# Patient Record
Sex: Male | Born: 1960
Health system: Southern US, Community
[De-identification: ages and names within clinical notes are randomized; demographics above are authoritative.]

## PROBLEM LIST (undated history)

## (undated) DIAGNOSIS — F1990 Other psychoactive substance use, unspecified, uncomplicated: Secondary | ICD-10-CM

## (undated) DIAGNOSIS — I10 Essential (primary) hypertension: Secondary | ICD-10-CM

## (undated) DIAGNOSIS — J9601 Acute respiratory failure with hypoxia: Secondary | ICD-10-CM

## (undated) DIAGNOSIS — R0902 Hypoxemia: Secondary | ICD-10-CM

## (undated) DIAGNOSIS — H269 Unspecified cataract: Secondary | ICD-10-CM

## (undated) DIAGNOSIS — R0600 Dyspnea, unspecified: Secondary | ICD-10-CM

## (undated) DIAGNOSIS — J449 Chronic obstructive pulmonary disease, unspecified: Secondary | ICD-10-CM

## (undated) DIAGNOSIS — J189 Pneumonia, unspecified organism: Secondary | ICD-10-CM

## (undated) DIAGNOSIS — M1990 Unspecified osteoarthritis, unspecified site: Secondary | ICD-10-CM

## (undated) DIAGNOSIS — K219 Gastro-esophageal reflux disease without esophagitis: Secondary | ICD-10-CM

## (undated) DIAGNOSIS — Z720 Tobacco use: Secondary | ICD-10-CM

## (undated) DIAGNOSIS — J439 Emphysema, unspecified: Secondary | ICD-10-CM

## (undated) DIAGNOSIS — F419 Anxiety disorder, unspecified: Secondary | ICD-10-CM

## (undated) HISTORY — DX: Unspecified cataract: H26.9

## (undated) HISTORY — DX: Emphysema, unspecified: J43.9

## (undated) HISTORY — PX: UPPER GASTROINTESTINAL ENDOSCOPY: SHX188

## (undated) HISTORY — PX: CYST EXCISION: SHX5701

## (undated) HISTORY — DX: Other psychoactive substance use, unspecified, uncomplicated: F19.90

## (undated) HISTORY — PX: ESOPHAGEAL DILATION: SHX303

## (undated) HISTORY — DX: Hypoxemia: R09.02

## (undated) HISTORY — PX: OTHER SURGICAL HISTORY: SHX169

## (undated) HISTORY — DX: Gastro-esophageal reflux disease without esophagitis: K21.9

## (undated) HISTORY — PX: ROTATOR CUFF REPAIR: SHX139

## (undated) HISTORY — DX: Pneumonia, unspecified organism: J18.9

## (undated) HISTORY — DX: Tobacco use: Z72.0

## (undated) HISTORY — DX: Acute respiratory failure with hypoxia: J96.01

## (undated) HISTORY — DX: Essential (primary) hypertension: I10

## (undated) HISTORY — PX: BACK SURGERY: SHX140

## (undated) HISTORY — DX: Anxiety disorder, unspecified: F41.9

---

## 2019-01-17 DIAGNOSIS — J441 Chronic obstructive pulmonary disease with (acute) exacerbation: Secondary | ICD-10-CM | POA: Diagnosis not present

## 2019-01-17 DIAGNOSIS — R05 Cough: Secondary | ICD-10-CM | POA: Diagnosis not present

## 2019-01-25 DIAGNOSIS — R0602 Shortness of breath: Secondary | ICD-10-CM | POA: Diagnosis not present

## 2019-01-25 DIAGNOSIS — F1990 Other psychoactive substance use, unspecified, uncomplicated: Secondary | ICD-10-CM | POA: Diagnosis not present

## 2019-01-25 DIAGNOSIS — J189 Pneumonia, unspecified organism: Secondary | ICD-10-CM | POA: Diagnosis not present

## 2019-01-25 DIAGNOSIS — J441 Chronic obstructive pulmonary disease with (acute) exacerbation: Secondary | ICD-10-CM | POA: Diagnosis not present

## 2019-01-25 DIAGNOSIS — R590 Localized enlarged lymph nodes: Secondary | ICD-10-CM | POA: Diagnosis not present

## 2019-01-25 DIAGNOSIS — J9601 Acute respiratory failure with hypoxia: Secondary | ICD-10-CM | POA: Diagnosis not present

## 2019-01-25 DIAGNOSIS — R0902 Hypoxemia: Secondary | ICD-10-CM | POA: Diagnosis not present

## 2019-01-25 DIAGNOSIS — K228 Other specified diseases of esophagus: Secondary | ICD-10-CM | POA: Diagnosis not present

## 2019-01-25 DIAGNOSIS — J440 Chronic obstructive pulmonary disease with acute lower respiratory infection: Secondary | ICD-10-CM | POA: Diagnosis not present

## 2019-01-25 DIAGNOSIS — F17210 Nicotine dependence, cigarettes, uncomplicated: Secondary | ICD-10-CM | POA: Diagnosis not present

## 2019-01-25 DIAGNOSIS — R918 Other nonspecific abnormal finding of lung field: Secondary | ICD-10-CM | POA: Diagnosis not present

## 2019-01-25 DIAGNOSIS — Z23 Encounter for immunization: Secondary | ICD-10-CM | POA: Diagnosis not present

## 2019-01-25 DIAGNOSIS — Z720 Tobacco use: Secondary | ICD-10-CM | POA: Diagnosis not present

## 2019-01-26 DIAGNOSIS — Z720 Tobacco use: Secondary | ICD-10-CM | POA: Diagnosis not present

## 2019-01-26 DIAGNOSIS — J189 Pneumonia, unspecified organism: Secondary | ICD-10-CM | POA: Diagnosis not present

## 2019-01-26 DIAGNOSIS — J9601 Acute respiratory failure with hypoxia: Secondary | ICD-10-CM | POA: Diagnosis not present

## 2019-01-26 DIAGNOSIS — F1990 Other psychoactive substance use, unspecified, uncomplicated: Secondary | ICD-10-CM | POA: Diagnosis not present

## 2019-01-27 DIAGNOSIS — Z720 Tobacco use: Secondary | ICD-10-CM | POA: Diagnosis not present

## 2019-01-27 DIAGNOSIS — F1990 Other psychoactive substance use, unspecified, uncomplicated: Secondary | ICD-10-CM | POA: Diagnosis not present

## 2019-01-27 DIAGNOSIS — J189 Pneumonia, unspecified organism: Secondary | ICD-10-CM | POA: Diagnosis not present

## 2019-01-27 DIAGNOSIS — J9601 Acute respiratory failure with hypoxia: Secondary | ICD-10-CM | POA: Diagnosis not present

## 2019-01-28 DIAGNOSIS — J9601 Acute respiratory failure with hypoxia: Secondary | ICD-10-CM | POA: Diagnosis not present

## 2019-01-28 DIAGNOSIS — J189 Pneumonia, unspecified organism: Secondary | ICD-10-CM | POA: Diagnosis not present

## 2019-01-28 DIAGNOSIS — F1990 Other psychoactive substance use, unspecified, uncomplicated: Secondary | ICD-10-CM | POA: Diagnosis not present

## 2019-01-28 DIAGNOSIS — Z720 Tobacco use: Secondary | ICD-10-CM | POA: Diagnosis not present

## 2019-02-03 ENCOUNTER — Ambulatory Visit: Payer: BLUE CROSS/BLUE SHIELD | Admitting: Emergency Medicine

## 2019-02-03 ENCOUNTER — Encounter: Payer: Self-pay | Admitting: *Deleted

## 2019-02-03 VITALS — BP 118/70 | HR 91 | Ht 71.0 in | Wt 131.8 lb

## 2019-02-03 DIAGNOSIS — Z720 Tobacco use: Secondary | ICD-10-CM | POA: Insufficient documentation

## 2019-02-03 DIAGNOSIS — R590 Localized enlarged lymph nodes: Secondary | ICD-10-CM

## 2019-02-03 DIAGNOSIS — J432 Centrilobular emphysema: Secondary | ICD-10-CM | POA: Insufficient documentation

## 2019-02-03 NOTE — H&P (View-Only) (Signed)
Subjective:    Patient ID: Sean Hull, male    DOB: 1961/09/28, 58 y.o.   MRN: [id number]  HPI 58 year old smoker (40 pack years) with history of substance abuse (cocaine) admitted to [facility] for pneumonia between 2/22 and 2/25.  He was hypoxemic and required BiPAP support, was not intubated.  He was treated with antibiotics and improved, discharged.  A CT scan of his chest was done during that hospitalization which I have reviewed.  This shows profound emphysematous change, bilateral lower lobe predominant infiltrates consistent with either pneumonia or pneumonitis superimposed on his emphysematous disease.  Consider also atypical infection.  He had fairly bulky mediastinal lymphadenopathy principally on the left.  Question was also raised of possible esophageal lesion.  He presents today for further evaluation of his abnormal CT scan of the chest.  He still feels washed out, breathing a bit better. Cough is better. He has lost about 30 lbs in the last year. He has decreased his cigarettes. He has some dysphagia, sometimes food gets stuck. Sometimes hurts to swallow liquids.    Review of Systems  Constitutional: Positive for unexpected weight change. Negative for fever.  HENT: Positive for congestion, sore throat and trouble swallowing. Negative for dental problem, ear pain, nosebleeds, postnasal drip, rhinorrhea, sinus pressure and sneezing.   Eyes: Negative for redness and itching.  Respiratory: Positive for cough and shortness of breath. Negative for chest tightness and wheezing.   Cardiovascular: Negative for palpitations and leg swelling.  Gastrointestinal: Negative for nausea and vomiting.  Genitourinary: Negative for dysuria.  Musculoskeletal: Negative for joint swelling.  Skin: Negative for rash.  Neurological: Negative for headaches.  Hematological: Does not bruise/bleed easily.  Psychiatric/Behavioral: Negative for dysphoric mood. The patient is not nervous/anxious.    Past  Medical History:  Diagnosis Date   Acute respiratory failure with hypoxia (HCC)    CAP (community acquired pneumonia)    Substance use disorder    Tobacco use      No family history on file.   Social History   Socioeconomic History   Marital status: Widowed    Spouse name: Not on file   Number of children: Not on file   Years of education: Not on file   Highest education level: Not on file  Occupational History   Not on file  Social Needs   Financial resource strain: Not on file   Food insecurity:    Worry: Not on file    Inability: Not on file   Transportation needs:    Medical: Not on file    Non-medical: Not on file  Tobacco Use   Smoking status: Current Every Day Smoker    Packs/day: 1.00    Years: 40.00    Pack years: 40.00   Smokeless tobacco: Never Used  Substance and Sexual Activity   Alcohol use: Not on file   Drug use: Yes    Types: "Crack" cocaine   Sexual activity: Not on file  Lifestyle   Physical activity:    Days per week: Not on file    Minutes per session: Not on file   Stress: Not on file  Relationships   Social connections:    Talks on phone: Not on file    Gets together: Not on file    Attends religious service: Not on file    Active member of club or organization: Not on file    Attends meetings of clubs or organizations: Not on file    Relationship status: Not  on file   Intimate partner violence:    Fear of current or ex partner: Not on file    Emotionally abused: Not on file    Physically abused: Not on file    Forced sexual activity: Not on file  Other Topics Concern   Not on file  Social History Narrative   Not on file     No Known Allergies   Outpatient Medications Prior to Visit  Medication Sig Dispense Refill   albuterol (PROVENTIL) (5 MG/ML) 0.5% nebulizer solution DRAW 0.5ML AND INHALE VIA NEBULIZER Q 8 H     amitriptyline (ELAVIL) 100 MG tablet TAKE 1 TO 2 TABLETS BY MOUTH EVERY NIGHTAT BEDTIME      budesonide (PULMICORT) 0.5 MG/2ML nebulizer solution USE 1 VIAL VIA NEB BID FOR 30 DAYS     doxycycline (VIBRAMYCIN) 100 MG capsule      ipratropium-albuterol (DUONEB) 0.5-2.5 (3) MG/3ML SOLN INHALE THE CONTENT OF 1 VIAL VIA NEBULIZER Q 6 H     predniSONE (DELTASONE) 20 MG tablet      PROAIR HFA 108 (90 Base) MCG/ACT inhaler      No facility-administered medications prior to visit.         Objective:   Physical Exam  Vitals:   02/03/19 1507  BP: 118/70  Pulse: 91  SpO2: 97%  Weight: 131 lb 12.8 oz (59.8 kg)  Height: 5\' 11"  (1.803 m)   Gen: Pleasant, very thin, in no distress,  normal affect  ENT: No lesions,  mouth clear,  Some white exudate on uvula, no postnasal drip  Neck: No JVD, no stridor  Lungs: No use of accessory muscles, distant, no wheeze  Cardiovascular: RRR, heart sounds normal, no murmur or gallops, no peripheral edema  Musculoskeletal: No deformities, no cyanosis or clubbing  Neuro: alert, awake, non focal  Skin: Warm, no lesions or rash     Assessment & Plan:  Tobacco abuse Discussed cessation with him today  Mediastinal lymphadenopathy New observation on his recent CT scan of the chest from hospitalization for pneumonia.  The bilateral infiltrates could reflect bacterial pneumonia, possibly an acute lung injury since he was cocaine positive.  The lymphadenopathy appears to be out of proportion to what I would typically see with pneumonia.  I think his nodes need to be sampled with endobronchial ultrasound.  If at all get a diagnosis then I would recommend EGD by gastroenterology given the question of a possible esophageal lesion  Centrilobular emphysema (HCC) He will need pulmonary function testing going forward.  For now I will continue his scheduled DuoNeb  Levy Pupa, MD, PhD 02/03/2019, 3:34 PM [facility] [phone] or if no answer [phone]

## 2019-02-03 NOTE — Progress Notes (Signed)
° °Subjective:  ° ° Patient ID: Sean Hull, male    DOB: 11/09/1961, 58 y.o.   MRN: [id number] ° °HPI °58-year-old smoker (40 pack years) with history of substance abuse (cocaine) admitted to [place] for pneumonia between 2/22 and 2/25.  He was hypoxemic and required BiPAP support, was not intubated.  He was treated with antibiotics and improved, discharged.  A CT scan of his chest was done during that hospitalization which I have reviewed.  This shows profound emphysematous change, bilateral lower lobe predominant infiltrates consistent with either pneumonia or pneumonitis superimposed on his emphysematous disease.  Consider also atypical infection.  He had fairly bulky mediastinal lymphadenopathy principally on the left.  Question was also raised of possible esophageal lesion.  He presents today for further evaluation of his abnormal CT scan of the chest. ° °He still feels washed out, breathing a bit better. Cough is better. He has lost about 30 lbs in the last year. He has decreased his cigarettes. He has some dysphagia, sometimes food gets stuck. Sometimes hurts to swallow liquids.  ° ° °Review of Systems  °Constitutional: Positive for unexpected weight change. Negative for fever.  °HENT: Positive for congestion, sore throat and trouble swallowing. Negative for dental problem, ear pain, nosebleeds, postnasal drip, rhinorrhea, sinus pressure and sneezing.   °Eyes: Negative for redness and itching.  °Respiratory: Positive for cough and shortness of breath. Negative for chest tightness and wheezing.   °Cardiovascular: Negative for palpitations and leg swelling.  °Gastrointestinal: Negative for nausea and vomiting.  °Genitourinary: Negative for dysuria.  °Musculoskeletal: Negative for joint swelling.  °Skin: Negative for rash.  °Neurological: Negative for headaches.  °Hematological: Does not bruise/bleed easily.  °Psychiatric/Behavioral: Negative for dysphoric mood. The patient is not nervous/anxious.   ° °Past  Medical History:  °Diagnosis Date  °• Acute respiratory failure with hypoxia (HCC)   °• CAP (community acquired pneumonia)   °• Substance use disorder   °• Tobacco use   °  ° °No family history on file.  ° °Social History  ° °Socioeconomic History  °• Marital status: Widowed  °  Spouse name: Not on file  °• Number of children: Not on file  °• Years of education: Not on file  °• Highest education level: Not on file  °Occupational History  °• Not on file  °Social Needs  °• Financial resource strain: Not on file  °• Food insecurity:  °  Worry: Not on file  °  Inability: Not on file  °• Transportation needs:  °  Medical: Not on file  °  Non-medical: Not on file  °Tobacco Use  °• Smoking status: Current Every Day Smoker  °  Packs/day: 1.00  °  Years: 40.00  °  Pack years: 40.00  °• Smokeless tobacco: Never Used  °Substance and Sexual Activity  °• Alcohol use: Not on file  °• Drug use: Yes  °  Types: "Crack" cocaine  °• Sexual activity: Not on file  °Lifestyle  °• Physical activity:  °  Days per week: Not on file  °  Minutes per session: Not on file  °• Stress: Not on file  °Relationships  °• Social connections:  °  Talks on phone: Not on file  °  Gets together: Not on file  °  Attends religious service: Not on file  °  Active member of club or organization: Not on file  °  Attends meetings of clubs or organizations: Not on file  °  Relationship status: Not   on file  °• Intimate partner violence:  °  Fear of current or ex partner: Not on file  °  Emotionally abused: Not on file  °  Physically abused: Not on file  °  Forced sexual activity: Not on file  °Other Topics Concern  °• Not on file  °Social History Narrative  °• Not on file  °  ° °No Known Allergies  ° °Outpatient Medications Prior to Visit  °Medication Sig Dispense Refill  °• albuterol (PROVENTIL) (5 MG/ML) 0.5% nebulizer solution DRAW 0.5ML AND INHALE VIA NEBULIZER Q 8 H    °• amitriptyline (ELAVIL) 100 MG tablet TAKE 1 TO 2 TABLETS BY MOUTH EVERY NIGHTAT BEDTIME     °• budesonide (PULMICORT) 0.5 MG/2ML nebulizer solution USE 1 VIAL VIA NEB BID FOR 30 DAYS    °• doxycycline (VIBRAMYCIN) 100 MG capsule     °• ipratropium-albuterol (DUONEB) 0.5-2.5 (3) MG/3ML SOLN INHALE THE CONTENT OF 1 VIAL VIA NEBULIZER Q 6 H    °• predniSONE (DELTASONE) 20 MG tablet     °• PROAIR HFA 108 (90 Base) MCG/ACT inhaler     ° °No facility-administered medications prior to visit.   ° ° ° °   °Objective:  ° Physical Exam ° °Vitals:  ° 02/03/19 1507  °BP: 118/70  °Pulse: 91  °SpO2: 97%  °Weight: 131 lb 12.8 oz (59.8 kg)  °Height: 5' 11" (1.803 m)  ° °Gen: Pleasant, very thin, in no distress,  normal affect ° °ENT: No lesions,  mouth clear,  Some white exudate on uvula, no postnasal drip ° °Neck: No JVD, no stridor ° °Lungs: No use of accessory muscles, distant, no wheeze ° °Cardiovascular: RRR, heart sounds normal, no murmur or gallops, no peripheral edema ° °Musculoskeletal: No deformities, no cyanosis or clubbing ° °Neuro: alert, awake, non focal ° °Skin: Warm, no lesions or rash ° °   °Assessment & Plan:  °Tobacco abuse °Discussed cessation with him today ° °Mediastinal lymphadenopathy °New observation on his recent CT scan of the chest from hospitalization for pneumonia.  The bilateral infiltrates could reflect bacterial pneumonia, possibly an acute lung injury since he was cocaine positive.  The lymphadenopathy appears to be out of proportion to what I would typically see with pneumonia.  I think his nodes need to be sampled with endobronchial ultrasound.  If at all get a diagnosis then I would recommend EGD by gastroenterology given the question of a possible esophageal lesion ° °Centrilobular emphysema (HCC) °He will need pulmonary function testing going forward.  For now I will continue his scheduled DuoNeb ° °Ithzel Fedorchak, MD, PhD °02/03/2019, 3:34 PM °[facility] °[phone] or if no answer [phone] ° °

## 2019-02-03 NOTE — Assessment & Plan Note (Signed)
New observation on his recent CT scan of the chest from hospitalization for pneumonia.  The bilateral infiltrates could reflect bacterial pneumonia, possibly an acute lung injury since he was cocaine positive.  The lymphadenopathy appears to be out of proportion to what I would typically see with pneumonia.  I think his nodes need to be sampled with endobronchial ultrasound.  If at all get a diagnosis then I would recommend EGD by gastroenterology given the question of a possible esophageal lesion

## 2019-02-03 NOTE — Assessment & Plan Note (Signed)
Discussed cessation with him today 

## 2019-02-03 NOTE — Assessment & Plan Note (Signed)
He will need pulmonary function testing going forward.  For now I will continue his scheduled DuoNeb

## 2019-02-03 NOTE — Patient Instructions (Signed)
Please continue nebulized medication as you are currently taking it. We will arrange for a bronchoscopy with endobronchial ultrasound so that we can inspect your airways, inspect your lymph nodes and obtain samples. Work hard on decreasing your smoking.  Your ultimate goal will be to stop altogether Avoid any other inhaled exposures Follow with Dr Delton Coombes in 1 month

## 2019-02-05 DIAGNOSIS — J432 Centrilobular emphysema: Secondary | ICD-10-CM | POA: Diagnosis not present

## 2019-02-05 DIAGNOSIS — B370 Candidal stomatitis: Secondary | ICD-10-CM | POA: Diagnosis not present

## 2019-02-05 DIAGNOSIS — R590 Localized enlarged lymph nodes: Secondary | ICD-10-CM | POA: Diagnosis not present

## 2019-02-06 ENCOUNTER — Telehealth: Payer: Self-pay | Admitting: Emergency Medicine

## 2019-02-06 NOTE — Telephone Encounter (Signed)
Spoke with pt, he would like a note out for work until 03/06/2019. RB can we provide pt with note?    Patient Instructions by Leslye Peer, MD at 02/03/2019 3:00 PM  Author: Leslye Peer, MD Author Type: Physician Filed: 02/03/2019 3:30 PM  Note Status: Signed Cosign: Cosign Not Required Encounter Date: 02/03/2019  Editor: Leslye Peer, MD (Physician)    Please continue nebulized medication as you are currently taking it. We will arrange for a bronchoscopy with endobronchial ultrasound so that we can inspect your airways, inspect your lymph nodes and obtain samples. Work hard on decreasing your smoking.  Your ultimate goal will be to stop altogether Avoid any other inhaled exposures Follow with Dr Delton Coombes in 1 month

## 2019-02-07 NOTE — Pre-Procedure Instructions (Signed)
Sean Hull  02/07/2019      [id number] Rosalita Levan, [place] - [address] AT [facility] [address] [place] [address] Phone: [phone] Fax: [phone]    Your procedure is scheduled on Wednesday 02/12/2019.  Report to [facility] Admitting at 0830 A.M.  Call this number if you have problems the morning of surgery:  [phone]   Remember:  Do not eat or drink after midnight.    Take these medicines the morning of surgery with A SIP OF WATER: Albuterol (Proventil) nebulizer, if needed Budesonide (Pulmicort) nebulizer Clotrimazole (Mycelex) Ipratropium-albuterol (Duoneb) nebulizer, if needed Proair inhaler, if needed  7 days prior to surgery STOP taking any Aspirin (unless otherwise instructed by your surgeon), Aleve, Naproxen, Ibuprofen, Motrin, Advil, Goody's, BC's, all herbal medications, fish oil, and all vitamins.     Do not wear jewelry.  Do not wear lotions, powders, or colognes, or deodorant.  Men may shave face and neck.  Do not bring valuables to the hospital.  [facility] is not responsible for any belongings or valuables.  Contacts, eyeglasses, hearing aids, dentures or bridgework may not be worn into surgery.  Leave your suitcase in the car.  After surgery it may be brought to your room.  For patients admitted to the hospital, discharge time will be determined by your treatment team.  Patients discharged the day of surgery will not be allowed to drive home.   Name and phone number of your driver:    Special instructions:   [facility]- Preparing For Surgery  Before surgery, you can play an important role. Because skin is not sterile, your skin needs to be as free of germs as possible. You can reduce the number of germs on your skin by washing with CHG (chlorahexidine gluconate) Soap before surgery.  CHG is an antiseptic cleaner which kills germs and bonds with the skin to  continue killing germs even after washing.    Oral Hygiene is also important to reduce your risk of infection.  Remember - BRUSH YOUR TEETH THE MORNING OF SURGERY WITH YOUR REGULAR TOOTHPASTE  Please do not use if you have an allergy to CHG or antibacterial soaps. If your skin becomes reddened/irritated stop using the CHG.  Do not shave (including legs and underarms) for at least 48 hours prior to first CHG shower. It is OK to shave your face.  Please follow these instructions carefully.   1. Shower the NIGHT BEFORE SURGERY and the MORNING OF SURGERY with CHG.   2. If you chose to wash your hair, wash your hair first as usual with your normal shampoo.  3. After you shampoo, rinse your hair and body thoroughly to remove the shampoo.  4. Use CHG as you would any other liquid soap. You can apply CHG directly to the skin and wash gently with a scrungie or a clean washcloth.   5. Apply the CHG Soap to your body ONLY FROM THE NECK DOWN.  Do not use on open wounds or open sores. Avoid contact with your eyes, ears, mouth and genitals (private parts). Wash Face and genitals (private parts)  with your normal soap.  6. Wash thoroughly, paying special attention to the area where your surgery will be performed.  7. Thoroughly rinse your body with warm water from the neck down.  8. DO NOT shower/wash with your normal soap after using and rinsing off the CHG  Soap.  9. Pat yourself dry with a CLEAN TOWEL.  10. Wear CLEAN PAJAMAS to bed the night before surgery, wear comfortable clothes the morning of surgery  11. Place CLEAN SHEETS on your bed the night of your first shower and DO NOT SLEEP WITH PETS.    Day of Surgery: Shower as stated above. Do not apply any deodorants/lotions.  Please wear clean clothes to the hospital/surgery center.   Remember to brush your teeth WITH YOUR REGULAR TOOTHPASTE.    Please read over the following fact sheets that you were given. Pain Booklet, Coughing and  Deep Breathing and Surgical Site Infection Prevention

## 2019-02-07 NOTE — Telephone Encounter (Signed)
Pt is calling to f/u with getting his work note CB# [phone]//kob

## 2019-02-07 NOTE — Telephone Encounter (Signed)
Called and spoke with patient advised him that we are waiting on response from RB.    RB please advise, thank you.

## 2019-02-10 ENCOUNTER — Other Ambulatory Visit: Payer: Self-pay

## 2019-02-10 ENCOUNTER — Telehealth: Payer: Self-pay | Admitting: Emergency Medicine

## 2019-02-10 ENCOUNTER — Encounter (HOSPITAL_COMMUNITY): Payer: Self-pay

## 2019-02-10 ENCOUNTER — Encounter (HOSPITAL_COMMUNITY)
Admission: RE | Admit: 2019-02-10 | Discharge: 2019-02-10 | Disposition: A | Discharge status: Home / Self Care | Payer: BLUE CROSS/BLUE SHIELD | Source: Ambulatory Visit | Attending: Emergency Medicine | Admitting: Emergency Medicine

## 2019-02-10 DIAGNOSIS — Z01818 Encounter for other preprocedural examination: Secondary | ICD-10-CM | POA: Diagnosis not present

## 2019-02-10 HISTORY — DX: Dyspnea, unspecified: R06.00

## 2019-02-10 HISTORY — DX: Unspecified osteoarthritis, unspecified site: M19.90

## 2019-02-10 HISTORY — DX: Chronic obstructive pulmonary disease, unspecified: J44.9

## 2019-02-10 LAB — CBC
HCT: 44.1 % (ref 39.0–52.0)
Hemoglobin: 13.8 g/dL (ref 13.0–17.0)
MCH: 31.7 pg (ref 26.0–34.0)
MCHC: 31.3 g/dL (ref 30.0–36.0)
MCV: 101.1 fL — ABNORMAL HIGH (ref 80.0–100.0)
Platelets: 343 10*3/uL (ref 150–400)
RBC: 4.36 MIL/uL (ref 4.22–5.81)
RDW: 13.4 % (ref 11.5–15.5)
WBC: 7.9 10*3/uL (ref 4.0–10.5)
nRBC: 0 % (ref 0.0–0.2)

## 2019-02-10 LAB — COMPREHENSIVE METABOLIC PANEL
ALBUMIN: 3.4 g/dL — AB (ref 3.5–5.0)
ALT: 15 U/L (ref 0–44)
AST: 15 U/L (ref 15–41)
Alkaline Phosphatase: 71 U/L (ref 38–126)
Anion gap: 10 (ref 5–15)
BILIRUBIN TOTAL: 0.6 mg/dL (ref 0.3–1.2)
BUN: 19 mg/dL (ref 6–20)
CO2: 24 mmol/L (ref 22–32)
Calcium: 9.1 mg/dL (ref 8.9–10.3)
Chloride: 105 mmol/L (ref 98–111)
Creatinine, Ser: 0.97 mg/dL (ref 0.61–1.24)
GFR calc Af Amer: >60 mL/min (ref >60)
GFR calc non Af Amer: >60 mL/min (ref >60)
GLUCOSE: 109 mg/dL — AB (ref 70–99)
Potassium: 4.7 mmol/L (ref 3.5–5.1)
Sodium: 139 mmol/L (ref 135–145)
TOTAL PROTEIN: 7.5 g/dL (ref 6.5–8.1)

## 2019-02-10 LAB — APTT: aPTT: 31 seconds (ref 24–36)

## 2019-02-10 LAB — PROTIME-INR
INR: 1 (ref 0.8–1.2)
Prothrombin Time: 13.5 seconds (ref 11.4–15.2)

## 2019-02-10 NOTE — Progress Notes (Addendum)
PCP - i establishing one in [place], was seen by Dr Beatris Si [facility] hospitalization 01/25/2019  Cardiologist - none  Chest x-ray - requested from [facility]  EKG - 23/08/2019- patient reports that he did not have one in [place]. Blood pressure was elevated when checked 3 times in APT visit.  Blood pressure were normal in Dr Kavin Leech office and Dr Eliezer Champagne office.  I spoke with Antionette Poles, PA-C and was instructed to do an EKG.  Stress Test - denies  ECHO - denies  Cardiac Cath - denies  Sleep Study -denies  LABS- CBC PT CMP  ASA- none- I instructed patient to hold Advil  HA1C-NA Fasting Blood Sugar -NA Checks Blood Sugar ___0__ times a day  Anesthesia-Elevated blood pressure in PAT.  I requested records from [facility].  Pt denies having chest pain, sob, or fever at this time. All instructions explained to the pt, with a verbal understanding of the material. Pt agrees to go over the instructions while at home for a better understanding. The opportunity to ask questions was provided.

## 2019-02-10 NOTE — Telephone Encounter (Signed)
Called patients wife unable to reach left message to give us a call back.  

## 2019-02-10 NOTE — Telephone Encounter (Signed)
Called pt and advised message from the provider. Pt understood and verbalized understanding. Nothing further is needed.    

## 2019-02-10 NOTE — Telephone Encounter (Signed)
Pt's sister Sedalia Muta is calling back [phone]

## 2019-02-10 NOTE — Telephone Encounter (Signed)
I'm going to talk to him about this on Wednesday. Unclear to me how long he needs to be out of work.

## 2019-02-10 NOTE — Telephone Encounter (Signed)
Called and spoke with patients sister, she is requesting results. Advised that I did not see that a DPR has been signed by the patient therefore I cannot not give any information out. She will have the patient call back.

## 2019-02-10 NOTE — Progress Notes (Deleted)
PCP -  Cardiologist -  Chest x-ray -  EKG -   Stress Test -  ECHO -   Cardiac Cath -   Sleep Study -  CPAP -   Fasting Blood Sugar -  n/a Checks Blood Sugar _____ times a day-n/a  Blood Thinner Instructions: n/a Aspirin Instructions: n/a  Anesthesia review:   Patient denies shortness of breath, fever, cough and chest pain at PAT appointment  Patient verbalized understanding of instructions that were given to them at the PAT appointment. Patient was also instructed that they will need to review over the PAT instructions again at home before surgery.

## 2019-02-10 NOTE — Telephone Encounter (Signed)
Called patient unable to reach left message to give us a call back.

## 2019-02-10 NOTE — Telephone Encounter (Signed)
Pt's sister is calling back [phone]

## 2019-02-11 NOTE — Anesthesia Preprocedure Evaluation (Addendum)
Anesthesia Evaluation  Patient identified by MRN, date of birth, ID band Patient awake    Reviewed: Allergy & Precautions, NPO status , Patient's Chart, lab work & pertinent test results  Airway Mallampati: II  TM Distance: >3 FB Neck ROM: Full    Dental  (+) Dental Advisory Given   Pulmonary pneumonia, resolved, COPD,  COPD inhaler, Current Smoker,    breath sounds clear to auscultation       Cardiovascular  Rhythm:Regular Rate:Normal     Neuro/Psych    GI/Hepatic (+)     substance abuse  cocaine use,   Endo/Other    Renal/GU      Musculoskeletal  (+) Arthritis ,   Abdominal   Peds  Hematology   Anesthesia Other Findings   Reproductive/Obstetrics                           Anesthesia Physical Anesthesia Plan  ASA: III  Anesthesia Plan: General   Post-op Pain Management:    Induction: Intravenous  PONV Risk Score and Plan: 2 and Treatment may vary due to age or medical condition and Ondansetron  Airway Management Planned: Oral ETT  Additional Equipment: None  Intra-op Plan:   Post-operative Plan: Possible Post-op intubation/ventilation  Informed Consent: I have reviewed the patients History and Physical, chart, labs and discussed the procedure including the risks, benefits and alternatives for the proposed anesthesia with the patient or authorized representative who has indicated his/her understanding and acceptance.     Dental advisory given  Plan Discussed with: CRNA and Anesthesiologist  Anesthesia Plan Comments:       Anesthesia Quick Evaluation

## 2019-02-12 ENCOUNTER — Ambulatory Visit (HOSPITAL_COMMUNITY): Payer: BLUE CROSS/BLUE SHIELD | Admitting: Certified Registered Nurse Anesthetist

## 2019-02-12 ENCOUNTER — Ambulatory Visit (HOSPITAL_COMMUNITY)
Admission: RE | Admit: 2019-02-12 | Discharge: 2019-02-12 | Disposition: A | Discharge status: Home / Self Care | Payer: BLUE CROSS/BLUE SHIELD | Attending: Emergency Medicine | Admitting: Emergency Medicine

## 2019-02-12 ENCOUNTER — Encounter (HOSPITAL_COMMUNITY): Payer: Self-pay | Admitting: Certified Registered Nurse Anesthetist

## 2019-02-12 ENCOUNTER — Other Ambulatory Visit: Payer: Self-pay

## 2019-02-12 ENCOUNTER — Encounter (HOSPITAL_COMMUNITY)
Admission: RE | Disposition: A | Discharge status: Home / Self Care | Payer: Self-pay | Source: Home / Self Care | Attending: Emergency Medicine

## 2019-02-12 ENCOUNTER — Ambulatory Visit (HOSPITAL_COMMUNITY): Payer: BLUE CROSS/BLUE SHIELD | Admitting: Physician Assistant

## 2019-02-12 DIAGNOSIS — R1310 Dysphagia, unspecified: Secondary | ICD-10-CM | POA: Insufficient documentation

## 2019-02-12 DIAGNOSIS — F17210 Nicotine dependence, cigarettes, uncomplicated: Secondary | ICD-10-CM | POA: Diagnosis not present

## 2019-02-12 DIAGNOSIS — R590 Localized enlarged lymph nodes: Secondary | ICD-10-CM

## 2019-02-12 DIAGNOSIS — Z7952 Long term (current) use of systemic steroids: Secondary | ICD-10-CM | POA: Insufficient documentation

## 2019-02-12 DIAGNOSIS — Z79899 Other long term (current) drug therapy: Secondary | ICD-10-CM | POA: Diagnosis not present

## 2019-02-12 DIAGNOSIS — R591 Generalized enlarged lymph nodes: Secondary | ICD-10-CM | POA: Diagnosis not present

## 2019-02-12 DIAGNOSIS — R918 Other nonspecific abnormal finding of lung field: Secondary | ICD-10-CM | POA: Diagnosis not present

## 2019-02-12 DIAGNOSIS — J432 Centrilobular emphysema: Secondary | ICD-10-CM | POA: Insufficient documentation

## 2019-02-12 DIAGNOSIS — R4702 Dysphasia: Secondary | ICD-10-CM | POA: Diagnosis not present

## 2019-02-12 HISTORY — PX: VIDEO BRONCHOSCOPY WITH ENDOBRONCHIAL ULTRASOUND: SHX6177

## 2019-02-12 SURGERY — BRONCHOSCOPY, WITH EBUS
Anesthesia: General | Site: Chest

## 2019-02-12 MED ORDER — ONDANSETRON HCL 4 MG/2ML IJ SOLN
INTRAMUSCULAR | Status: DC | PRN
  Administered 2019-02-12: 4 mg via INTRAVENOUS

## 2019-02-12 MED ORDER — ONDANSETRON HCL 4 MG/2ML IJ SOLN
INTRAMUSCULAR | Status: AC
  Filled 2019-02-12: qty 2

## 2019-02-12 MED ORDER — DEXAMETHASONE SODIUM PHOSPHATE 10 MG/ML IJ SOLN
INTRAMUSCULAR | Status: AC
  Filled 2019-02-12: qty 1

## 2019-02-12 MED ORDER — MIDAZOLAM HCL 2 MG/2ML IJ SOLN
INTRAMUSCULAR | Status: AC
  Filled 2019-02-12: qty 2

## 2019-02-12 MED ORDER — PHENYLEPHRINE HCL 10 MG/ML IJ SOLN
INTRAMUSCULAR | Status: DC | PRN
  Administered 2019-02-12 (×4): 120 ug via INTRAVENOUS

## 2019-02-12 MED ORDER — 0.9 % SODIUM CHLORIDE (POUR BTL) OPTIME
TOPICAL | Status: DC | PRN
  Administered 2019-02-12: 1000 mL

## 2019-02-12 MED ORDER — DEXAMETHASONE SODIUM PHOSPHATE 10 MG/ML IJ SOLN
INTRAMUSCULAR | Status: DC | PRN
  Administered 2019-02-12: 10 mg via INTRAVENOUS

## 2019-02-12 MED ORDER — LIDOCAINE 2% (20 MG/ML) 5 ML SYRINGE
INTRAMUSCULAR | Status: DC | PRN
  Administered 2019-02-12: 60 mg via INTRAVENOUS

## 2019-02-12 MED ORDER — SUGAMMADEX SODIUM 200 MG/2ML IV SOLN
INTRAVENOUS | Status: DC | PRN
  Administered 2019-02-12: 200 mg via INTRAVENOUS

## 2019-02-12 MED ORDER — PHENYLEPHRINE 40 MCG/ML (10ML) SYRINGE FOR IV PUSH (FOR BLOOD PRESSURE SUPPORT)
PREFILLED_SYRINGE | INTRAVENOUS | Status: AC
  Filled 2019-02-12: qty 10

## 2019-02-12 MED ORDER — PROPOFOL 10 MG/ML IV BOLUS
INTRAVENOUS | Status: DC | PRN
  Administered 2019-02-12: 160 mg via INTRAVENOUS

## 2019-02-12 MED ORDER — FENTANYL CITRATE (PF) 250 MCG/5ML IJ SOLN
INTRAMUSCULAR | Status: AC
  Filled 2019-02-12: qty 5

## 2019-02-12 MED ORDER — PROPOFOL 10 MG/ML IV BOLUS
INTRAVENOUS | Status: AC
  Filled 2019-02-12: qty 20

## 2019-02-12 MED ORDER — LACTATED RINGERS IV SOLN
INTRAVENOUS | Status: DC
  Administered 2019-02-12 (×2): via INTRAVENOUS

## 2019-02-12 MED ORDER — ROCURONIUM BROMIDE 50 MG/5ML IV SOSY
PREFILLED_SYRINGE | INTRAVENOUS | Status: AC
  Filled 2019-02-12: qty 5

## 2019-02-12 MED ORDER — MIDAZOLAM HCL 5 MG/5ML IJ SOLN
INTRAMUSCULAR | Status: DC | PRN
  Administered 2019-02-12: 2 mg via INTRAVENOUS

## 2019-02-12 MED ORDER — LIDOCAINE 2% (20 MG/ML) 5 ML SYRINGE
INTRAMUSCULAR | Status: AC
  Filled 2019-02-12: qty 15

## 2019-02-12 MED ORDER — ROCURONIUM BROMIDE 50 MG/5ML IV SOSY
PREFILLED_SYRINGE | INTRAVENOUS | Status: DC | PRN
  Administered 2019-02-12: 50 mg via INTRAVENOUS
  Administered 2019-02-12: 10 mg via INTRAVENOUS
  Administered 2019-02-12: 35 mg via INTRAVENOUS

## 2019-02-12 MED ORDER — FENTANYL CITRATE (PF) 100 MCG/2ML IJ SOLN
INTRAMUSCULAR | Status: DC | PRN
  Administered 2019-02-12: 50 ug via INTRAVENOUS

## 2019-02-12 SURGICAL SUPPLY — 37 items
ADAPTER VALVE BIOPSY EBUS (MISCELLANEOUS) IMPLANT
ADPTR VALVE BIOPSY EBUS (MISCELLANEOUS)
BALLN FOR EBUS SCOPE (BALLOONS) ×4
BALLOON FOR EBUS SCOPE (BALLOONS) IMPLANT
BRUSH CYTOL CELLEBRITY 1.5X140 (MISCELLANEOUS) IMPLANT
CANISTER SUCT 3000ML PPV (MISCELLANEOUS) ×2 IMPLANT
CONT SPEC 4OZ CLIKSEAL STRL BL (MISCELLANEOUS) ×2 IMPLANT
COVER BACK TABLE 60X90IN (DRAPES) ×2 IMPLANT
FORCEPS BIOP RJ4 1.8 (CUTTING FORCEPS) IMPLANT
GAUZE SPONGE 4X4 12PLY STRL (GAUZE/BANDAGES/DRESSINGS) ×2 IMPLANT
GLOVE BIO SURGEON STRL SZ7.5 (GLOVE) ×2 IMPLANT
GOWN STRL REUS W/ TWL LRG LVL3 (GOWN DISPOSABLE) ×1 IMPLANT
GOWN STRL REUS W/TWL LRG LVL3 (GOWN DISPOSABLE) ×1
KIT CLEAN ENDO COMPLIANCE (KITS) ×4 IMPLANT
KIT TURNOVER KIT B (KITS) ×2 IMPLANT
MARKER SKIN DUAL TIP RULER LAB (MISCELLANEOUS) ×2 IMPLANT
NDL ASPIRATION VIZISHOT 19G (NEEDLE) IMPLANT
NDL ASPIRATION VIZISHOT 21G (NEEDLE) IMPLANT
NEEDLE ASPIRATION VIZISHOT 19G (NEEDLE) ×2 IMPLANT
NEEDLE ASPIRATION VIZISHOT 21G (NEEDLE) ×2 IMPLANT
NS IRRIG 1000ML POUR BTL (IV SOLUTION) ×2 IMPLANT
OIL SILICONE PENTAX (PARTS (SERVICE/REPAIRS)) ×2 IMPLANT
PAD ARMBOARD 7.5X6 YLW CONV (MISCELLANEOUS) ×4 IMPLANT
STOPCOCK 4 WAY LG BORE MALE ST (IV SETS) ×1 IMPLANT
SYR 10ML LL (SYRINGE) ×1 IMPLANT
SYR 20CC LL (SYRINGE) ×4 IMPLANT
SYR 20ML ECCENTRIC (SYRINGE) ×3 IMPLANT
SYR 50ML SLIP (SYRINGE) IMPLANT
SYR 5ML LUER SLIP (SYRINGE) ×2 IMPLANT
TOWEL OR 17X24 6PK STRL BLUE (TOWEL DISPOSABLE) ×2 IMPLANT
TRAP SPECIMEN MUCOUS 40CC (MISCELLANEOUS) IMPLANT
TUBE CONNECTING 20X1/4 (TUBING) ×4 IMPLANT
UNDERPAD 30X30 (UNDERPADS AND DIAPERS) ×2 IMPLANT
VALVE BIOPSY  SINGLE USE (MISCELLANEOUS) ×1
VALVE BIOPSY SINGLE USE (MISCELLANEOUS) ×1 IMPLANT
VALVE SUCTION BRONCHIO DISP (MISCELLANEOUS) ×2 IMPLANT
WATER STERILE IRR 1000ML POUR (IV SOLUTION) ×2 IMPLANT

## 2019-02-12 NOTE — Anesthesia Procedure Notes (Signed)
Procedure Name: Intubation Date/Time: 02/12/2019 10:28 AM Performed by: Inda Coke, CRNA Pre-anesthesia Checklist: Patient identified, Emergency Drugs available, Suction available and Patient being monitored Patient Re-evaluated:Patient Re-evaluated prior to induction Oxygen Delivery Method: Circle System Utilized Preoxygenation: Pre-oxygenation with 100% oxygen Induction Type: IV induction Ventilation: Mask ventilation without difficulty Laryngoscope Size: Mac and 4 Grade View: Grade I Tube type: Oral Number of attempts: 1 Airway Equipment and Method: Stylet and Oral airway Placement Confirmation: ETT inserted through vocal cords under direct vision,  positive ETCO2 and breath sounds checked- equal and bilateral Secured at: 22 cm Tube secured with: Tape Dental Injury: Teeth and Oropharynx as per pre-operative assessment

## 2019-02-12 NOTE — Interval H&P Note (Signed)
PCCM Interval Note  Pt presents today for further eval of his B infiltrates, mediastinal LAD, dysphagia.  No new issues reported, stable exertional SOB, minimal cough, continues to have dysphagia  Vitals:   02/12/19 0847  BP: (!) 154/87  Pulse: 78  Resp: 18  Temp: 97.7 F (36.5 C)  TempSrc: Oral  SpO2: 98%  Weight: 60.6 kg  Height: 5\' 11"  (1.803 m)   Labs reviewed and acceptable.  Plan for FOB + EBUS, all questions answered regarding risks and benefits. He understands and elects to proceed. No barriers identified.  If this is non-diagnostic then he will likely need GI referral for EGD.   Levy Pupa, MD, PhD 02/12/2019, 10:11 AM [facility] [phone] or if no answer [phone]

## 2019-02-12 NOTE — Transfer of Care (Signed)
Immediate Anesthesia Transfer of Care Note  Patient: Sean Hull  Procedure(s) Performed: VIDEO BRONCHOSCOPY WITH ENDOBRONCHIAL ULTRASOUND (N/A Chest)  Patient Location: PACU  Anesthesia Type:General  Level of Consciousness: awake, patient cooperative and responds to stimulation  Airway & Oxygen Therapy: Patient Spontanous Breathing and Patient connected to nasal cannula oxygen  Post-op Assessment: Report given to RN and Post -op Vital signs reviewed and stable  Post vital signs: Reviewed and stable  Last Vitals:  Vitals Value Taken Time  BP 128/79 02/12/2019 11:54 AM  Temp    Pulse 90 02/12/2019 11:56 AM  Resp 17 02/12/2019 11:56 AM  SpO2 98 % 02/12/2019 11:56 AM  Vitals shown include unvalidated device data.  Last Pain:  Vitals:   02/12/19 0850  TempSrc:   PainSc: 0-No pain      Patients Stated Pain Goal: 2 (02/12/19 0850)  Complications: No apparent anesthesia complications

## 2019-02-12 NOTE — Anesthesia Postprocedure Evaluation (Signed)
Anesthesia Post Note  Patient: Sean Hull  Procedure(s) Performed: VIDEO BRONCHOSCOPY WITH ENDOBRONCHIAL ULTRASOUND (N/A Chest)     Patient location during evaluation: PACU Anesthesia Type: General Level of consciousness: awake and alert Pain management: pain level controlled Vital Signs Assessment: post-procedure vital signs reviewed and stable Respiratory status: spontaneous breathing, nonlabored ventilation and respiratory function stable Cardiovascular status: blood pressure returned to baseline and stable Postop Assessment: no apparent nausea or vomiting Anesthetic complications: no    Last Vitals:  Vitals:   02/12/19 1254 02/12/19 1300  BP: 133/89   Pulse: 83   Resp: (!) 25   Temp:  36.6 C  SpO2: 93%     Last Pain:  Vitals:   02/12/19 1300  TempSrc:   PainSc: 0-No pain                 Beryle Lathe

## 2019-02-12 NOTE — Discharge Instructions (Signed)
Flexible Bronchoscopy, Care After This sheet gives you information about how to care for yourself after your test. Your doctor may also give you more specific instructions. If you have problems or questions, contact your doctor. Follow these instructions at home: Eating and drinking  Do not eat or drink anything (not even water) for 2 hours after your test, or until your numbing medicine (local anesthetic) wears off.  When your numbness is gone and your cough and gag reflexes have come back, you may: ? Eat only soft foods. ? Slowly drink liquids.  The day after the test, go back to your normal diet. Driving  Do not drive for 24 hours if you were given a medicine to help you relax (sedative).  Do not drive or use heavy machinery while taking prescription pain medicine. General instructions   Take over-the-counter and prescription medicines only as told by your doctor.  Return to your normal activities as told. Ask what activities are safe for you.  Do not use any products that have nicotine or tobacco in them. This includes cigarettes and e-cigarettes. If you need help quitting, ask your doctor.  Keep all follow-up visits as told by your doctor. This is important. It is very important if you had a tissue sample (biopsy) taken. Get help right away if:  You have shortness of breath that gets worse.  You get light-headed.  You feel like you are going to pass out (faint).  You have chest pain.  You cough up: ? More than a little blood. ? More blood than before. Summary  Do not eat or drink anything (not even water) for 2 hours after your test, or until your numbing medicine wears off.  Do not use cigarettes. Do not use e-cigarettes.  Get help right away if you have chest pain.  Please call our office for any questions or concerns.  [phone]   This information is not intended to replace advice given to you by your health care provider. Make sure you discuss any  questions you have with your health care provider. Document Released: 09/17/2009 Document Revised: 12/08/2016 Document Reviewed: 12/08/2016 Elsevier Interactive Patient Education  2019 [employer].

## 2019-02-12 NOTE — Op Note (Signed)
Video Bronchoscopy with Endobronchial Ultrasound Procedure Note  Date of Operation: 02/12/2019  Pre-op Diagnosis: Mediastinal lymphadenopathy, dysphasia  Post-op Diagnosis: Same  Surgeon: Baltazar Apo  Assistants: None  Anesthesia: General endotracheal anesthesia  Operation: Flexible video fiberoptic bronchoscopy with endobronchial ultrasound and biopsies.  Estimated Blood Loss: Minimal  Complications: None apparent  Indications and History: Sean Hull is a 58 y.o. male with history tobacco use, dysphasia, COPD.  He was admitted to [facility] with bilateral basilar pneumonias.  He was found to have mediastinal lymphadenopathy, a possible esophageal lesion.  Recommendation was made to achieve a tissue diagnosis via endobronchial ultrasound with nodal biopsies..  The risks, benefits, complications, treatment options and expected outcomes were discussed with the patient.  The possibilities of pneumothorax, pneumonia, reaction to medication, pulmonary aspiration, perforation of a viscus, bleeding, failure to diagnose a condition and creating a complication requiring transfusion or operation were discussed with the patient who freely signed the consent.    Description of Procedure: The patient was examined in the preoperative area and history and data from the preprocedure consultation were reviewed. It was deemed appropriate to proceed.  The patient was taken to OR10, identified as Sean Hull and the procedure verified as Flexible Video Fiberoptic Bronchoscopy.  A Time Out was held and the above information confirmed. After being taken to the operating room general anesthesia was initiated and the patient  was orally intubated. The video fiberoptic bronchoscope was introduced via the endotracheal tube and a general inspection was performed which showed normal airways throughout.  There were no endobronchial lesions or abnormal secretions seen.  A bronchoalveolar lavage was performed  with 90 cc into the superior segment of the right lower lobe to be sent for cytology and culture.  The standard scope was then withdrawn and the endobronchial ultrasound was used to identify and characterize the peritracheal, hilar and bronchial lymph nodes. Inspection showed enlargement at 4L, 4R, 7. Using real-time ultrasound guidance Wang needle biopsies were take from Station 4L, 4R, 7 nodes and were sent for cytology. The patient tolerated the procedure well without apparent complications. There was no significant blood loss. The bronchoscope was withdrawn. Anesthesia was reversed and the patient was taken to the PACU for recovery.   Samples: 1. Wang needle biopsies from 4L node 2. Wang needle biopsies from 4R node 3. Wang needle biopsies from 7 node 4.  Right lower lobe superior segment BAL  Plans:  The patient will be discharged from the PACU to home when recovered from anesthesia. We will review the cytology, pathology and microbiology results with the patient when they become available. Outpatient followup will be with Dr Lamonte Sakai.    Baltazar Apo, MD, PhD 02/12/2019, 11:48 AM [facility] [phone] or if no answer [phone]

## 2019-02-13 ENCOUNTER — Encounter (HOSPITAL_COMMUNITY): Payer: Self-pay | Admitting: Emergency Medicine

## 2019-02-13 LAB — ACID FAST SMEAR (AFB, MYCOBACTERIA): Acid Fast Smear: NEGATIVE

## 2019-02-14 ENCOUNTER — Telehealth: Payer: Self-pay | Admitting: Emergency Medicine

## 2019-02-14 LAB — CULTURE, RESPIRATORY W GRAM STAIN

## 2019-02-14 LAB — CULTURE, RESPIRATORY

## 2019-02-14 NOTE — Telephone Encounter (Signed)
1 - please let the pt know that I have reviewed his bronch results, that his lymph node biopsies do not show any evidence for cancer > good news. We will need to wait for all of his culture results to return, determine at his next visit when to repeat his CT chest.   2- OK to write a letter that states that my recommendation is that he should not return to work until after his OV on 03/06/2019. Thank you

## 2019-02-14 NOTE — Telephone Encounter (Signed)
Spoke with the pt  He wanted to hear his results again and also let his sister hear them  I advised per RB-  - please let the pt know that I have reviewed his bronch results, that his lymph node biopsies do not show any evidence for cancer > good news. We will need to wait for all of his culture results to return, determine at his next visit when to repeat his CT chest.   They verbalized understanding and nothing further needed

## 2019-02-14 NOTE — Telephone Encounter (Signed)
Spoke with pt, he states he doesn't know why they are calling because he is supposed to waiting on Dr. Delton Coombes to call him with the results today. He also stated he needs a note for work from [employer]. RB please advise.

## 2019-02-14 NOTE — Telephone Encounter (Signed)
Called and spoke with pt letting him know the results of the bronch per RB and stated to him that we were still awaiting the culture results. Also stated to pt that RB would be fine for [place] writing a letter for him to be out of work until after OV 03/06/2019.  Pt expressed understanding. Letter has been typed up for pt and will place it up front for pt to come by and pick up. Pt stated he would be by sometime Monday, 3/16 to pick the letter up. Nothing further needed.

## 2019-03-06 ENCOUNTER — Encounter: Payer: Self-pay | Admitting: Primary Care

## 2019-03-06 ENCOUNTER — Other Ambulatory Visit: Payer: Self-pay

## 2019-03-06 ENCOUNTER — Ambulatory Visit (INDEPENDENT_AMBULATORY_CARE_PROVIDER_SITE_OTHER): Payer: BLUE CROSS/BLUE SHIELD | Admitting: Primary Care

## 2019-03-06 ENCOUNTER — Ambulatory Visit: Payer: Self-pay | Admitting: Emergency Medicine

## 2019-03-06 DIAGNOSIS — R590 Localized enlarged lymph nodes: Secondary | ICD-10-CM

## 2019-03-06 DIAGNOSIS — K229 Disease of esophagus, unspecified: Secondary | ICD-10-CM

## 2019-03-06 DIAGNOSIS — R591 Generalized enlarged lymph nodes: Secondary | ICD-10-CM

## 2019-03-06 DIAGNOSIS — J439 Emphysema, unspecified: Secondary | ICD-10-CM

## 2019-03-06 DIAGNOSIS — J181 Lobar pneumonia, unspecified organism: Secondary | ICD-10-CM

## 2019-03-06 DIAGNOSIS — F17210 Nicotine dependence, cigarettes, uncomplicated: Secondary | ICD-10-CM

## 2019-03-06 DIAGNOSIS — A498 Other bacterial infections of unspecified site: Secondary | ICD-10-CM

## 2019-03-06 DIAGNOSIS — J151 Pneumonia due to Pseudomonas: Secondary | ICD-10-CM

## 2019-03-06 LAB — CULTURE, FUNGUS WITHOUT SMEAR

## 2019-03-06 NOTE — Progress Notes (Addendum)
Virtual Visit via Video Note  I connected with Sean Hull on 03/06/19 at 10:30 AM EDT by a video enabled telemedicine application and verified that I am speaking with the correct person using two identifiers.   I discussed the limitations of evaluation and management by telemedicine and the availability of in person appointments. The patient expressed understanding and agreed to proceed.  Patient at home, agreeing to E-vist. Myself and Sean Hull present on phone call. My nurse lisa to set up follow-up and mail AVS.  History of Present Illness: 58 year old male, current every day smoker (60 pack year hx). PMH significant for centrilobular emphysema, mediastinal lymphadenopathy, substance abuse. Patient of Dr. Delton Coombes, seen for initial consult on 02/03/19.   Previous [place] Encounter: 02/03/19- OV, Dr. Delton Coombes  New observation on his recent CT scan of the chest from hospitalization for pneumonia.  The bilateral infiltrates could reflect bacterial pneumonia, possibly an acute lung injury since he was cocaine positive.  The lymphadenopathy appears to be out of proportion to what I would typically see with pneumonia.  I think his nodes need to be sampled with endobronchial ultrasound.  If at all get a diagnosis then I would recommend EGD by gastroenterology given the question of a possible esophageal lesion  03/06/2019 Still has shortness of breath with activity. Using duoneb 1-2 times a day with a little reported benefit. Still smoking, discussed smoking cessation at length. Asking if he can go back to work still does not feel quite ready. States that he works in a [place] where he does not wear a mask. Denies significant cough or fever  Bronch results reviewed by Dr. Delton Coombes, lymph node biopsies do not show any evidence of cancer. Resp culture grew out pseudomonas aeruginosa from 02/12/19. AFB and fungal culture negative.   Observations/Objective:  - No shortness of breath, wheezing or cough  noted  Assessment and Plan:  Pseudomonas - Resp culture grew out pseudomonas aeruginosa from 02/12/19 - ? New, does not appear to have been treated yet with an abx - Sending in RX cipro 500mg  bid x 10 days (CrCl normal) - Needs sputum recultured in 6-12 months  Abnormal CT chest/mediastinal lymphadenopathy  - Lymph node biopsies do not show any evidence of cancer - Repeat CT chest with contrast in 3 months   Esophageal lesion - Refer to GI for possible EGD   Emphysema - Needs PFTs - Continue Pulmicort scheduled twice daily and duonebs q 6 hours prn sob/wheezing   Tobacco abuse - Discussed smoking cessation at length   Follow Up Instructions:  3-4 months with Dr. Delton Coombes with PFTs   I discussed the assessment and treatment plan with the patient. The patient was provided an opportunity to ask questions and all were answered. The patient agreed with the plan and demonstrated an understanding of the instructions.   The patient was advised to call back or seek an in-person evaluation if the symptoms worsen or if the condition fails to improve as anticipated.  I provided 30 minutes of non-face-to-face time during this encounter.   Glenford Bayley, NP

## 2019-03-06 NOTE — Patient Instructions (Addendum)
Emphysema: Continue Pulmicort nebulizer twice daily scheduled  Use Duoneb (ipratropium bromide/albuterol) every 6 hours as needed for shortness of breath/wheezing  Needs pulmonary function testing   Follow up: 3 months with Dr. Delton Coombes with Pulmonary function testing  Repeat CT chest in 3 months   Referral: GI for possible EGD to evaluate esophageal lesion     Steps to Quit Smoking  Smoking tobacco can be bad for your health. It can also affect almost every organ in your body. Smoking puts you and people around you at risk for many serious long-lasting (chronic) diseases. Quitting smoking is hard, but it is one of the best things that you can do for your health. It is never too late to quit. What are the benefits of quitting smoking? When you quit smoking, you lower your risk for getting serious diseases and conditions. They can include:  Lung cancer or lung disease.  Heart disease.  Stroke.  Heart attack.  Not being able to have children (infertility).  Weak bones (osteoporosis) and broken bones (fractures). If you have coughing, wheezing, and shortness of breath, those symptoms may get better when you quit. You may also get sick less often. If you are pregnant, quitting smoking can help to lower your chances of having a baby of low birth weight. What can I do to help me quit smoking? Talk with your doctor about what can help you quit smoking. Some things you can do (strategies) include:  Quitting smoking totally, instead of slowly cutting back how much you smoke over a period of time.  Going to in-person counseling. You are more likely to quit if you go to many counseling sessions.  Using resources and support systems, such as: ? Agricultural engineer with a Veterinary surgeon. ? Phone quitlines. ? Automotive engineer. ? Support groups or group counseling. ? Text messaging programs. ? Mobile phone apps or applications.  Taking medicines. Some of these medicines may have  nicotine in them. If you are pregnant or breastfeeding, do not take any medicines to quit smoking unless your doctor says it is okay. Talk with your doctor about counseling or other things that can help you. Talk with your doctor about using more than one strategy at the same time, such as taking medicines while you are also going to in-person counseling. This can help make quitting easier. What things can I do to make it easier to quit? Quitting smoking might feel very hard at first, but there is a lot that you can do to make it easier. Take these steps:  Talk to your family and friends. Ask them to support and encourage you.  Call phone quitlines, reach out to support groups, or work with a Veterinary surgeon.  Ask people who smoke to not smoke around you.  Avoid places that make you want (trigger) to smoke, such as: ? Bars. ? Parties. ? Smoke-break areas at work.  Spend time with people who do not smoke.  Lower the stress in your life. Stress can make you want to smoke. Try these things to help your stress: ? Getting regular exercise. ? Deep-breathing exercises. ? Yoga. ? Meditating. ? Doing a body scan. To do this, close your eyes, focus on one area of your body at a time from head to toe, and notice which parts of your body are tense. Try to relax the muscles in those areas.  Download or buy apps on your mobile phone or tablet that can help you stick to your quit plan. There  are many free apps, such as QuitGuide from the [employer] Office manager for Disease Control and Prevention). You can find more support from smokefree.gov and other websites. This information is not intended to replace advice given to you by your health care provider. Make sure you discuss any questions you have with your health care provider. Document Released: 09/16/2009 Document Revised: 07/18/2016 Document Reviewed: 04/06/2015 Elsevier Interactive Patient Education  2019 [employer].

## 2019-03-07 MED ORDER — CIPROFLOXACIN HCL 500 MG PO TABS: 500.0000 mg | ORAL_TABLET | Freq: Two times a day (BID) | ORAL | 0 refills | Status: DC

## 2019-03-07 NOTE — Addendum Note (Signed)
Addended by: Earvin Hansen on: 03/07/2019 03:04 PM   Modules accepted: Orders

## 2019-03-07 NOTE — Addendum Note (Signed)
Addended by: Glenford Bayley on: 03/07/2019 01:44 PM   Modules accepted: Orders

## 2019-03-27 LAB — ACID FAST CULTURE WITH REFLEXED SENSITIVITIES: ACID FAST CULTURE - AFSCU3: NEGATIVE

## 2019-03-27 LAB — ACID FAST CULTURE WITH REFLEXED SENSITIVITIES (MYCOBACTERIA)

## 2019-04-10 ENCOUNTER — Other Ambulatory Visit: Payer: Self-pay

## 2019-04-10 ENCOUNTER — Telehealth (INDEPENDENT_AMBULATORY_CARE_PROVIDER_SITE_OTHER): Payer: BLUE CROSS/BLUE SHIELD | Admitting: Gastroenterology

## 2019-04-10 ENCOUNTER — Encounter: Payer: Self-pay | Admitting: Gastroenterology

## 2019-04-10 VITALS — Ht 72.0 in | Wt 140.0 lb

## 2019-04-10 DIAGNOSIS — K219 Gastro-esophageal reflux disease without esophagitis: Secondary | ICD-10-CM | POA: Diagnosis not present

## 2019-04-10 DIAGNOSIS — R9389 Abnormal findings on diagnostic imaging of other specified body structures: Secondary | ICD-10-CM

## 2019-04-10 DIAGNOSIS — J449 Chronic obstructive pulmonary disease, unspecified: Secondary | ICD-10-CM

## 2019-04-10 DIAGNOSIS — R1310 Dysphagia, unspecified: Secondary | ICD-10-CM

## 2019-04-10 MED ORDER — PANTOPRAZOLE SODIUM 40 MG PO TBEC: 40.0000 mg | DELAYED_RELEASE_TABLET | Freq: Every day | ORAL | 11 refills | Status: DC

## 2019-04-10 NOTE — Progress Notes (Signed)
Chief Complaint: abn CT  Referring Provider:  Glenford Bayley, NP      ASSESSMENT AND PLAN;   #1.  GERD with esophageal dysphagia, abnormal CT chest with ? esophageal lesion (report from [facility] awaited).  #2.  Comorbid conditions including COPD with mediastinal lymphadenopathy (neg LN Bx for malignancy via endobronchial ultrasound 02/2019), continued smoking, H/O substance abuse (assured me that he has quit cocaine 12/2018), pseudomonas pneumonia 12/2018. H/O weight loss is concerning.  Plan: - Protonix  po qd half an hour before breakfast, #30, 11 refills. - Ba swallow with Ba tab ASAP. - EGD with dil (I think he would qualify for [facility], not on home oxygen, has COPD.  Please run it by Cathlyn Parsons CRNA to make sure). - CT chest report from [facility]. - I have instructed patient to chew foods especially meats and breads well and eat slowly.   Addendum: CTA obtained from [facility]-01/23/2019: Negative for PE, severe emphysema, interstitial and nodular airspace disease c/w pneumonia, bulky mediastinal and hilar lymphadenopathy.  Distal third of the esophagus could not be separated from bulky sub-carinal lymphadenopathy and esophageal mass/thickening is difficult to exclude.  Emphysema. HPI:    Sean Hull is a 58 y.o. male  Had pneumonia in setting of COPD January 2020.  CT chest at [facility] showed significant mediastinal lymphadenopathy, questionable lesion in the esophagus (report awaited).  Had pulmonary evaluation including bronchoscopy with endobronchial ultrasound 02/2019 with negative lymph node biopsies for malignancy.  Cultures grew Pseudomonas.  He has been treated with Cipro for 10 days.  Feels better from pulmonary standpoint.  He can breathe much better.  Has been using inhalers as prescribed.  Not on home oxygen.  Continues to smoke despite medical advice.  Sent to the GI clinic for abnormal CT chest and history of longstanding dysphagia.  Had had problems with  dysphagia mostly to solids over the last 10 years, intermittent.  Lately has been getting worse.  Also has problems with liquids at times.  Mostly in the mid chest, associated with heartburn intermittently. No melena or hematochezia.  No nausea/vomiting.  Had EGD with esophageal dilatation over 10 years ago in [place] (records not available).  Did help a little.  Has lost weight - 30lb over 1 year but had pulmonary problems including pneumonia.   Denies having any diarrhea or constipation.  No family history of colon cancer.  Never had colonoscopy done.  He wants to hold off on colonoscopy at the present time.  Past Medical History:  Diagnosis Date  . Acute respiratory failure with hypoxia (HCC)   . Arthritis    hands  . CAP (community acquired pneumonia)   . COPD (chronic obstructive pulmonary disease) (HCC)   . Dyspnea    with exertion  . Pneumonia   . Substance use disorder   . Tobacco use     Past Surgical History:  Procedure Laterality Date  . BACK SURGERY     Disectomy   . CYST EXCISION     left side of face  . ESOPHAGEAL DILATION    . ROTATOR CUFF REPAIR Right     x2  . VIDEO BRONCHOSCOPY WITH ENDOBRONCHIAL ULTRASOUND N/A 02/12/2019   Procedure: VIDEO BRONCHOSCOPY WITH ENDOBRONCHIAL ULTRASOUND;  Surgeon: Leslye Peer, MD;  Location: [facility] OR;  Service: Thoracic;  Laterality: N/A;    Family History  Problem Relation Age of Onset  . Colon cancer Neg Hx   . Esophageal cancer Neg Hx  Social History   Tobacco Use  . Smoking status: Current Every Day Smoker    Packs/day: 1.50    Years: 40.00    Pack years: 60.00  . Smokeless tobacco: Never Used  Substance Use Topics  . Alcohol use: Yes    Alcohol/week: 14.0 standard drinks    Types: 14 Cans of beer per week  . Drug use: Not Currently    Types: "Crack" cocaine    Comment: crack - last time  1 month or so ago- it was days before hospitalization in [place], 01/25/2019.    Current Outpatient Medications   Medication Sig Dispense Refill  . albuterol (PROVENTIL) (2.5 MG/3ML) 0.083% nebulizer solution Take 2.5 mg by nebulization every 6 (six) hours as needed for wheezing or shortness of breath.     . budesonide (PULMICORT) 0.5 MG/2ML nebulizer solution Take 0.5 mg by nebulization 2 (two) times daily.     . clotrimazole (MYCELEX) 10 MG troche Take 10 mg by mouth 2 (two) times daily.    Marland Kitchen. ibuprofen (ADVIL,MOTRIN) 400 MG tablet Take 400 mg by mouth 2 (two) times daily as needed.    Marland Kitchen. ipratropium-albuterol (DUONEB) 0.5-2.5 (3) MG/3ML SOLN Take 3 mLs by nebulization every 6 (six) hours as needed (shortness of breath or wheezing).     Marland Kitchen. PROAIR HFA 108 (90 Base) MCG/ACT inhaler Inhale 1-2 puffs into the lungs every 6 (six) hours as needed for wheezing or shortness of breath.      No current facility-administered medications for this visit.     No Known Allergies  Review of Systems:  Constitutional: Denies fever, chills, diaphoresis, appetite change and fatigue.  HEENT: Denies photophobia, eye pain, redness, hearing loss, ear pain, congestion, sore throat, rhinorrhea, sneezing, mouth sores, neck pain, neck stiffness and tinnitus.   Respiratory: Has SOB, DOE, cough, chest tightness,  and wheezing.  Much better now. Cardiovascular: Denies chest pain, palpitations and leg swelling.  Genitourinary: Denies dysuria, urgency, frequency, hematuria, flank pain and difficulty urinating.  Musculoskeletal: Has myalgias, back pain, joint swelling, arthralgias and gait problem.  Skin: No rash.  Neurological: Denies dizziness, seizures, syncope, weakness, light-headedness, numbness and headaches.  Hematological: Denies adenopathy. Easy bruising, personal or family bleeding history  Psychiatric/Behavioral: Has anxiety, no depression     Physical Exam:    Ht 6' (1.829 m)   Wt 140 lb (63.5 kg)   BMI 18.99 kg/m  Filed Weights   04/10/19 0843  Weight: 140 lb (63.5 kg)   Not examined since it was a tele-visit   Data Reviewed: I have personally reviewed following labs and imaging studies  CBC: CBC Latest Ref Rng & Units 02/10/2019  WBC 4.0 - 10.5 K/uL 7.9  Hemoglobin 13.0 - 17.0 g/dL 16.113.8  Hematocrit 09.639.0 - 52.0 % 44.1  Platelets 150 - 400 K/uL 343    CMP: CMP Latest Ref Rng & Units 02/10/2019  Glucose 70 - 99 mg/dL 045(W109(H)  BUN 6 - 20 mg/dL 19  Creatinine 0.980.61 - 1.191.24 mg/dL 1.470.97  Sodium 829135 - 562145 mmol/L 139  Potassium 3.5 - 5.1 mmol/L 4.7  Chloride 98 - 111 mmol/L 105  CO2 22 - 32 mmol/L 24  Calcium 8.9 - 10.3 mg/dL 9.1  Total Protein 6.5 - 8.1 g/dL 7.5  Total Bilirubin 0.3 - 1.2 mg/dL 0.6  Alkaline Phos 38 - 126 U/L 71  AST 15 - 41 U/L 15  ALT 0 - 44 U/L 15   This service was provided via telemedicine.  The patient was  located at home.  The provider was located in office.  The patient did consent to this telephone visit and is aware of possible charges through their insurance for this visit.  The patient was referred by E. Clent Ridges NP.   Time spent on call/coordination of care/obtaining and review of medical records: 45 min    Edman Circle, MD 04/10/2019, 9:49 AM  Cc: Glenford Bayley, NP

## 2019-04-10 NOTE — Patient Instructions (Signed)
To help prevent the possible spread of infection to our patients, communities, and staff; we will be implementing the following measures:  As of now we are not allowing any visitors/family members to accompany you to any upcoming appointments with [facility]. If you have any concerns about this please contact our office to discuss prior to the appointment.    You have been scheduled for a Barium Esophogram at [facility] (1st floor of the hospital) on May 12th at 9:30. Please arrive 15 minutes prior to your appointment for registration. Make certain not to have anything to eat or drink 3 hours prior to your test. If you need to reschedule for any reason, please contact radiology at [phone] to do so. __________________________________________________________________ A barium swallow is an examination that concentrates on views of the esophagus. This tends to be a double contrast exam (barium and two liquids which, when combined, create a gas to distend the wall of the oesophagus) or single contrast (non-ionic iodine based). The study is usually tailored to your symptoms so a good history is essential. Attention is paid during the study to the form, structure and configuration of the esophagus, looking for functional disorders (such as aspiration, dysphagia, achalasia, motility and reflux) EXAMINATION You may be asked to change into a gown, depending on the type of swallow being performed. A radiologist and radiographer will perform the procedure. The radiologist will advise you of the type of contrast selected for your procedure and direct you during the exam. You will be asked to stand, sit or lie in several different positions and to hold a small amount of fluid in your mouth before being asked to swallow while the imaging is performed .In some instances you may be asked to swallow barium coated marshmallows to assess the motility of a solid food bolus. The exam can be  recorded as a digital or video fluoroscopy procedure. POST PROCEDURE It will take 1-2 days for the barium to pass through your system. To facilitate this, it is important, unless otherwise directed, to increase your fluids for the next 24-48hrs and to resume your normal diet.  This test typically takes about 30 minutes to perform. __________________________________________________________________________________   Sean Hull have been scheduled for an endoscopy. Please follow written instructions given to you at your visit today. If you use inhalers (even only as needed), please bring them with you on the day of your procedure. Your physician has requested that you go to www.startemmi.com and enter the access code given to you at your visit today. This web site gives a general overview about your procedure. However, you should still follow specific instructions given to you by our office regarding your preparation for the procedure.   We have sent the following medications to your pharmacy Protonix

## 2019-04-10 NOTE — Addendum Note (Signed)
Addended by: Alberteen Sam E on: 04/10/2019 02:06 PM   Modules accepted: Orders

## 2019-04-15 ENCOUNTER — Ambulatory Visit (HOSPITAL_COMMUNITY)
Admission: RE | Admit: 2019-04-15 | Discharge: 2019-04-15 | Disposition: A | Discharge status: Home / Self Care | Payer: BLUE CROSS/BLUE SHIELD | Source: Ambulatory Visit | Attending: Gastroenterology | Admitting: Gastroenterology

## 2019-04-15 ENCOUNTER — Other Ambulatory Visit: Payer: Self-pay

## 2019-04-15 DIAGNOSIS — R9389 Abnormal findings on diagnostic imaging of other specified body structures: Secondary | ICD-10-CM | POA: Insufficient documentation

## 2019-04-15 DIAGNOSIS — R1310 Dysphagia, unspecified: Secondary | ICD-10-CM | POA: Insufficient documentation

## 2019-04-15 DIAGNOSIS — J449 Chronic obstructive pulmonary disease, unspecified: Secondary | ICD-10-CM

## 2019-04-15 DIAGNOSIS — K219 Gastro-esophageal reflux disease without esophagitis: Secondary | ICD-10-CM

## 2019-04-16 DIAGNOSIS — H2513 Age-related nuclear cataract, bilateral: Secondary | ICD-10-CM | POA: Diagnosis not present

## 2019-04-18 ENCOUNTER — Telehealth: Payer: Self-pay | Admitting: *Deleted

## 2019-04-18 NOTE — Telephone Encounter (Signed)
Voicemail not set up unable to leave message  

## 2019-04-18 NOTE — Telephone Encounter (Signed)
Voicemail hasn't been set up yet.  Will call back later

## 2019-04-21 ENCOUNTER — Other Ambulatory Visit: Payer: Self-pay

## 2019-04-21 ENCOUNTER — Ambulatory Visit (AMBULATORY_SURGERY_CENTER): Payer: BLUE CROSS/BLUE SHIELD | Admitting: Gastroenterology

## 2019-04-21 ENCOUNTER — Encounter: Payer: Self-pay | Admitting: Gastroenterology

## 2019-04-21 VITALS — BP 144/90 | HR 81 | Temp 98.4°F | Resp 14 | Ht 72.0 in | Wt 140.0 lb

## 2019-04-21 DIAGNOSIS — K228 Other specified diseases of esophagus: Secondary | ICD-10-CM | POA: Diagnosis not present

## 2019-04-21 DIAGNOSIS — B3781 Candidal esophagitis: Secondary | ICD-10-CM

## 2019-04-21 DIAGNOSIS — K219 Gastro-esophageal reflux disease without esophagitis: Secondary | ICD-10-CM

## 2019-04-21 DIAGNOSIS — R1310 Dysphagia, unspecified: Secondary | ICD-10-CM | POA: Diagnosis not present

## 2019-04-21 DIAGNOSIS — K2980 Duodenitis without bleeding: Secondary | ICD-10-CM

## 2019-04-21 DIAGNOSIS — K2970 Gastritis, unspecified, without bleeding: Secondary | ICD-10-CM

## 2019-04-21 DIAGNOSIS — K3189 Other diseases of stomach and duodenum: Secondary | ICD-10-CM | POA: Diagnosis not present

## 2019-04-21 MED ORDER — FLUCONAZOLE 100 MG PO TABS: 100.0000 mg | ORAL_TABLET | Freq: Every day | ORAL | 0 refills | Status: DC

## 2019-04-21 MED ORDER — SODIUM CHLORIDE 0.9 % IV SOLN: 500.0000 mL | Freq: Once | INTRAVENOUS | Status: DC

## 2019-04-21 NOTE — Progress Notes (Signed)
No problems noted in the recovery room. maw 

## 2019-04-21 NOTE — Op Note (Signed)
[facility] Patient Name: Sean ResidesJames Lease Procedure Date: 04/21/2019 12:54 PM MRN: [id number] Endoscopist: Lynann Bolognaajesh Chanson Teems , MD Age: [age] Referring MD:  Date of Birth: 12/13/1960 Gender: Male Account #: [id number] Procedure:                Upper GI endoscopy Indications:              GERD, Dysphagia, negative barium swallow. Abnormal                            CT scan chest showing mediastinal lymphadenopathy,                            thickened lower esophagus. Rule out esophageal                            masses. Medicines:                Monitored Anesthesia Care Procedure:                Pre-Anesthesia Assessment:                           - Prior to the procedure, a History and Physical                            was performed, and patient medications and                            allergies were reviewed. The patient's tolerance of                            previous anesthesia was also reviewed. The risks                            and benefits of the procedure and the sedation                            options and risks were discussed with the patient.                            All questions were answered, and informed consent                            was obtained. Prior Anticoagulants: The patient has                            taken no previous anticoagulant or antiplatelet                            agents. ASA Grade Assessment: II - A patient with                            mild systemic disease. After reviewing the risks  and benefits, the patient was deemed in                            satisfactory condition to undergo the procedure.                           After obtaining informed consent, the endoscope was                            passed under direct vision. Throughout the                            procedure, the patient's blood pressure, pulse, and                            oxygen saturations were monitored continuously. The                            Model GIF-HQ190 [id number]) scope was introduced                            through the and advanced to the. The Endoscope was                            introduced through the and advanced to the second                            part of duodenum. The upper GI endoscopy was                            accomplished without difficulty. The patient                            tolerated the procedure well. Scope In: Scope Out: Findings:                 Patchy, white plaques were found in the lower third                            of the esophagus. Biopsies were taken with a cold                            forceps for histology. Estimated blood loss: none.                            The scope was withdrawn. Dilation was performed                            with a Maloney dilator with no resistance at 50 Fr.                            Estimated blood loss: none. No masses were  visualized.                           The Z-line was regular and was found 38 cm from the                            incisors.                           Diffuse mild inflammation characterized by                            congestion (edema) was found at the body of the                            stomach, antrum and pylorus. The pylorus was widely                            open. Antral scar suggestive of healed gastric                            ulcer. Biopsies were taken with a cold forceps for                            histology. Estimated blood loss: none.                           The examined duodenum was normal. Biopsies were                            taken with a cold forceps for histology. Estimated                            blood loss: none. Complications:            No immediate complications. Estimated Blood Loss:     Estimated blood loss: none. Impression:               -Candida esophagitis (biopsied).                           -Mild gastritis                            -S/p empiric esophageal dilatation. Recommendation:           - Patient has a contact number available for                            emergencies. The signs and symptoms of potential                            delayed complications were discussed with the                            patient. Return to normal activities tomorrow.  Written discharge instructions were provided to the                            patient.                           - Post dilatation diet.                           - Continue Protonix 40 mg p.o. once a day.                           - Await pathology results.                           - Diflucan (fluconazole) 200 mg p.o. x1, followed                            by 100 mg PO daily for 2 weeks.                           - Rinse throat after using any inhalers.                           - Follow-up tele-visit in 4 weeks. Lynann Bologna, MD 04/21/2019 1:28:55 PM This report has been signed electronically.

## 2019-04-21 NOTE — Patient Instructions (Addendum)
YOU HAD AN ENDOSCOPIC PROCEDURE TODAY AT THE  [facility]:   Refer to the procedure report that was given to you for any specific questions about what was found during the examination.  If the procedure report does not answer your questions, please call your gastroenterologist to clarify.  If you requested that your care partner not be given the details of your procedure findings, then the procedure report has been included in a sealed envelope for you to review at your convenience later.  YOU SHOULD EXPECT: Some feelings of bloating in the abdomen. Passage of more gas than usual.  Walking can help get rid of the air that was put into your GI tract during the procedure and reduce the bloating. If you had a lower endoscopy (such as a colonoscopy or flexible sigmoidoscopy) you may notice spotting of blood in your stool or on the toilet paper. If you underwent a bowel prep for your procedure, you may not have a normal bowel movement for a few days.  Please Note:  You might notice some irritation and congestion in your nose or some drainage.  This is from the oxygen used during your procedure.  There is no need for concern and it should clear up in a day or so.  SYMPTOMS TO REPORT IMMEDIATELY:     Following upper endoscopy (EGD)  Vomiting of blood or coffee ground material  New chest pain or pain under the shoulder blades  Painful or persistently difficult swallowing  New shortness of breath  Fever of 100F or higher  Black, tarry-looking stools  For urgent or emergent issues, a gastroenterologist can be reached at any hour by calling [phone].   DIET:  Please follow the dilatation diet the rest of the day.  You were given a handout to follow with your discharge instructions.  Drink plenty of fluids but you should avoid alcoholic beverages for 24 hours.  ACTIVITY:  You should plan to take it easy for the rest of today and you should NOT DRIVE or use heavy machinery until  tomorrow (because of the sedation medicines used during the test).    FOLLOW UP: Our staff will call the number listed on your records 48-72 hours following your procedure to check on you and address any questions or concerns that you may have regarding the information given to you following your procedure. If we do not reach you, we will leave a message.  We will attempt to reach you two times.  During this call, we will ask if you have developed any symptoms of COVID 19. If you develop any symptoms (for example fever, flu-like symptoms, shortness of breath, cough etc.) before then, please call [phone].  If any biopsies were taken you will be contacted by phone or by letter within the next 1-3 weeks.  Please call us at [phone] if you have not heard about the biopsies in 3 weeks.    SIGNATURES/CONFIDENTIALITY: You and/or your care partner have signed paperwork which will be entered into your electronic medical record.  These signatures attest to the fact that that the information above on your After Visit Summary has been reviewed and is understood.  Full responsibility of the confidentiality of this discharge information lies with you and/or your care-partner.    Handouts were iven to your care partner on the dilatation diet to follow the rest of the day and Gastritis Continue taking PROTONIX 40 mg by mouth once a day. Per Dr. Chales Abrahams add DIFLUCAN 200  mg by mouth x 1 day, then 100 mg by mouth for 2 weeks. Rinse throat after using any inhalers. The office will call you with to set up an appointment with Dr. Chales AbrahamsGupta through tele-visit for 4 weeks. You may resume your current medications today. Await biopsy results. Please call if any questions or concerns.

## 2019-04-21 NOTE — Progress Notes (Signed)
Report to PACU, RN, vss, BBS= Clear.  

## 2019-04-21 NOTE — Progress Notes (Signed)
Called to room to assist during endoscopic procedure.  Patient ID and intended procedure confirmed with present staff. Received instructions for my participation in the procedure from the performing physician.  

## 2019-04-23 ENCOUNTER — Telehealth: Payer: Self-pay

## 2019-04-23 NOTE — Telephone Encounter (Signed)
Follow up call made,no answer and voicemail is not set up.

## 2019-04-23 NOTE — Telephone Encounter (Signed)
  Follow up Call-  Call back number 04/21/2019  Post procedure Call Back phone  # [phone]  Permission to leave phone message Yes     Patient questions:  Do you have a fever, pain , or abdominal swelling? No. Pain Score  0 *  Have you tolerated food without any problems? Yes.    Have you been able to return to your normal activities? Yes.    Do you have any questions about your discharge instructions: Diet   No. Medications  No. Follow up visit  No.  Do you have questions or concerns about your Care? No.  Actions: * If pain score is 4 or above: 1. No action needed, pain <4.Have you developed a fever since your procedure? No  2.   Have you had an respiratory symptoms (SOB or cough) since your procedure? no  3.   Have you tested positive for COVID 19 since your procedure no  3.   Have you had any family members/close contacts diagnosed with the COVID 19 since your procedure?  no   If any of these questions are a yes, please inquire if patient has been seen by family doctor and route this note to Laverna Peace, Charity fundraiser.

## 2019-04-24 DIAGNOSIS — H2511 Age-related nuclear cataract, right eye: Secondary | ICD-10-CM | POA: Diagnosis not present

## 2019-04-24 DIAGNOSIS — Z01818 Encounter for other preprocedural examination: Secondary | ICD-10-CM | POA: Diagnosis not present

## 2019-04-24 DIAGNOSIS — H25811 Combined forms of age-related cataract, right eye: Secondary | ICD-10-CM | POA: Diagnosis not present

## 2019-05-01 ENCOUNTER — Encounter: Payer: Self-pay | Admitting: Gastroenterology

## 2019-05-05 DIAGNOSIS — H2512 Age-related nuclear cataract, left eye: Secondary | ICD-10-CM | POA: Diagnosis not present

## 2019-05-05 DIAGNOSIS — H25812 Combined forms of age-related cataract, left eye: Secondary | ICD-10-CM | POA: Diagnosis not present

## 2019-07-08 ENCOUNTER — Ambulatory Visit (INDEPENDENT_AMBULATORY_CARE_PROVIDER_SITE_OTHER)
Admission: RE | Admit: 2019-07-08 | Discharge: 2019-07-08 | Disposition: A | Discharge status: Home / Self Care | Payer: BC Managed Care – PPO | Source: Ambulatory Visit | Attending: Primary Care | Admitting: Primary Care

## 2019-07-08 ENCOUNTER — Other Ambulatory Visit: Payer: Self-pay

## 2019-07-08 DIAGNOSIS — J189 Pneumonia, unspecified organism: Secondary | ICD-10-CM | POA: Diagnosis not present

## 2019-07-08 DIAGNOSIS — J439 Emphysema, unspecified: Secondary | ICD-10-CM | POA: Diagnosis not present

## 2019-07-08 DIAGNOSIS — J181 Lobar pneumonia, unspecified organism: Secondary | ICD-10-CM

## 2019-07-09 ENCOUNTER — Other Ambulatory Visit: Payer: Self-pay | Admitting: *Deleted

## 2019-07-09 ENCOUNTER — Telehealth: Payer: Self-pay

## 2019-07-09 DIAGNOSIS — R911 Solitary pulmonary nodule: Secondary | ICD-10-CM

## 2019-07-09 NOTE — Telephone Encounter (Signed)
-----   Message from Martyn Ehrich, NP sent at 07/09/2019  9:15 AM EDT ----- Please let patient know CT chest showed resolution of previous pneumonia and mediastinal and hilar adenopathy. Severe emphysematous changes. Persistent 87mm RLL lung nodules, needs CT chest wo contrast in 6 months (please order).

## 2019-07-09 NOTE — Telephone Encounter (Signed)
Pt returning missed call for ct results

## 2019-07-09 NOTE — Telephone Encounter (Signed)
Returned call to patient with CT results as noted by B. Volanda Napoleon, NP.  Patient acknowledged understanding with no further questions.  Patient stated he has had increasing SOB recently and back pain. No fever.  Offered virtual visit but patient prefers phone visit tomorrow.  Scheduled with B. Volanda Napoleon, NP for 07/10/19 0930.  Nothing further needed.

## 2019-07-09 NOTE — Progress Notes (Signed)
ct 

## 2019-07-09 NOTE — Telephone Encounter (Signed)
Attempted to call with CT results.  NO answer and no voicemail.  Will try call again later.

## 2019-07-10 ENCOUNTER — Other Ambulatory Visit: Payer: Self-pay

## 2019-07-10 ENCOUNTER — Ambulatory Visit (INDEPENDENT_AMBULATORY_CARE_PROVIDER_SITE_OTHER): Payer: BC Managed Care – PPO | Admitting: Primary Care

## 2019-07-10 ENCOUNTER — Encounter: Payer: Self-pay | Admitting: Primary Care

## 2019-07-10 DIAGNOSIS — J432 Centrilobular emphysema: Secondary | ICD-10-CM

## 2019-07-10 DIAGNOSIS — R911 Solitary pulmonary nodule: Secondary | ICD-10-CM | POA: Diagnosis not present

## 2019-07-10 MED ORDER — TRELEGY ELLIPTA 100-62.5-25 MCG/INH IN AEPB: 1.0000 | INHALATION_SPRAY | Freq: Every day | RESPIRATORY_TRACT | 0 refills | Status: AC

## 2019-07-10 NOTE — Progress Notes (Signed)
Virtual Visit via Telephone Note  I connected with Sean Hull on 07/10/19 at  9:30 AM EDT by telephone and verified that I am speaking with the correct person using two identifiers.  Location: Patient: Home Provider: Office   I discussed the limitations, risks, security and privacy concerns of performing an evaluation and management service by telephone and the availability of in person appointments. I also discussed with the patient that there may be a patient responsible charge related to this service. The patient expressed understanding and agreed to proceed.   History of Present Illness: 58 year old male, current every day smoker (60 pack year hx). PMH significant for centrilobular emphysema, mediastinal lymphadenopathy, substance abuse. Patient of Dr. Lamonte Sakai, seen for initial consult on 02/03/19.   Previous [place] Encounter: 02/03/19- OV, Dr. Lamonte Sakai  New observation on his recent CT scan of the chest from hospitalization for pneumonia.  The bilateral infiltrates could reflect bacterial pneumonia, possibly an acute lung injury since he was cocaine positive.  The lymphadenopathy appears to be out of proportion to what I would typically see with pneumonia.  I think his nodes need to be sampled with endobronchial ultrasound.  If at all get a diagnosis then I would recommend EGD by gastroenterology given the question of a possible esophageal lesion  03/06/2019 Still has shortness of breath with activity. Using duoneb 1-2 times a day with a little reported benefit. Still smoking, discussed smoking cessation at length. Asking if he can go back to work still does not feel quite ready. States that he works in a [place] where he does not wear a mask. Denies significant cough or fever. Bronch results reviewed by Dr. Lamonte Sakai, lymph node biopsies do not show any evidence of cancer. Resp culture grew out pseudomonas aeruginosa from 02/12/19. AFB and fungal culture negative.   07/10/2019 Patient contacted today for  televisit with complaints of shortness of breath. He has been short of breath since February. Feels his breathing is getting worse. Currently using Pulmicort nebulizer twice daily and duonebs as needed. He does not have much of a cough, little congestion which is clear. CT chest on 07/08/19 showed resolution of previous pneumonia and mediastinal and hilar adenopathy. Severe emphysematous changes. Persistent 75mm RLL lung nodules, needs CT chest wo contrast in 6 months.     Observations/Objective:  - Mild shortness of breath. No cough or wheezing  Assessment and Plan:  Centrilobular emphysema  - Progressive shortness of breath since February   - CT chest with severe emphysematous changes  - Stop Pulmicort nebulizer - Start TRELEGY inahler - 1 puff daily - Use ipratropium-albuterol nebulizer every 6 hours as needed for breakthrough shortness of breath/wheezing   Solitary pulmonary nodule - CT chest on 07/08/19 showed persistent 87mm RLL lung nodule, needs CT chest wo contrast in 6 months.   Follow Up Instructions:  Follow up in 4 weeks with NP   I discussed the assessment and treatment plan with the patient. The patient was provided an opportunity to ask questions and all were answered. The patient agreed with the plan and demonstrated an understanding of the instructions.   The patient was advised to call back or seek an in-person evaluation if the symptoms worsen or if the condition fails to improve as anticipated.  I provided 22 minutes of non-face-to-face time during this encounter.   Martyn Ehrich, NP

## 2019-07-10 NOTE — Patient Instructions (Addendum)
Stop Pulmicort nebulizer  Start TRELEGY inahler - 1 puff daily  Use ipratropium-albuterol nebulizer every 6 hours as needed for breakthrough shortness of breath/wheezing   Follow up in 4 weeks with NP

## 2019-08-07 ENCOUNTER — Ambulatory Visit: Payer: BC Managed Care – PPO | Admitting: Primary Care

## 2019-08-12 ENCOUNTER — Ambulatory Visit: Payer: BC Managed Care – PPO | Admitting: Primary Care

## 2019-09-02 ENCOUNTER — Telehealth: Payer: Self-pay | Admitting: Primary Care

## 2019-09-02 NOTE — Telephone Encounter (Signed)
Samples and AVS never picked up by patient.  ATC patient unable to reach unable to leave voicemail.  Will mail out AVS but return samples to shelf.

## 2019-10-13 ENCOUNTER — Ambulatory Visit: Payer: BC Managed Care – PPO | Admitting: Pulmonary Disease

## 2019-10-13 ENCOUNTER — Other Ambulatory Visit: Payer: Self-pay

## 2019-10-13 ENCOUNTER — Encounter: Payer: Self-pay | Admitting: Pulmonary Disease

## 2019-10-13 ENCOUNTER — Telehealth: Payer: Self-pay | Admitting: Pulmonary Disease

## 2019-10-13 ENCOUNTER — Ambulatory Visit (INDEPENDENT_AMBULATORY_CARE_PROVIDER_SITE_OTHER): Payer: BC Managed Care – PPO

## 2019-10-13 ENCOUNTER — Telehealth: Payer: Self-pay | Admitting: Emergency Medicine

## 2019-10-13 VITALS — BP 128/88 | HR 87 | Temp 97.2°F | Ht 72.0 in | Wt 135.0 lb

## 2019-10-13 DIAGNOSIS — J432 Centrilobular emphysema: Secondary | ICD-10-CM | POA: Diagnosis not present

## 2019-10-13 DIAGNOSIS — A498 Other bacterial infections of unspecified site: Secondary | ICD-10-CM

## 2019-10-13 DIAGNOSIS — R05 Cough: Secondary | ICD-10-CM | POA: Diagnosis not present

## 2019-10-13 DIAGNOSIS — Z720 Tobacco use: Secondary | ICD-10-CM

## 2019-10-13 DIAGNOSIS — F17210 Nicotine dependence, cigarettes, uncomplicated: Secondary | ICD-10-CM

## 2019-10-13 DIAGNOSIS — Z79899 Other long term (current) drug therapy: Secondary | ICD-10-CM | POA: Diagnosis not present

## 2019-10-13 MED ORDER — DOXYCYCLINE HYCLATE 100 MG PO TABS: 100.0000 mg | ORAL_TABLET | Freq: Two times a day (BID) | ORAL | 0 refills | Status: DC

## 2019-10-13 MED ORDER — TRELEGY ELLIPTA 100-62.5-25 MCG/INH IN AEPB: 1.0000 | INHALATION_SPRAY | Freq: Every day | RESPIRATORY_TRACT | 0 refills | Status: DC

## 2019-10-13 MED ORDER — PREDNISONE 10 MG PO TABS: ORAL_TABLET | ORAL | 0 refills | Status: DC

## 2019-10-13 MED ORDER — METHYLPREDNISOLONE ACETATE 80 MG/ML IJ SUSP
80.0000 mg | Freq: Once | INTRAMUSCULAR | Status: AC
  Administered 2019-10-13: 80 mg via INTRAMUSCULAR

## 2019-10-13 NOTE — Progress Notes (Signed)
Pt aware results of cxr. Nothing further needed.

## 2019-10-13 NOTE — Assessment & Plan Note (Signed)
Patient with clear productive sputum He reports it is a different sputum color than when he had previously had pneumonia which was positive in the BAL for Pseudomonas  Plan: We will monitor patient clinically If symptoms or not improving despite doxycycline use may need to consider Levaquin

## 2019-10-13 NOTE — Telephone Encounter (Signed)
Call returned to patient, reports increased SOB and wheezing. Audibly wheezing on phone. Reports he has been using his nebulizer and remains SOB. Reports symptoms started last Monday and he was trying to maintain at home but he was unable to do so. Covid Screen negative. Appt made. Nothing further needed at this time.

## 2019-10-13 NOTE — Progress Notes (Signed)
_0  ID: Sean Hull, male    DOB: [birth year], 58 y.o.   MRN: [id number]  Chief Complaint  Patient presents with  . Acute Visit    Patient states he has been wheezing since last Monday. Has a productive cough with thick white phlegm. Denies any body aches or chills.     Referring provider: No ref. provider found  HPI:  58 year old male current everyday smoker initially referred to our office in March/2020.  He is followed in our office for emphysema and mediastinal lymphadenopathy  PMH: Substance abuse Smoker/ Smoking History: Current everyday smoker. 0.5ppd. 60 pack years.  Maintenance: Pulmicort nebs, DuoNebs Pt of: Dr. Lamonte Sakai  10/13/2019  - Visit   58 year old male current everyday smoker presenting to our office today as a an office evaluation for acute worsening shortness of breath.  Patient does continue to smoke half a pack per day.  He has had 1 week of worsened increased shortness of breath, wheezing and congestion.  He reports that he is adherent to his Pulmicort nebulized meds as well as his DuoNeb nebulized meds.  He has never used an inhaler before.  Patient also admits that he continues to have increased weight loss over the last 1/2 years.  We are following the patient with CT imaging his last CT being in August/2020.  It showed a persistent 9 mm nodule.  We are planning on repeating this in 6 months. Patient has a productive cough with clear mucus.  He does feel that he is making more mucus than usual.  He has had increased fatigue.  He is exceptionally short of breath.  Patient has not had pulmonary function testing before  Questionaires / Pulmonary Flowsheets:   MMRC: mMRC Dyspnea Scale mMRC Score  10/13/2019 3    Tests:   02/12/2019 -EBUS, bronchoscopy Specimen 1 of 4, no malignant cells identified Fine-needle aspiration, EBUS 4R node, specimen 2 of 4, no malignant cells identified, and lymphoid tissue present Fine-needle aspiration endoscopic specimen  EBUS 7 node specimen 3 of 4, no malignant cells identified, and lymphoid tissue present Bronchial lavage specimen right lower lobe specimen 404, no malignant cells identified BAL No fungus isolated after 21 days BAL-Pseudomonas [name], pansensitive AFB-negative  07/08/2019-CT chest without contrast-resolution of previously demonstrated bilateral pneumonia, persistent but stable 9 mm pulmonary nodule right lung base, a 6 to 65-monthfollow-up CT is recommended to confirm stability, severe emphysematous changes   FENO:  No results found for: NITRICOXIDE  PFT: No flowsheet data found.  WALK:  No flowsheet data found.  Imaging: Dg Chest 2 View  Result Date: 10/13/2019 CLINICAL DATA:  Cough EXAM: CHEST - 2 VIEW COMPARISON:  01/25/2019 FINDINGS: Normal heart size, mediastinal contours, and pulmonary vascularity. Emphysematous changes and minimal central peribronchial thickening consistent with COPD. Resolution of previously identified pulmonary infiltrates. No acute infiltrate, pleural effusion or pneumothorax. Bones unremarkable. IMPRESSION: COPD changes without acute abnormalities Electronically Signed   By: MLavonia DanaM.D.   On: 10/13/2019 12:43    Lab Results:  CBC    Component Value Date/Time   WBC 7.9 02/10/2019 1138   RBC 4.36 02/10/2019 1138   HGB 13.8 02/10/2019 1138   HCT 44.1 02/10/2019 1138   PLT 343 02/10/2019 1138   MCV 101.1 (H) 02/10/2019 1138   MCH 31.7 02/10/2019 1138   MCHC 31.3 02/10/2019 1138   RDW 13.4 02/10/2019 1138    BMET    Component Value Date/Time   NA 139 02/10/2019 1138   K  4.7 02/10/2019 1138   CL 105 02/10/2019 1138   CO2 24 02/10/2019 1138   GLUCOSE 109 (H) 02/10/2019 1138   BUN 19 02/10/2019 1138   CREATININE 0.97 02/10/2019 1138   CALCIUM 9.1 02/10/2019 1138   GFRNONAA >60 02/10/2019 1138   GFRAA >60 02/10/2019 1138    BNP No results found for: BNP  ProBNP No results found for: PROBNP  Specialty Problems      Pulmonary  Problems   Centrilobular emphysema (HCC)      No Known Allergies   There is no immunization history on file for this patient.  We will need to evaluate at next office visit  Past Medical History:  Diagnosis Date  . Acute respiratory failure with hypoxia ([place])   . Arthritis    hands  . CAP (community acquired pneumonia)   . COPD (chronic obstructive pulmonary disease) ([place])   . Dyspnea    with exertion  . Pneumonia   . Substance use disorder   . Tobacco use     Tobacco History: Social History   Tobacco Use  Smoking Status Current Every Day Smoker  . Packs/day: 1.50  . Years: 40.00  . Pack years: 60.00  Smokeless Tobacco Never Used  Tobacco Comment   07/10/19 cigs 1/2 PPD   Ready to quit: Yes Counseling given: Yes Comment: 07/10/19 cigs 1/2 PPD   Smoking assessment and cessation counseling  Patient currently smoking: 0.5 ppd I have advised the patient to quit/stop smoking as soon as possible due to high risk for multiple medical problems.  It will also be very difficult for [place] to manage patient's  respiratory symptoms and status if we continue to expose her lungs to a known irritant.  We do not advise e-cigarettes as a form of stopping smoking.  Patient is  willing to quit smoking.  Patient has not set a quit date but he is willing to discuss this after he gets over his likely COPD exacerbation which he is being treated for today.  I have advised the patient that we can assist and have options of nicotine replacement therapy, provided smoking cessation education today, provided smoking cessation counseling, and provided cessation resources.  Follow-up next office visit office visit for assessment of smoking cessation.    Smoking cessation counseling advised for: 5 min     Outpatient Encounter Medications as of 10/13/2019  Medication Sig  . budesonide (PULMICORT) 0.5 MG/2ML nebulizer solution Take 0.5 mg by nebulization 2 (two) times daily.   Marland Kitchen ibuprofen  (ADVIL,MOTRIN) 400 MG tablet Take 400 mg by mouth 2 (two) times daily as needed.  Marland Kitchen ipratropium-albuterol (DUONEB) 0.5-2.5 (3) MG/3ML SOLN Take 3 mLs by nebulization every 6 (six) hours as needed (shortness of breath or wheezing).   Marland Kitchen PROAIR HFA 108 (90 Base) MCG/ACT inhaler Inhale 1-2 puffs into the lungs every 6 (six) hours as needed for wheezing or shortness of breath.   . [DISCONTINUED] albuterol (PROVENTIL) (2.5 MG/3ML) 0.083% nebulizer solution Take 2.5 mg by nebulization every 6 (six) hours as needed for wheezing or shortness of breath.   . doxycycline (VIBRA-TABS) 100 MG tablet Take 1 tablet (100 mg total) by mouth 2 (two) times daily.  . predniSONE (DELTASONE) 10 MG tablet 4 tabs for 2 days, then 3 tabs for 2 days, 2 tabs for 2 days, then 1 tab for 2 days, then stop  . [DISCONTINUED] pantoprazole (PROTONIX) 40 MG tablet Take 1 tablet (40 mg total) by mouth daily. Take 1 tablet (40  mg total) by mouth daily. Take 1/2 hour before breakfast (Patient not taking: Reported on 07/10/2019)   No facility-administered encounter medications on file as of 10/13/2019.      Review of Systems  Review of Systems  Constitutional: Positive for fatigue. Negative for activity change, diaphoresis and fever.  HENT: Positive for congestion. Negative for postnasal drip, sneezing, sore throat and trouble swallowing.   Eyes: Negative.   Respiratory: Positive for cough, shortness of breath and wheezing.   Cardiovascular: Negative for chest pain and palpitations.  Gastrointestinal: Negative for diarrhea, nausea and vomiting.  Endocrine: Negative.   Genitourinary: Negative.   Musculoskeletal: Negative.  Negative for arthralgias.  Skin: Negative.   Allergic/Immunologic: Negative.   Neurological: Negative.   Hematological: Negative.   Psychiatric/Behavioral: Negative.  Negative for dysphoric mood. The patient is not nervous/anxious.      Physical Exam  BP 128/88   Pulse 87   Temp (!) 97.2 F (36.2 C)  (Temporal)   Ht 6' (1.829 m)   Wt 135 lb (61.2 kg)   SpO2 98%   BMI 18.31 kg/m   Wt Readings from Last 5 Encounters:  10/13/19 135 lb (61.2 kg)  04/21/19 140 lb (63.5 kg)  04/10/19 140 lb (63.5 kg)  02/12/19 133 lb 11.2 oz (60.6 kg)  02/10/19 133 lb 11.2 oz (60.6 kg)    BMI Readings from Last 5 Encounters:  10/13/19 18.31 kg/m  04/21/19 18.99 kg/m  04/10/19 18.99 kg/m  02/12/19 18.65 kg/m  02/10/19 18.65 kg/m     Physical Exam Vitals signs and nursing note reviewed.  Constitutional:      General: He is not in acute distress.    Appearance: He is underweight. He is ill-appearing.     Comments: Thin frail adult male  HENT:     Head: Normocephalic and atraumatic.     Right Ear: Hearing, tympanic membrane, ear canal and external ear normal.     Left Ear: Hearing, tympanic membrane, ear canal and external ear normal.     Nose: Congestion and rhinorrhea present. No mucosal edema.     Right Turbinates: Not enlarged.     Left Turbinates: Not enlarged.     Mouth/Throat:     Mouth: Mucous membranes are dry.     Pharynx: Oropharynx is clear. No oropharyngeal exudate.  Eyes:     Pupils: Pupils are equal, round, and reactive to light.  Neck:     Musculoskeletal: Normal range of motion.  Cardiovascular:     Rate and Rhythm: Normal rate and regular rhythm.     Pulses: Normal pulses.     Heart sounds: Normal heart sounds. No murmur.  Pulmonary:     Effort: Pulmonary effort is normal.     Breath sounds: Wheezing and rhonchi present. No decreased breath sounds or rales.     Comments: Barrel chest Musculoskeletal:     Right lower leg: No edema.     Left lower leg: No edema.  Lymphadenopathy:     Cervical: No cervical adenopathy.  Skin:    General: Skin is warm and dry.     Capillary Refill: Capillary refill takes less than 2 seconds.     Findings: No erythema or rash.  Neurological:     General: No focal deficit present.     Mental Status: He is alert and oriented to  person, place, and time.     Motor: No weakness.     Coordination: Coordination normal.     Gait: Gait is  intact. Gait normal.  Psychiatric:        Mood and Affect: Mood normal.        Behavior: Behavior normal. Behavior is cooperative.        Thought Content: Thought content normal.        Judgment: Judgment normal.       Assessment & Plan:   This is been a difficult interval for the patient.  I am concerned about him.  I have explained that to him in office.  The fact that he continues to smoke despite these acute symptoms is most concerning.  We will treat him today as a COPD exacerbation.  I emphasized the importance of the patient to stop smoking.  He agrees.  He is interested in discussing this.  Since patient is having clear sputum today will treat with doxycycline instead of a fluoroquinolone.  We will obtain a chest x-ray today.  I have contacted the patient's sister and left a detailed message on behalf of the patient were going over patient's plan of care.  This was requested by the patient.  Patient needs flu vaccine and Pneumovax 23 at next office visit if symptomatically better  Centrilobular emphysema ([place]) Plan: Doxycycline today Depo 80, start prednisone taper tomorrow Chest x-ray today Stop Pulmicort nebs Start Trelegy Ellipta, sample provided today Emphasized need for the patient to stop smoking We will schedule close follow-up in office with a smoking cessation visit with the clinical pharmacy team in 2 weeks  Pseudomonas aeruginosa infection Patient with clear productive sputum He reports it is a different sputum color than when he had previously had pneumonia which was positive in the BAL for Pseudomonas  Plan: We will monitor patient clinically If symptoms or not improving despite doxycycline use may need to consider Levaquin  Tobacco abuse Plan: Emphasized need for the patient to stop smoking We will set up smoking cessation visit with clinical  pharmacy team in 2 weeks with next office visit Repeat CT of chest in April/2021 Consider referral to lung cancer screening program in the future after repeat CT imaging  Medication management Plan: Stop Pulmicort nebs Start Trelegy Ellipta 2-week follow-up in office with clinical pharmacist visit where patient can have inhaler teaching    Return in about 2 weeks (around 10/27/2019), or if symptoms worsen or fail to improve, for Follow up with Wyn Quaker FNP-C.   Lauraine Rinne, NP 10/13/2019   This appointment was 42 minutes long with over 50% of the time in direct face-to-face patient care, assessment, plan of care, and follow-up.

## 2019-10-13 NOTE — Progress Notes (Signed)
Patient seen in the office today and instructed on use of Trelegy 100.  Patient expressed understanding and demonstrated technique.  Benetta Spar [facility] 10/13/2019

## 2019-10-13 NOTE — Assessment & Plan Note (Signed)
Plan: Stop Pulmicort nebs Start Trelegy Ellipta 2-week follow-up in office with clinical pharmacist visit where patient can have inhaler teaching

## 2019-10-13 NOTE — Addendum Note (Signed)
Addended by: Valerie Salts on: 10/13/2019 01:35 PM   Modules accepted: Orders

## 2019-10-13 NOTE — Progress Notes (Signed)
Chest x-ray clear for pneumonia.  This is good news.  Proceed forward with medications as prescribed.  Please keep follow-up with our office.  If symptoms are not improving please contact our office.  Wyn Quaker FNP

## 2019-10-13 NOTE — Assessment & Plan Note (Signed)
Plan: Doxycycline today Depo 80, start prednisone taper tomorrow Chest x-ray today Stop Pulmicort nebs Start Trelegy Ellipta, sample provided today Emphasized need for the patient to stop smoking We will schedule close follow-up in office with a smoking cessation visit with the clinical pharmacy team in 2 weeks

## 2019-10-13 NOTE — Assessment & Plan Note (Signed)
Plan: Emphasized need for the patient to stop smoking We will set up smoking cessation visit with clinical pharmacy team in 2 weeks with next office visit Repeat CT of chest in April/2021 Consider referral to lung cancer screening program in the future after repeat CT imaging

## 2019-10-13 NOTE — Patient Instructions (Addendum)
You were seen today by Coral Ceo, NP  for:   1. Centrilobular emphysema (HCC)  Depo 80 today  - doxycycline (VIBRA-TABS) 100 MG tablet; Take 1 tablet (100 mg total) by mouth 2 (two) times daily.  Dispense: 14 tablet; Refill: 0 - predniSONE (DELTASONE) 10 MG tablet; 4 tabs for 2 days, then 3 tabs for 2 days, 2 tabs for 2 days, then 1 tab for 2 days, then stop  Dispense: 20 tablet; Refill: 0 - DG Chest 2 View; Future - Pulmonary function test; Future  Stop Pulmicort nebs  Start Trelegy Ellipta >>> 1 puff daily in the morning >>>rinse mouth out after use  >>> This inhaler contains 3 medications that help manage her respiratory status, contact our office if you cannot afford this medication or unable to remain on this medication  Start taking your prednisone on 10/14/2019 in the morning  Prednisone  tablet  >>>4 tabs for 2 days, then 3 tabs for 2 days, 2 tabs for 2 days, then 1 tab for 2 days, then stop >>>take with food  >>>take in the morning   Doxycycline >>> 1 100 mg tablet every 12 hours for 7 days >>>take with food  >>>wear sunscreen    2. Pseudomonas aeruginosa infection  We will treat you with antibiotics today.  If your symptoms are not improving please contact our office  3. Tobacco abuse  We recommend that you stop smoking.  >>>You need to set a quit date >>>If you have friends or family who smoke, let them know you are trying to quit and not to smoke around you or in your living environment  Smoking Cessation Resources:  1 800 QUIT NOW  >>> Patient to call this resource and utilize it to help support her quit smoking >>> Keep up your hard work with stopping smoking  You can also contact the [facility] >>>For smoking cessation classes call [phone]  We do not recommend using e-cigarettes as a form of stopping smoking  You can sign up for smoking cessation support texts and information:  >>>https://smokefree.gov/smokefreetxt  We  will also coordinate that in 2 weeks when you have your follow-up appointment with me that you also have a smoking cessation visit with a clinical pharmacist   We recommend today:  Orders Placed This Encounter  Procedures  . DG Chest 2 View    Standing Status:   Future    Number of Occurrences:   1    Standing Expiration Date:   12/12/2020    Order Specific Question:   Reason for Exam (SYMPTOM  OR DIAGNOSIS REQUIRED)    Answer:   cough    Order Specific Question:   Preferred imaging location?    Answer:   Internal    Order Specific Question:   Radiology Contrast Protocol - do NOT remove file path    Answer:   [email or web address]  . Pulmonary function test    Standing Status:   Future    Standing Expiration Date:   10/12/2020    Order Specific Question:   Where should this test be performed?    Answer:   [facility]    Order Specific Question:   Full PFT: includes the following: basic spirometry, spirometry pre & post bronchodilator, diffusion capacity (DLCO), lung volumes    Answer:   Full PFT   Orders Placed This Encounter  Procedures  . DG Chest 2 View  . Pulmonary function test   Meds ordered this encounter  Medications  . doxycycline (VIBRA-TABS) 100 MG tablet    Sig: Take 1 tablet (100 mg total) by mouth 2 (two) times daily.    Dispense:  14 tablet    Refill:  0  . predniSONE (DELTASONE) 10 MG tablet    Sig: 4 tabs for 2 days, then 3 tabs for 2 days, 2 tabs for 2 days, then 1 tab for 2 days, then stop    Dispense:  20 tablet    Refill:  0    Follow Up:    Return in about 2 weeks (around 10/27/2019), or if symptoms worsen or fail to improve, for Follow up with Elisha Headland FNP-C.   Please also schedule clinical pharmacy visit for inhaler teaching with Trelegy Ellipta, also smoking cessation  Please do your part to reduce the spread of COVID-19:      Reduce your risk of any infection  and COVID19 by using the similar  precautions used for avoiding the common cold or flu:  Marland Kitchen Wash your hands often with soap and warm water for at least 20 seconds.  If soap and water are not readily available, use an alcohol-based hand sanitizer with at least 60% alcohol.  . If coughing or sneezing, cover your mouth and nose by coughing or sneezing into the elbow areas of your shirt or coat, into a tissue or into your sleeve (not your hands). Drinda Butts A MASK when in public  . Avoid shaking hands with others and consider head nods or verbal greetings only. . Avoid touching your eyes, nose, or mouth with unwashed hands.  . Avoid close contact with people who are sick. . Avoid places or events with large numbers of people in one location, like concerts or sporting events. . If you have some symptoms but not all symptoms, continue to monitor at home and seek medical attention if your symptoms worsen. . If you are having a medical emergency, call 911.   ADDITIONAL HEALTHCARE OPTIONS FOR PATIENTS  [facility] / e-Visit: [email or web address]         [facility]: [phone]  [facility]: [phone]                   [facility]: [phone]     It is flu season:   >>> Best ways to protect herself from the flu: Receive the yearly flu vaccine, practice good hand hygiene washing with soap and also using hand sanitizer when available, eat a nutritious meals, get adequate rest, hydrate appropriately   Please contact the office if your symptoms worsen or you have concerns that you are not improving.   Thank you for choosing [facility] for your healthcare, and for allowing [place] to partner with you on your healthcare journey. I am thankful to be able to provide care to you today.   Elisha Headland FNP-C    Chronic Obstructive Pulmonary Disease Chronic obstructive pulmonary disease (COPD) is a long-term (chronic) lung problem. When  you have COPD, it is hard for air to get in and out of your lungs. Usually the condition gets worse over time, and your lungs will never return to normal. There are things you can do to keep yourself as healthy as possible.  Your doctor may treat your condition with: ? Medicines. ? Oxygen. ? Lung surgery.  Your doctor may also recommend: ? Rehabilitation. This includes steps to make your body work better. It may involve a team of specialists. ?  Quitting smoking, if you smoke. ? Exercise and changes to your diet. ? Comfort measures (palliative care). Follow these instructions at home: Medicines  Take over-the-counter and prescription medicines only as told by your doctor.  Talk to your doctor before taking any cough or allergy medicines. You may need to avoid medicines that cause your lungs to be dry. Lifestyle  If you smoke, stop. Smoking makes the problem worse. If you need help quitting, ask your doctor.  Avoid being around things that make your breathing worse. This may include smoke, chemicals, and fumes.  Stay active, but remember to rest as well.  Learn and use tips on how to relax.  Make sure you get enough sleep. Most adults need at least 7 hours of sleep every night.  Eat healthy foods. Eat smaller meals more often. Rest before meals. Controlled breathing Learn and use tips on how to control your breathing as told by your doctor. Try:  Breathing in (inhaling) through your nose for 1 second. Then, pucker your lips and breath out (exhale) through your lips for 2 seconds.  Putting one hand on your belly (abdomen). Breathe in slowly through your nose for 1 second. Your hand on your belly should move out. Pucker your lips and breathe out slowly through your lips. Your hand on your belly should move in as you breathe out.  Controlled coughing Learn and use controlled coughing to clear mucus from your lungs. Follow these steps: 1. Lean your head a little forward. 2. Breathe  in deeply. 3. Try to hold your breath for 3 seconds. 4. Keep your mouth slightly open while coughing 2 times. 5. Spit any mucus out into a tissue. 6. Rest and do the steps again 1 or 2 times as needed. General instructions  Make sure you get all the shots (vaccines) that your doctor recommends. Ask your doctor about a flu shot and a pneumonia shot.  Use oxygen therapy and pulmonary rehabilitation if told by your doctor. If you need home oxygen therapy, ask your doctor if you should buy a tool to measure your oxygen level (oximeter).  Make a COPD action plan with your doctor. This helps you to know what to do if you feel worse than usual.  Manage any other conditions you have as told by your doctor.  Avoid going outside when it is very hot, cold, or humid.  Avoid people who have a sickness you can catch (contagious).  Keep all follow-up visits as told by your doctor. This is important. Contact a doctor if:  You cough up more mucus than usual.  There is a change in the color or thickness of the mucus.  It is harder to breathe than usual.  Your breathing is faster than usual.  You have trouble sleeping.  You need to use your medicines more often than usual.  You have trouble doing your normal activities such as getting dressed or walking around the house. Get help right away if:  You have shortness of breath while resting.  You have shortness of breath that stops you from: ? Being able to talk. ? Doing normal activities.  Your chest hurts for longer than 5 minutes.  Your skin color is more blue than usual.  Your pulse oximeter shows that you have low oxygen for longer than 5 minutes.  You have a fever.  You feel too tired to breathe normally. Summary  Chronic obstructive pulmonary disease (COPD) is a long-term lung problem.  The way your lungs  work will never return to normal. Usually the condition gets worse over time. There are things you can do to keep yourself  as healthy as possible.  Take over-the-counter and prescription medicines only as told by your doctor.  If you smoke, stop. Smoking makes the problem worse. This information is not intended to replace advice given to you by your health care provider. Make sure you discuss any questions you have with your health care provider. Document Released: 05/08/2008 Document Revised: 11/02/2017 Document Reviewed: 12/25/2016 Elsevier Patient Education  2020 [employer]   Health Risks of Smoking Smoking cigarettes is very bad for your health. Tobacco smoke has over 200 known poisons in it. It contains the poisonous gases nitrogen oxide and carbon monoxide. There are over 60 chemicals in tobacco smoke that cause cancer. Smoking is difficult to quit because a chemical in tobacco, called nicotine, causes addiction or dependence. When you smoke and inhale, nicotine is absorbed rapidly into the bloodstream through your lungs. Both inhaled and non-inhaled nicotine may be addictive. What are the risks of cigarette smoke? Cigarette smokers have an increased risk of many serious medical problems, including:  Lung cancer.  Lung disease, such as pneumonia, bronchitis, and emphysema.  Chest pain (angina) and heart attack because the heart is not getting enough oxygen.  Heart disease and peripheral blood vessel disease.  High blood pressure (hypertension).  Stroke.  Oral cancer, including cancer of the lip, mouth, or voice box.  Bladder cancer.  Pancreatic cancer.  Cervical cancer.  Pregnancy complications, including premature birth.  Stillbirths and smaller newborn babies, birth defects, and genetic damage to sperm.  Early menopause.  Lower estrogen level for women.  Infertility.  Facial wrinkles.  Blindness.  Increased risk of broken bones (fractures).  Senile dementia.  Stomach ulcers and internal bleeding.  Delayed wound healing and increased risk of complications during surgery.   Even smoking lightly shortens your life expectancy by several years. Because of secondhand smoke exposure, children of smokers have an increased risk of the following:  Sudden infant death syndrome (SIDS).  Respiratory infections.  Lung cancer.  Heart disease.  Ear infections. What are the benefits of quitting? There are many health benefits of quitting smoking. Here are some of them:  Within days of quitting smoking, your risk of having a heart attack decreases, your blood flow improves, and your lung capacity improves. Blood pressure, pulse rate, and breathing patterns start returning to normal soon after quitting.  Within months, your lungs may clear up completely.  Quitting for 10 years reduces your risk of developing lung cancer and heart disease to almost that of a nonsmoker.  People who quit may see an improvement in their overall quality of life. How do I quit smoking?     Smoking is an addiction with both physical and psychological effects, and longtime habits can be hard to change. Your health care provider can recommend:  Programs and community resources, which may include group support, education, or talk therapy.  Prescription medicines to help reduce cravings.  Nicotine replacement products, such as patches, gum, and nasal sprays. Use these products only as directed. Do not replace cigarette smoking with electronic cigarettes, which are commonly called e-cigarettes. The safety of e-cigarettes is not known, and some may contain harmful chemicals.  A combination of two or more of these methods. Where to find more information  American Lung Association: www.lung.org  American Cancer Society: www.cancer.org Summary  Smoking cigarettes is very bad for your health. Cigarette smokers  have an increased risk of many serious medical problems, including several cancers, heart disease, and stroke.  Smoking is an addiction with both physical and psychological effects,  and longtime habits can be hard to change.  By stopping right away, you can greatly reduce the risk of medical problems for you and your family.  To help you quit smoking, your health care provider can recommend programs, community resources, prescription medicines, and nicotine replacement products such as patches, gum, and nasal sprays. This information is not intended to replace advice given to you by your health care provider. Make sure you discuss any questions you have with your health care provider. Document Released: 12/28/2004 Document Revised: 02/21/2018 Document Reviewed: 11/24/2016 Elsevier Patient Education  2020 [employer].

## 2019-10-15 NOTE — Telephone Encounter (Signed)
Printed & placed in folder to be sched.  

## 2019-10-26 NOTE — Progress Notes (Signed)
Virtual Visit via Telephone Note  I connected with Sean Hull on 10/27/19 at  9:00 AM EST by telephone and verified that I am speaking with the correct person using two identifiers.  Location: Patient: Home Provider: [facility] - [address], [place], [place], [place] [address]   I discussed the limitations, risks, security and privacy concerns of performing an evaluation and management service by telephone and the availability of in person appointments. I also discussed with the patient that there may be a patient responsible charge related to this service. The patient expressed understanding and agreed to proceed.  Patient consented to consult via telephone: Yes People present and their role in pt care: Pt     History of Present Illness:  58 year old male current everyday smoker initially referred to our office in March/2020.  He is followed in our office for emphysema and mediastinal lymphadenopathy  PMH: Substance abuse Smoker/ Smoking History: Current everyday smoker. 0.5ppd. 60 pack years.  Maintenance: Trelegy Ellipta  Pt of: Dr. Lamonte Sakai  Chief complaint: 2 weeks follow up   58 year old male current every day smoker completing a 2-week virtual/telephonic visit with our office today.  Patient was last seen in our office 2 weeks ago with acute worsened symptoms of a COPD exacerbation.  Patient was treated with antibiotics and a steroid taper as well as a steroid shot in office.  Patient reports that he is doing significantly better since last office visit.  He is not as audibly winded as he was 2 weeks ago.  Patient has also enjoyed using his Trelegy Ellipta inhaler which she was provided a sample of at last office visit.  He is requesting a prescription of that today as he ran out of the sample yesterday.  He still has aspects of dyspnea with physical exertion.  He plans on taking his budesonide and DuoNeb nebulized medications until he can pick up his Trelegy  Ellipta prescription.  Patient continues to smoke.  See smoking assessment and cessation counseling below.  Patient is also due for flu vaccine as well as Pneumovax 23.  Patient currently scheduled to complete pulmonary function testing on 11/11/2019.  Smoking assessment and cessation counseling  Patient currently smoking: 0.5 -1 ppd  I have advised the patient to quit/stop smoking as soon as possible due to high risk for multiple medical problems.  It will also be very difficult for [place] to manage patient's  respiratory symptoms and status if we continue to expose her lungs to a known irritant.  We do not advise e-cigarettes as a form of stopping smoking.  Patient is willing to quit smoking. Willing to think about it   I have advised the patient that we can assist and have options of nicotine replacement therapy, provided smoking cessation education today, provided smoking cessation counseling, and provided cessation resources.  Patient reports that he is ready to consider and start having a conversation about stopping smoking.  He has not set a quit date.  He admits his anxiety as well as "nerves" are his largest barriers.  He has attempted to use nicotine replacement therapies in the past both patches as well as gum.  He did not feel that these helped.  He is willing to consider Chantix or Wellbutrin.  He has a niece who has had high success with using Wellbutrin.  He has never used Chantix or Wellbutrin before.  Follow-up next office visit office visit for assessment of smoking cessation.  Smoking cessation counseling  advised for: 6 min    Observations/Objective:  02/12/2019 -EBUS, bronchoscopy Specimen 1 of 4, no malignant cells identified Fine-needle aspiration, EBUS 4R node, specimen 2 of 4, no malignant cells identified, and lymphoid tissue present Fine-needle aspiration endoscopic specimen EBUS 7 node specimen 3 of 4, no malignant cells identified, and lymphoid tissue  present Bronchial lavage specimen right lower lobe specimen 4 of 4, no malignant cells identified BAL No fungus isolated after 21 days BAL-Pseudomonas Aerignosa, pansensitive AFB-negative  07/08/2019-CT chest without contrast-resolution of previously demonstrated bilateral pneumonia, persistent but stable 9 mm pulmonary nodule right lung base, a 6 to 85-monthfollow-up CT is recommended to confirm stability, severe emphysematous changes  Assessment and Plan:  [place] Plan: Continue Trelegy Ellipta Emphasized need for the patient to stop smoking We'll schedule 1 to 2-week clinical pharmacy team appointment to review smoking cessation as well as inhaler teaching if it is able to be completed in office 6-week follow-up in office Referral to lung cancer screening program PFT scheduled for 11/11/2019  Abnormal findings on diagnostic imaging of lung Plan: We'll repeat CT in April/2021 We'll refer to lung cancer screening program to see if patient's CT in April/2021 could be a lung cancer screening CT  Pseudomonas aeruginosa infection Plan: We'll continue to monitor this clinically  Tobacco abuse Has attempted to use nicotine replacement therapies in the past did not find them helpful Interested in Chantix or Wellbutrin Open to speaking with clinical pharmacy team  Plan: We'll schedule 1 to 2-week appointment with clinical pharmacy team for smoking cessation Referral to lung cancer screening program to see if April/2021 CT for lung nodule follow-up could be considered for lung cancer screening CT  Medication management Tolerating Trelegy Ellipta well  Plan: We'll send prescription for Trelegy Ellipta  Mediastinal lymphadenopathy Plan: We'll continue to monitor Emphasized need for the patient to stop smoking   Healthcare maintenance Plan:  Flu vaccine needed  Pneumovax23 needed    Follow Up Instructions:  Return in about 6 weeks (around 12/08/2019),  or if symptoms worsen or fail to improve, for Follow up with BWyn QuakerFNP-C.   I discussed the assessment and treatment plan with the patient. The patient was provided an opportunity to ask questions and all were answered. The patient agreed with the plan and demonstrated an understanding of the instructions.   The patient was advised to call back or seek an in-person evaluation if the symptoms worsen or if the condition fails to improve as anticipated.  I provided 25 minutes of non-face-to-face time during this encounter.   BLauraine Rinne NP

## 2019-10-27 ENCOUNTER — Encounter: Payer: Self-pay | Admitting: Pulmonary Disease

## 2019-10-27 ENCOUNTER — Ambulatory Visit (INDEPENDENT_AMBULATORY_CARE_PROVIDER_SITE_OTHER): Payer: BC Managed Care – PPO | Admitting: Pulmonary Disease

## 2019-10-27 DIAGNOSIS — J432 Centrilobular emphysema: Secondary | ICD-10-CM

## 2019-10-27 DIAGNOSIS — A498 Other bacterial infections of unspecified site: Secondary | ICD-10-CM

## 2019-10-27 DIAGNOSIS — R590 Localized enlarged lymph nodes: Secondary | ICD-10-CM

## 2019-10-27 DIAGNOSIS — Z0000 Encounter for general adult medical examination without abnormal findings: Secondary | ICD-10-CM | POA: Insufficient documentation

## 2019-10-27 DIAGNOSIS — R918 Other nonspecific abnormal finding of lung field: Secondary | ICD-10-CM

## 2019-10-27 DIAGNOSIS — Z79899 Other long term (current) drug therapy: Secondary | ICD-10-CM | POA: Diagnosis not present

## 2019-10-27 DIAGNOSIS — Z720 Tobacco use: Secondary | ICD-10-CM

## 2019-10-27 DIAGNOSIS — F17210 Nicotine dependence, cigarettes, uncomplicated: Secondary | ICD-10-CM

## 2019-10-27 MED ORDER — TRELEGY ELLIPTA 100-62.5-25 MCG/INH IN AEPB: 1.0000 | INHALATION_SPRAY | Freq: Every day | RESPIRATORY_TRACT | 3 refills | Status: DC

## 2019-10-27 NOTE — Patient Instructions (Addendum)
You were seen today by Coral Ceo, NP  for:   1. Centrilobular emphysema (HCC)  Trelegy Ellipta  >>> 1 puff daily in the morning >>>rinse mouth out after use  >>> This inhaler contains 3 medications that help manage her respiratory status, contact our office if you cannot afford this medication or unable to remain on this medication  Only use your albuterol as a rescue medication to be used if you can't catch your breath by resting or doing a relaxed purse lip breathing pattern.   - The less you use it, the better it will work when you need it. - Ok to use up to 2 puffs  every 4 hours if you must but call for immediate appointment if use goes up over your usual need - Don't leave home without it !!  (think of it like the spare tire for your car)   Note your daily symptoms > remember "red flags" for COPD:   >>>Increase in cough >>>increase in sputum production >>>increase in shortness of breath or activity  intolerance.   If you notice these symptoms, please call the office to be seen.    2. Tobacco abuse  We recommend that you stop smoking.  >>>You need to set a quit date >>>If you have friends or family who smoke, let them know you are trying to quit and not to smoke around you or in your living environment  Smoking Cessation Resources:  1 800 QUIT NOW  >>> Patient to call this resource and utilize it to help support her quit smoking >>> Keep up your hard work with stopping smoking  You can also contact the [facility] >>>For smoking cessation classes call [phone]  We do not recommend using e-cigarettes as a form of stopping smoking  You can sign up for smoking cessation support texts and information:  >>>https://smokefree.gov/smokefreetxt  We will refer you today to our lung cancer screening program >>>This is based off of your 60 pack-year smoking history >>> This is a recommendation from the [place] preventative services task force (USPSTF) >>>The  USPSTF recommends annual screening for lung cancer with low-dose computed tomography (LDCT) in adults aged 49 to 80 years who have a 30 pack-year smoking history and currently smoke or have quit within the past 15 years. Screening should be discontinued once a person has not smoked for 15 years or develops a health problem that substantially limits life expectancy or the ability or willingness to have curative lung surgery.   Our office will call you and set up an appointment with Kandice Robinsons (Nurse Practitioner) who leads this program.  This appointment takes place in our office.  After completing this meeting with Kandice Robinsons NP you will get a low-dose CT as the screening >>>We will call you with those results  Please present to our office in 1-2 weeks for an appointment with the clinical pharmacy team for:  . Smoking cessation . Medication reconciliation  . Inhaler teaching    Needs Pneumovax23 at next office visit  Needs flu vaccine at next office visit   3. Pseudomonas aeruginosa infection  Contact our office if you have any worsened fevers, or discolored mucous   4. Medication management  Script sent today for Trelegy Ellipta   Please present to our office in 1-2 weeks for an appointment with the clinical pharmacy team for:  . Smoking cessation . Medication reconciliation  . Inhaler teaching   5. Abnormal findings on diagnostic imaging of lung  Repeat CT in April 2021  Referral to lung cancer screening program    Follow Up:    Return in about 6 weeks (around 12/08/2019), or if symptoms worsen or fail to improve, for Follow up with Elisha HeadlandBrian Mack FNP-C.   Please do your part to reduce the spread of COVID-19:      Reduce your risk of any infection  and COVID19 by using the similar precautions used for avoiding the common cold or flu:  Marland Kitchen. Wash your hands often with soap and warm water for at least 20 seconds.  If soap and water are not readily available, use an  alcohol-based hand sanitizer with at least 60% alcohol.  . If coughing or sneezing, cover your mouth and nose by coughing or sneezing into the elbow areas of your shirt or coat, into a tissue or into your sleeve (not your hands). Drinda Butts. WEAR A MASK when in public  . Avoid shaking hands with others and consider head nods or verbal greetings only. . Avoid touching your eyes, nose, or mouth with unwashed hands.  . Avoid close contact with people who are sick. . Avoid places or events with large numbers of people in one location, like concerts or sporting events. . If you have some symptoms but not all symptoms, continue to monitor at home and seek medical attention if your symptoms worsen. . If you are having a medical emergency, call 911.   ADDITIONAL HEALTHCARE OPTIONS FOR PATIENTS  [facility] / e-Visit: [email or web address]         [facility]: [phone]  [facility]: [phone]                   [facility]: [phone]     It is flu season:   >>> Best ways to protect herself from the flu: Receive the yearly flu vaccine, practice good hand hygiene washing with soap and also using hand sanitizer when available, eat a nutritious meals, get adequate rest, hydrate appropriately   Please contact the office if your symptoms worsen or you have concerns that you are not improving.   Thank you for choosing [facility] for your healthcare, and for allowing us to partner with you on your healthcare journey. I am thankful to be able to provide care to you today.   Elisha HeadlandBrian Mack FNP-C    Health Risks of Smoking Smoking cigarettes is very bad for your health. Tobacco smoke has over 200 known poisons in it. It contains the poisonous gases nitrogen oxide and carbon monoxide. There are over 60 chemicals in tobacco smoke that cause cancer. Smoking is difficult to quit because a chemical in  tobacco, called nicotine, causes addiction or dependence. When you smoke and inhale, nicotine is absorbed rapidly into the bloodstream through your lungs. Both inhaled and non-inhaled nicotine may be addictive. What are the risks of cigarette smoke? Cigarette smokers have an increased risk of many serious medical problems, including:  Lung cancer.  Lung disease, such as pneumonia, bronchitis, and emphysema.  Chest pain (angina) and heart attack because the heart is not getting enough oxygen.  Heart disease and peripheral blood vessel disease.  High blood pressure (hypertension).  Stroke.  Oral cancer, including cancer of the lip, mouth, or voice box.  Bladder cancer.  Pancreatic cancer.  Cervical cancer.  Pregnancy complications, including premature birth.  Stillbirths and smaller newborn babies, birth defects, and genetic damage to sperm.  Early menopause.  Lower estrogen  level for women.  Infertility.  Facial wrinkles.  Blindness.  Increased risk of broken bones (fractures).  Senile dementia.  Stomach ulcers and internal bleeding.  Delayed wound healing and increased risk of complications during surgery.  Even smoking lightly shortens your life expectancy by several years. Because of secondhand smoke exposure, children of smokers have an increased risk of the following:  Sudden infant death syndrome (SIDS).  Respiratory infections.  Lung cancer.  Heart disease.  Ear infections. What are the benefits of quitting? There are many health benefits of quitting smoking. Here are some of them:  Within days of quitting smoking, your risk of having a heart attack decreases, your blood flow improves, and your lung capacity improves. Blood pressure, pulse rate, and breathing patterns start returning to normal soon after quitting.  Within months, your lungs may clear up completely.  Quitting for 10 years reduces your risk of developing lung cancer and heart  disease to almost that of a nonsmoker.  People who quit may see an improvement in their overall quality of life. How do I quit smoking?     Smoking is an addiction with both physical and psychological effects, and longtime habits can be hard to change. Your health care provider can recommend:  Programs and community resources, which may include group support, education, or talk therapy.  Prescription medicines to help reduce cravings.  Nicotine replacement products, such as patches, gum, and nasal sprays. Use these products only as directed. Do not replace cigarette smoking with electronic cigarettes, which are commonly called e-cigarettes. The safety of e-cigarettes is not known, and some may contain harmful chemicals.  A combination of two or more of these methods. Where to find more information  American Lung Association: www.lung.org  American Cancer Society: www.cancer.org Summary  Smoking cigarettes is very bad for your health. Cigarette smokers have an increased risk of many serious medical problems, including several cancers, heart disease, and stroke.  Smoking is an addiction with both physical and psychological effects, and longtime habits can be hard to change.  By stopping right away, you can greatly reduce the risk of medical problems for you and your family.  To help you quit smoking, your health care provider can recommend programs, community resources, prescription medicines, and nicotine replacement products such as patches, gum, and nasal sprays. This information is not intended to replace advice given to you by your health care provider. Make sure you discuss any questions you have with your health care provider. Document Released: 12/28/2004 Document Revised: 02/21/2018 Document Reviewed: 11/24/2016 Elsevier Patient Education  2020 Elsevier Inc.    Chronic Obstructive Pulmonary Disease Chronic obstructive pulmonary disease (COPD) is a long-term (chronic) lung  problem. When you have COPD, it is hard for air to get in and out of your lungs. Usually the condition gets worse over time, and your lungs will never return to normal. There are things you can do to keep yourself as healthy as possible.  Your doctor may treat your condition with: ? Medicines. ? Oxygen. ? Lung surgery.  Your doctor may also recommend: ? Rehabilitation. This includes steps to make your body work better. It may involve a team of specialists. ? Quitting smoking, if you smoke. ? Exercise and changes to your diet. ? Comfort measures (palliative care). Follow these instructions at home: Medicines  Take over-the-counter and prescription medicines only as told by your doctor.  Talk to your doctor before taking any cough or allergy medicines. You may need to avoid medicines  that cause your lungs to be dry. Lifestyle  If you smoke, stop. Smoking makes the problem worse. If you need help quitting, ask your doctor.  Avoid being around things that make your breathing worse. This may include smoke, chemicals, and fumes.  Stay active, but remember to rest as well.  Learn and use tips on how to relax.  Make sure you get enough sleep. Most adults need at least 7 hours of sleep every night.  Eat healthy foods. Eat smaller meals more often. Rest before meals. Controlled breathing Learn and use tips on how to control your breathing as told by your doctor. Try:  Breathing in (inhaling) through your nose for 1 second. Then, pucker your lips and breath out (exhale) through your lips for 2 seconds.  Putting one hand on your belly (abdomen). Breathe in slowly through your nose for 1 second. Your hand on your belly should move out. Pucker your lips and breathe out slowly through your lips. Your hand on your belly should move in as you breathe out.  Controlled coughing Learn and use controlled coughing to clear mucus from your lungs. Follow these steps: 1. Lean your head a little  forward. 2. Breathe in deeply. 3. Try to hold your breath for 3 seconds. 4. Keep your mouth slightly open while coughing 2 times. 5. Spit any mucus out into a tissue. 6. Rest and do the steps again 1 or 2 times as needed. General instructions  Make sure you get all the shots (vaccines) that your doctor recommends. Ask your doctor about a flu shot and a pneumonia shot.  Use oxygen therapy and pulmonary rehabilitation if told by your doctor. If you need home oxygen therapy, ask your doctor if you should buy a tool to measure your oxygen level (oximeter).  Make a COPD action plan with your doctor. This helps you to know what to do if you feel worse than usual.  Manage any other conditions you have as told by your doctor.  Avoid going outside when it is very hot, cold, or humid.  Avoid people who have a sickness you can catch (contagious).  Keep all follow-up visits as told by your doctor. This is important. Contact a doctor if:  You cough up more mucus than usual.  There is a change in the color or thickness of the mucus.  It is harder to breathe than usual.  Your breathing is faster than usual.  You have trouble sleeping.  You need to use your medicines more often than usual.  You have trouble doing your normal activities such as getting dressed or walking around the house. Get help right away if:  You have shortness of breath while resting.  You have shortness of breath that stops you from: ? Being able to talk. ? Doing normal activities.  Your chest hurts for longer than 5 minutes.  Your skin color is more blue than usual.  Your pulse oximeter shows that you have low oxygen for longer than 5 minutes.  You have a fever.  You feel too tired to breathe normally. Summary  Chronic obstructive pulmonary disease (COPD) is a long-term lung problem.  The way your lungs work will never return to normal. Usually the condition gets worse over time. There are things you  can do to keep yourself as healthy as possible.  Take over-the-counter and prescription medicines only as told by your doctor.  If you smoke, stop. Smoking makes the problem worse. This information is not  intended to replace advice given to you by your health care provider. Make sure you discuss any questions you have with your health care provider. Document Released: 05/08/2008 Document Revised: 11/02/2017 Document Reviewed: 12/25/2016 Elsevier Patient Education  2020 [employer].

## 2019-10-27 NOTE — Assessment & Plan Note (Signed)
Tolerating Trelegy Ellipta well  Plan: We'll send prescription for Trelegy Ellipta

## 2019-10-27 NOTE — Assessment & Plan Note (Signed)
Plan:  Flu vaccine needed  Pneumovax23 needed

## 2019-10-27 NOTE — Assessment & Plan Note (Signed)
Has attempted to use nicotine replacement therapies in the past did not find them helpful Interested in Chantix or Wellbutrin Open to speaking with clinical pharmacy team  Plan: We'll schedule 1 to 2-week appointment with clinical pharmacy team for smoking cessation Referral to lung cancer screening program to see if April/2021 CT for lung nodule follow-up could be considered for lung cancer screening CT

## 2019-10-27 NOTE — Addendum Note (Signed)
Addended by: Valerie Salts on: 10/27/2019 09:03 AM   Modules accepted: Orders

## 2019-10-27 NOTE — Assessment & Plan Note (Signed)
Plan: We'll continue to monitor Emphasized need for the patient to stop smoking

## 2019-10-27 NOTE — Assessment & Plan Note (Addendum)
Plan: Continue Trelegy Ellipta Emphasized need for the patient to stop smoking We'll schedule 1 to 2-week clinical pharmacy team appointment to review smoking cessation as well as inhaler teaching if it is able to be completed in office 6-week follow-up in office Referral to lung cancer screening program PFT scheduled for 11/11/2019

## 2019-10-27 NOTE — Assessment & Plan Note (Signed)
Plan: We'll continue to monitor this clinically

## 2019-10-27 NOTE — Assessment & Plan Note (Signed)
Plan: We'll repeat CT in April/2021 We'll refer to lung cancer screening program to see if patient's CT in April/2021 could be a lung cancer screening CT

## 2019-11-06 NOTE — Progress Notes (Signed)
Subjective Patient presents to [facility] and seen by the pharmacist for smoking cessation counseling. PMH is significant for mediastinal lymphadenopathy and centrilobulbar emphysema.  Patient was referred and last seen by  Wyn Quaker, NP, on 10/27/2019.  Per last appt with Wyn Quaker patient reports he is ready to consider and start having a conversation about stopping smoking.  He has not set a quit date.  He admits his anxiety as well as "nerves" are his largest barriers.  He has attempted to use nicotine replacement therapies in the past both patches as well as gum.  He did not feel that these helped.  He is willing to consider Chantix or Wellbutrin.  He has a niece who has had high success with using Wellbutrin.  He has never used Chantix or Wellbutrin before.  He is currently using Trelegy Ellipta for COPD management.  He also has Proair and nebulized budesonide and Duoneb to use as needed.  Pt presents today for initial pharmacy appt. Patient states he feels comfortable with using Trelegy Ellipta inhaler, however, mentions it costs $100 per month. When discussing smoking cessation, he states he came the closest to quitting was when he used the lozenge. He would use 10-15 lozenges per day. He states that the patch did not work because he still had the urge to smoke. He states he has smoked up to 2 packs per day, but has recently has decreased use to 1 pack per day (around Feb 2020).  Social History   Tobacco Use  Smoking Status Current Every Day Smoker  . Packs/day: 1.50  . Years: 40.00  . Pack years: 60.00  Smokeless Tobacco Never Used  Tobacco Comment   07/10/19 cigs 1/2 PPD     Tobacco Use History  Age when started using tobacco on a daily basis: 16  Type: cigarettes.  Number of cigarettes per day 20 cigarettes/daily, brand Marlboro Lights  Smokes first cigarette 30 minutes after waking.  Does wake at night to smoke  Triggers include stress (crowds, people looking  over his shoulder (e.g., at work (runs machinery), "something not going his way"), parents smoke  Quit Attempt History   Most recent quit attempt within past 5 years  Longest time ever been tobacco free:3 months   Methods tried in the past include Nicotine Lozenge.   Rates IMPORTANCE of quitting tobacco on 1-10 scale of 10  Rates READINESS of quitting tobacco on 1-10 scale of 10  Rates CONFIDENCE of quitting tobacco on 1-10 scale of 8  Motivators to quitting include health; barriers include nerves, friends smoking    There is no immunization history on file for this patient.   Assessment and Plan  1) Smoking Cessation (Quit Date: 11/17/2019) -Initiated nicotine replacement tx with bupropion, nicotine patch, and nicotine lozenge considering pt's extensive smoking history. Patient counseled on purpose, proper use, and potential adverse effects. Frederik Schmidt, certified pharmacy technician, ran test claims for smoking cessation agents and determined that nicotine patch- zero copay (Dr. Eden Emms brand), nicotine lozenge- zero copay ([employer] brand), and bupropion 150mg  XL- $10.00 copay (must use mail order after 3 fills). Patient finds these prices affordable.   For future reference, if pt is not successful with bupropion then chantix starter and maintenance dose has a $0 copay through his insurance.  Bupropion -Initiated bupropion 150 mg daily. Plan for patient to increase to 300 mg daily after 3 days. Patient with no PMH of seizures. Patient counseled on purpose, proper use, and potential adverse effects, including  insomnia, and potential change in mood.   Nicotine Patch Patient counseled on purpose, proper use, and potential adverse effects, including mild itching or redness at the point of application, headache, trouble sleeping, and/or vivid dreams    Patch Schedule for >10 cigarettes daily -Weeks 1-6: one 21 mg patch daily  Nicotine Lozenge Patient counseled on purpose, proper  use, and potential adverse effects including nausea, hiccups, cough, and heartburn.  Instructed patient to use 4 since he smokes 30 minutes after waking up.  Lozenge dosing schedule Weeks 1 to 6: 1 lozenge every 1 to 2 hours (maximum: 5 lozenges every 6 hours; 20 lozenges/day); to increase chances of quitting, use at least 9 lozenges/day during the first 6 weeks   Weeks 7 to 9: 1 lozenge every 2 to 4 hours (maximum: 5 lozenges every 6 hours; 20 lozenges/day)   Weeks 10 to 12: 1 lozenge every 4 to 8 hours (maximum: 5 lozenges every 6 hours; 20 lozenges/day)  -Provided information on 1 800-QUIT NOW support program. Patient appreciates information, however, states he would prefer to have pharmacy team follow up with him rather than calling quitline. Will follow up with patient in 2 weeks (11/17/2019)  2) Inhaler Education He is currently using Trelegy Ellipta. Inspiratory flow measured using the In-check DIAL G16 and was in range for use of Ellipta device. Patient scored 75 which is in the 30-90 range for Ellipta Device.  Patient was counseled on the purpose, proper use, and adverse effects of Trelegy Ellipta inhaler.  Instructed patient to rinse mouth with water after using in order to prevent fungal infection.  Patient verbalized understanding.  Reviewed appropriate use of maintenance vs rescue inhalers.  Stressed importance of using maintenance inhaler daily and rescue inhaler only as needed.  Patient verbalized understanding.  Demonstrated proper inhaler technique using Ellipta demo inhaler.  Patient able to demonstrate proper inhaler technique using teach back method. Applied for copay card for Trelegy Ellipta inhaler at appt. Informed pt that this card will be sent to his email and to provide information to pharmacy when he refills his Trelegy inhaler next. Pt verbalized understanding.  3) Medication Review A drug regimen assessment was performed, including review of allergies, interactions,  disease-state management, dosing and immunization history. Medications were reviewed with the patient, including name, instructions, indication, goals of therapy, potential side effects, importance of adherence, and safe use. -Patient has completed abx and steroid therapy, therefore, medications were taken off his med list.   4) Immunizations Patient is a candidate for influenzae and pneumonia vaccine, therefore, flu and pneumovax23 were administered today (11/10/2019).   Thank you for involving pharmacy to assist in providing Mr. Hensarling's care.   Zachery Conch, PharmD PGY2 Ambulatory Care Pharmacy Resident

## 2019-11-10 ENCOUNTER — Ambulatory Visit (INDEPENDENT_AMBULATORY_CARE_PROVIDER_SITE_OTHER): Payer: BC Managed Care – PPO | Admitting: Pharmacist

## 2019-11-10 ENCOUNTER — Other Ambulatory Visit: Payer: Self-pay

## 2019-11-10 DIAGNOSIS — Z0000 Encounter for general adult medical examination without abnormal findings: Secondary | ICD-10-CM

## 2019-11-10 DIAGNOSIS — Z720 Tobacco use: Secondary | ICD-10-CM

## 2019-11-10 DIAGNOSIS — J449 Chronic obstructive pulmonary disease, unspecified: Secondary | ICD-10-CM

## 2019-11-10 DIAGNOSIS — Z23 Encounter for immunization: Secondary | ICD-10-CM | POA: Diagnosis not present

## 2019-11-10 DIAGNOSIS — F17210 Nicotine dependence, cigarettes, uncomplicated: Secondary | ICD-10-CM | POA: Diagnosis not present

## 2019-11-10 MED ORDER — NICOTINE POLACRILEX 4 MG MT LOZG: 4.0000 mg | LOZENGE | OROMUCOSAL | 30 refills | Status: DC | PRN

## 2019-11-10 MED ORDER — NICOTINE 21 MG/24HR TD PT24: 21.0000 mg | MEDICATED_PATCH | Freq: Every day | TRANSDERMAL | 11 refills | Status: DC

## 2019-11-10 MED ORDER — BUPROPION HCL ER (XL) 150 MG PO TB24: 150.0000 mg | ORAL_TABLET | Freq: Every day | ORAL | 11 refills | Status: DC

## 2019-11-10 NOTE — Patient Instructions (Signed)
It was a pleasure seeing you in clinic today [name]!  Today the plan is... 1. Go to pharmacy to activate Trelegy Ellipta coupon (sent to your email).  2. Continue to use Trelegy Ellipta one puff per day  3. Start using bupropion 1-2 weeks before quit date (planned for 11/17/2019 - will re-assess readiness at that time) Bupropion -Initiated bupropion 150 mg one tablet daily then AFTER 3 days increase to 300 mg daily.   Nicotine Patch Start using one 21 mg patch daily (will use for 6 weeks)   Nicotine Lozenge Start using nicotine lozenge 4 mg. Try to use at least 9 lozenges per day for first 6 weeks.  Lozenge dosing schedule Weeks 1 to 6: 1 lozenge every 1 to 2 hours (maximum: 5 lozenges every 6 hours; 20 lozenges/day); to increase chances of quitting, use at least 9 lozenges/day during the first 6 weeks   Weeks 7 to 9: 1 lozenge every 2 to 4 hours (maximum: 5 lozenges every 6 hours; 20 lozenges/day)   Weeks 10 to 12: 1 lozenge every 4 to 8 hours (maximum: 5 lozenges every 6 hours; 20 lozenges/day)   Please call the PharmD clinic at [phone] if you have any questions that you would like to speak with a pharmacist about Sean Hull, Museum/gallery conservator).

## 2019-11-17 ENCOUNTER — Telehealth: Payer: Self-pay | Admitting: Pharmacist

## 2019-11-17 NOTE — Telephone Encounter (Signed)
Called pt on 11/17/2019 at 3:36 PM. Unable to leave HIPAA-compliant VM with instructions to call [place] clinic back considering VM was not set up yet.  Plan to follow up with patient on 11/21/2019 regarding smoking cessation  Drexel Iha, PharmD PGY2 Ambulatory Care Pharmacy Resident

## 2019-11-21 NOTE — Telephone Encounter (Signed)
Called patient on 11/21/2019 at 10:55 AM. Unable to leave HIPAA-compliant VM with instructions to call [facility] back considering VM box is not set up.  Plan to discuss smoking cessation (failed attempt #2)  Drexel Iha, PharmD PGY2 Ambulatory Care Pharmacy Resident

## 2019-12-01 ENCOUNTER — Telehealth: Payer: Self-pay | Admitting: Pharmacist

## 2019-12-01 NOTE — Telephone Encounter (Signed)
Called patient on 12/01/2019 at 2:15 PM. Unable to leave HIPAA-compliant VM with instructions to call [facility] back considering VM box is not set up.  Plan to discuss smoking cessation (failed attempt #3)  Drexel Iha, PharmD PGY2 Ambulatory Care Pharmacy Resident

## 2019-12-05 ENCOUNTER — Telehealth: Payer: Self-pay | Admitting: Pharmacist

## 2019-12-05 NOTE — Telephone Encounter (Signed)
Called patient on 12/05/2019 at 2:31 PM.  Patient has decreased from smoking 20 cigarettes per day to 3-4 per day -- congratulated patient for his success!! Patient smokes 3-4 cigarettes at night after work. He smokes in his car. He is tolerating bupropion, nicotine patch, and nicotine lozenge. He is currently using 3-4 nicotine lozenges per day - mainly when he is at work. Discussed with patient he can increase nicotine lozenge use if interested (package insert recommends 9 lozenges per day for first 6 weeks).  Age when started using tobacco on a daily basis: 16 Preferred cigarette brand: Marlboro Lights Tobacco use: 3-4 per day Triggers: stress (crowds, people looking over his shoulder (e.g., at work (runs machinery), "something not going his way"), parents smoke  Current smoking cessation agents: bupropion 300 mg daily, nicotine patch 21 mg daily (started week of 11/10/2019), nicotine lozenge 4 mg Prior smoking cessation agents: nicotine lozenge Non-pharmacologic methods: mints  Motivation to quit: health Goal: "smoke less than what he is smoking now"  Plan: smoke 1/2 cigarette at a time rather than 1 cigarette at a time. Continue bupropion, nicotine patch, and nicotine lozenge  Follow up: 12/19/2019 -Assess at follow up if he is ready to decrease to nicotine 14 mg daily patch

## 2019-12-07 NOTE — Progress Notes (Signed)
_0  ID: [name], male    DOB: [birth year], 59 y.o.   MRN: [id number]  Chief Complaint  Patient presents with  . Follow-up    6 wk f/u. States his breathing has gotten worse. Increased SOB with exertion. Has a productive cough with "cream" color phlegm. CAT score= 24     Referring provider: No ref. provider found  HPI:  59 year old male current everyday smoker initially referred to our office in March/2020.  He is followed in our office for emphysema and mediastinal lymphadenopathy  PMH: Substance abuse - crack cocaine Smoker/ Smoking History: Current everyday smoker. 0.5ppd. 60 pack years.  Maintenance: Trelegy Ellipta  Pt of: Dr. Lamonte Sakai  12/08/2019  - Visit   59 year old current everyday smoker followed in our office for emphysema as well as mediastinal lymphadenopathy.  Patient has planned repeat CT in February/2021.  Patient continues to work with the clinical pharmacy team to stop smoking.  He has completed follow-up with them has been started on Wellbutrin, 21 mg nicotine patch, 4 mg nicotine lozenge.  Overall he has been able to decrease his smoking down to 3 to 8 cigarettes a day.  Patient still having aspects of shortness of breath.  He does feel benefit from using his Trelegy Ellipta.  Patient does have a cough with congestion with white thick mucus.  Patient feels that this is his baseline.  Patient has received his flu vaccine as well as the pneumonia vaccine.  Patient still needs pulmonary function test.  Questionaires / Pulmonary Flowsheets:   MMRC: mMRC Dyspnea Scale mMRC Score  12/08/2019 3  10/13/2019 3    Tests:   02/12/2019 -EBUS, bronchoscopy Specimen 1 of 4, no malignant cells identified Fine-needle aspiration, EBUS 4R node, specimen 2 of 4, no malignant cells identified, and lymphoid tissue present Fine-needle aspiration endoscopic specimen EBUS 7 node specimen 3 of 4, no malignant cells identified, and lymphoid tissue present Bronchial lavage  specimen right lower lobe specimen 4 of 4, no malignant cells identified BAL No fungus isolated after 21 days BAL-Pseudomonas Aerignosa, pansensitive AFB-negative  07/08/2019-CT chest without contrast-resolution of previously demonstrated bilateral pneumonia, persistent but stable 9 mm pulmonary nodule right lung base, a 6 to 21-monthfollow-up CT is recommended to confirm stability, severe emphysematous changes  FENO:  No results found for: NITRICOXIDE  PFT: No flowsheet data found.  WALK:  SIX MIN WALK 12/08/2019  Supplimental Oxygen during Test? (L/min) No  Tech Comments: Patient was able to walk 2 laps at a steady pace. Stated he felt fine during the first lap but towards the end of the 2nd lap, he had some chest tightness. Denied any chest or leg pain. No O2 was needed during or after walk.    Imaging: No results found.  Lab Results:  CBC    Component Value Date/Time   WBC 7.9 02/10/2019 1138   RBC 4.36 02/10/2019 1138   HGB 13.8 02/10/2019 1138   HCT 44.1 02/10/2019 1138   PLT 343 02/10/2019 1138   MCV 101.1 (H) 02/10/2019 1138   MCH 31.7 02/10/2019 1138   MCHC 31.3 02/10/2019 1138   RDW 13.4 02/10/2019 1138    BMET    Component Value Date/Time   NA 139 02/10/2019 1138   K 4.7 02/10/2019 1138   CL 105 02/10/2019 1138   CO2 24 02/10/2019 1138   GLUCOSE 109 (H) 02/10/2019 1138   BUN 19 02/10/2019 1138   CREATININE 0.97 02/10/2019 1138   CALCIUM 9.1 02/10/2019 1138  GFRNONAA >60 02/10/2019 1138   GFRAA >60 02/10/2019 1138    BNP No results found for: BNP  ProBNP No results found for: PROBNP  Specialty Problems      Pulmonary Problems   Centrilobular emphysema (HCC)      No Known Allergies  Immunization History  Administered Date(s) Administered  . Influenza,inj,Quad PF,6+ Mos 11/10/2019  . Pneumococcal Polysaccharide-23 11/10/2019    Past Medical History:  Diagnosis Date  . Acute respiratory failure with hypoxia ([place])   . Arthritis     hands  . CAP (community acquired pneumonia)   . COPD (chronic obstructive pulmonary disease) ([place])   . Dyspnea    with exertion  . Pneumonia   . Substance use disorder   . Tobacco use     Tobacco History: Social History   Tobacco Use  Smoking Status Current Every Day Smoker  . Packs/day: 1.50  . Years: 40.00  . Pack years: 60.00  Smokeless Tobacco Never Used  Tobacco Comment   3-8 cigs a day    Ready to quit: Yes Counseling given: Yes Comment: 3-8 cigs a day    Smoking assessment and cessation counseling  Patient currently smoking: 3-8 cigarettes  I have advised the patient to quit/stop smoking as soon as possible due to high risk for multiple medical problems.  It will also be very difficult for [place] to manage patient's  respiratory symptoms and status if we continue to expose her lungs to a known irritant.  We do not advise e-cigarettes as a form of stopping smoking.  Patient is or is not willing to quit smoking.  Using wellbutrin, 21 mg patch, 31m lozenge (8-10 a day) Struggles mostly afterwork and evening.   I have advised the patient that we can assist and have options of nicotine replacement therapy, provided smoking cessation education today, provided smoking cessation counseling, and provided cessation resources.  Follow-up next office visit office visit for assessment of smoking cessation.  Smoking cessation counseling advised for: 5 min    Outpatient Encounter Medications as of 12/08/2019  Medication Sig  . budesonide (PULMICORT) 0.5 MG/2ML nebulizer solution Take 0.5 mg by nebulization 2 (two) times daily.   .Marland KitchenbuPROPion (WELLBUTRIN XL) 150 MG 24 hr tablet Take 1 tablet (150 mg total) by mouth daily. (Use one tablet per day for 3 days then increase to 2 tablets daily)  . Fluticasone-Umeclidin-Vilant (TRELEGY ELLIPTA) 100-62.5-25 MCG/INH AEPB Inhale 1 puff into the lungs daily.  .Marland Kitchenibuprofen (ADVIL,MOTRIN) 400 MG tablet Take 400 mg by mouth 2 (two) times daily as  needed.  .Marland Kitchenipratropium-albuterol (DUONEB) 0.5-2.5 (3) MG/3ML SOLN Take 3 mLs by nebulization every 6 (six) hours as needed (shortness of breath or wheezing).   . nicotine (NICODERM CQ - DOSED IN MG/24 HOURS) 21 mg/24hr patch Place 1 patch (21 mg total) onto the skin daily.  . nicotine polacrilex (NICOTINE MINI) 4 MG lozenge Take 1 lozenge (4 mg total) by mouth as needed for smoking cessation.  .Marland KitchenPROAIR HFA 108 (90 Base) MCG/ACT inhaler Inhale 1-2 puffs into the lungs every 6 (six) hours as needed for wheezing or shortness of breath.    No facility-administered encounter medications on file as of 12/08/2019.    Review of Systems  Review of Systems  Constitutional: Positive for fatigue. Negative for activity change, chills, fever and unexpected weight change.  HENT: Negative for postnasal drip, rhinorrhea, sinus pressure, sinus pain and sore throat.   Eyes: Negative.   Respiratory: Positive for cough and  shortness of breath. Negative for wheezing.   Cardiovascular: Negative for chest pain, palpitations and leg swelling.  Gastrointestinal: Negative for constipation, diarrhea, nausea and vomiting.  Endocrine: Negative.   Genitourinary: Negative.   Musculoskeletal: Negative.   Skin: Negative.   Neurological: Negative for dizziness and headaches.  Psychiatric/Behavioral: Negative.  Negative for dysphoric mood. The patient is not nervous/anxious.   All other systems reviewed and are negative.    Physical Exam  BP 138/88 (BP Location: Left Arm, Patient Position: Sitting, Cuff Size: Normal)   Pulse 84   Temp 98.2 F (36.8 C) (Temporal)   Ht 6' (1.829 m)   Wt 137 lb (62.1 kg)   SpO2 98%   BMI 18.58 kg/m   Wt Readings from Last 5 Encounters:  12/08/19 137 lb (62.1 kg)  10/13/19 135 lb (61.2 kg)  04/21/19 140 lb (63.5 kg)  04/10/19 140 lb (63.5 kg)  02/12/19 133 lb 11.2 oz (60.6 kg)    BMI Readings from Last 5 Encounters:  12/08/19 18.58 kg/m  10/13/19 18.31 kg/m  04/21/19 18.99  kg/m  04/10/19 18.99 kg/m  02/12/19 18.65 kg/m     Physical Exam Vitals and nursing note reviewed.  Constitutional:      General: He is not in acute distress.    Appearance: Normal appearance. He is normal weight.  HENT:     Head: Normocephalic and atraumatic.     Right Ear: Hearing, tympanic membrane, ear canal and external ear normal. There is no impacted cerumen.     Left Ear: Hearing, tympanic membrane, ear canal and external ear normal. There is no impacted cerumen.     Nose: Nose normal. No mucosal edema.     Right Nostril: Occlusion present.     Right Turbinates: Enlarged and swollen.     Left Turbinates: Not enlarged.     Mouth/Throat:     Mouth: Mucous membranes are dry.     Pharynx: Oropharynx is clear. No oropharyngeal exudate.  Eyes:     Pupils: Pupils are equal, round, and reactive to light.  Cardiovascular:     Rate and Rhythm: Normal rate and regular rhythm.     Pulses: Normal pulses.     Heart sounds: Normal heart sounds. No murmur.  Pulmonary:     Effort: Pulmonary effort is normal.     Breath sounds: Normal breath sounds. No decreased breath sounds, wheezing or rales.  Abdominal:     General: Bowel sounds are normal. There is no distension.     Palpations: Abdomen is soft.     Tenderness: There is no abdominal tenderness.  Musculoskeletal:     Cervical back: Normal range of motion.     Right lower leg: No edema.     Left lower leg: No edema.  Lymphadenopathy:     Cervical: No cervical adenopathy.  Skin:    General: Skin is warm and dry.     Capillary Refill: Capillary refill takes less than 2 seconds.     Findings: No erythema or rash.  Neurological:     General: No focal deficit present.     Mental Status: He is alert and oriented to person, place, and time.     Motor: No weakness.     Coordination: Coordination normal.     Gait: Gait is intact. Gait normal.  Psychiatric:        Mood and Affect: Mood normal.        Behavior: Behavior normal.  Behavior is cooperative.  Thought Content: Thought content normal.        Judgment: Judgment normal.       Assessment & Plan:   Centrilobular emphysema ([place]) Plan: Continue Trelegy Ellipta Emphasized need to stop smoking Continue to work with clinical pharmacy team Patient needs pulmonary function testing We will repeat CT of chest in February/2021 Walk today  Abnormal findings on diagnostic imaging of lung Plan: We will repeat CT chest in February/2021   Medication management Plan: Continue to work with the clinical pharmacy team Tolerating Trelegy Ellipta well  Pseudomonas aeruginosa infection Plan: Sputum cup provided today Patient to provide sample sometime over the next 1 to 2 months   Tobacco abuse Plan: Continue to work on stopping smoking Continue Wellbutrin Continue nicotine replacement therapies Set a quit date Continue to work with the clinical pharmacy team    Return in about 2 months (around 02/05/2020), or if symptoms worsen or fail to improve, for Follow up with Dr. Lamonte Sakai, Follow up with Wyn Quaker FNP-C, Follow up for PFT, After Chest CT.   Lauraine Rinne, NP 12/08/2019   This appointment was 32 minutes long of patient care.

## 2019-12-08 ENCOUNTER — Other Ambulatory Visit: Payer: Self-pay

## 2019-12-08 ENCOUNTER — Encounter: Payer: Self-pay | Admitting: Pulmonary Disease

## 2019-12-08 ENCOUNTER — Ambulatory Visit: Payer: BC Managed Care – PPO | Admitting: Pulmonary Disease

## 2019-12-08 VITALS — BP 138/88 | HR 84 | Temp 98.2°F | Ht 72.0 in | Wt 137.0 lb

## 2019-12-08 DIAGNOSIS — F17210 Nicotine dependence, cigarettes, uncomplicated: Secondary | ICD-10-CM

## 2019-12-08 DIAGNOSIS — J432 Centrilobular emphysema: Secondary | ICD-10-CM

## 2019-12-08 DIAGNOSIS — R918 Other nonspecific abnormal finding of lung field: Secondary | ICD-10-CM | POA: Diagnosis not present

## 2019-12-08 DIAGNOSIS — Z79899 Other long term (current) drug therapy: Secondary | ICD-10-CM | POA: Diagnosis not present

## 2019-12-08 DIAGNOSIS — A498 Other bacterial infections of unspecified site: Secondary | ICD-10-CM

## 2019-12-08 DIAGNOSIS — Z720 Tobacco use: Secondary | ICD-10-CM

## 2019-12-08 DIAGNOSIS — J3489 Other specified disorders of nose and nasal sinuses: Secondary | ICD-10-CM

## 2019-12-08 NOTE — Assessment & Plan Note (Signed)
Plan: Continue to work on stopping smoking Continue Wellbutrin Continue nicotine replacement therapies Set a quit date Continue to work with the clinical pharmacy team

## 2019-12-08 NOTE — Assessment & Plan Note (Signed)
Plan: We will repeat CT chest in February/2021

## 2019-12-08 NOTE — Patient Instructions (Addendum)
You were seen today by Coral Ceo, NP  for:   1. Centrilobular emphysema (HCC)  Walk today   Trelegy Ellipta  >>> 1 puff daily in the morning >>>rinse mouth out after use  >>> This inhaler contains 3 medications that help manage her respiratory status, contact our office if you cannot afford this medication or unable to remain on this medication  Only use your albuterol as a rescue medication to be used if you can't catch your breath by resting or doing a relaxed purse lip breathing pattern.  - The less you use it, the better it will work when you need it. - Ok to use up to 2 puffs  every 4 hours if you must but call for immediate appointment if use goes up over your usual need - Don't leave home without it !!  (think of it like the spare tire for your car)   Note your daily symptoms > remember "red flags" for COPD:   >>>Increase in cough >>>increase in sputum production >>>increase in shortness of breath or activity  intolerance.   If you notice these symptoms, please call the office to be seen.    2. Abnormal findings on diagnostic imaging of lung  - CT Chest Wo Contrast; Future  Complete CT of chest in February/2021 as follow-up from August/2020 CT  3. Tobacco abuse  Continue to work with clinical pharmacy team  Continue Wellbutrin Continue nicotine patches Continue nicotine lozenges  We recommend that you stop smoking.  >>>You need to set a quit date >>>If you have friends or family who smoke, let them know you are trying to quit and not to smoke around you or in your living environment  Smoking Cessation Resources:  1 800 QUIT NOW  >>> Patient to call this resource and utilize it to help support her quit smoking >>> Keep up your hard work with stopping smoking  You can also contact the [facility] >>>For smoking cessation classes call [phone]  We do not recommend using e-cigarettes as a form of stopping smoking  You can sign up for smoking  cessation support texts and information:  >>>https://smokefree.gov/smokefreetxt   4. Medication management  Continue to work with the clinical pharmacy team  5. Pseudomonas aeruginosa infection  Sputum cup provided today    We recommend today:  Orders Placed This Encounter  Procedures  . CT Chest Wo Contrast    Standing Status:   Future    Standing Expiration Date:   02/04/2021    Scheduling Instructions:     Schedule February 2021, follow up from august 2020 ct    Order Specific Question:   Preferred imaging location?    Answer:   [place] [facility] - [facility]    Order Specific Question:   Radiology Contrast Protocol - do NOT remove file path    Answer:   [email or web address]   Orders Placed This Encounter  Procedures  . CT Chest Wo Contrast   No orders of the defined types were placed in this encounter.   Follow Up:     Return in about 2 months (around 02/05/2020), or if symptoms worsen or fail to improve, for Follow up with Dr. Delton Coombes, Follow up with Elisha Headland FNP-C, Follow up for PFT, After Chest CT.   Please do your part to reduce the spread of COVID-19:      Reduce your risk of any infection  and COVID19 by using the similar precautions used for avoiding the common  cold or flu:  Marland Kitchen Wash your hands often with soap and warm water for at least 20 seconds.  If soap and water are not readily available, use an alcohol-based hand sanitizer with at least 60% alcohol.  . If coughing or sneezing, cover your mouth and nose by coughing or sneezing into the elbow areas of your shirt or coat, into a tissue or into your sleeve (not your hands). Langley Gauss A MASK when in public  . Avoid shaking hands with others and consider head nods or verbal greetings only. . Avoid touching your eyes, nose, or mouth with unwashed hands.  . Avoid close contact with people who are sick. . Avoid places or events with large numbers of people in one location, like concerts or sporting  events. . If you have some symptoms but not all symptoms, continue to monitor at home and seek medical attention if your symptoms worsen. . If you are having a medical emergency, call 911.   [place] / e-Visit: [email or web address]         [facility]: [facility]: [phone]                   [facility]: [phone]     It is flu season:   >>> Best ways to protect herself from the flu: Receive the yearly flu vaccine, practice good hand hygiene washing with soap and also using hand sanitizer when available, eat a nutritious meals, get adequate rest, hydrate appropriately   Please contact the office if your symptoms worsen or you have concerns that you are not improving.   Thank you for choosing [facility] for your healthcare, and for allowing [place] to partner with you on your healthcare journey. I am thankful to be able to provide care to you today.   Wyn Quaker FNP-C

## 2019-12-08 NOTE — Assessment & Plan Note (Signed)
Plan: Continue to work with the clinical pharmacy team Tolerating Trelegy Ellipta well

## 2019-12-08 NOTE — Assessment & Plan Note (Addendum)
Plan: Sputum cup provided today Patient to provide sample sometime over the next 1 to 2 months

## 2019-12-08 NOTE — Assessment & Plan Note (Signed)
Plan: Continue Trelegy Ellipta Emphasized need to stop smoking Continue to work with clinical pharmacy team Patient needs pulmonary function testing We will repeat CT of chest in February/2021 Walk today

## 2019-12-13 ENCOUNTER — Other Ambulatory Visit (HOSPITAL_COMMUNITY): Payer: BC Managed Care – PPO

## 2019-12-15 ENCOUNTER — Telehealth: Payer: Self-pay | Admitting: Pharmacist

## 2019-12-15 NOTE — Telephone Encounter (Signed)
Called patient on 12/15/2019 at 9:49 AM. Unable toleaveHIPAA-compliant VM with [facility] box is not set up.  Plan to discuss smoking cessation  Will re-attempt to contact patient in 1-2 weeks  Zachery Conch, PharmD PGY2 Ambulatory Care Pharmacy Resident

## 2019-12-22 ENCOUNTER — Telehealth: Payer: Self-pay | Admitting: Pharmacist

## 2019-12-22 ENCOUNTER — Telehealth (INDEPENDENT_AMBULATORY_CARE_PROVIDER_SITE_OTHER): Payer: Self-pay | Admitting: Pharmacist

## 2019-12-22 NOTE — Telephone Encounter (Signed)
Please ignore other note -- entered under incorrect context.  Called patient on 12/22/2019 at 10:22 AM. Unable toleaveHIPAA-compliant VM with instructions to [facility] considering VM box is not set up.  Plan to discuss smoking cessation  Will re-attempt to contact patient in 1-2 weeks  Zachery Conch, PharmD PGY2 Ambulatory Care Pharmacy Resident

## 2019-12-22 NOTE — Telephone Encounter (Signed)
Called patient on 12/22/2019 at 10:20 AM. Unable toleaveHIPAA-compliant VM with instructions [facility] box is not set up.  Plan to discuss smoking cessation  Will re-attempt to contact patient in 1-2 weeks  Zachery Conch, PharmD PGY2 Ambulatory Care Pharmacy Resident

## 2019-12-29 ENCOUNTER — Telehealth: Payer: Self-pay | Admitting: Pharmacist

## 2019-12-29 NOTE — Telephone Encounter (Signed)
Called patient on 12/29/2019 at 9:55 AM. Unable toleaveHIPAA-compliant VM with instructions to call  [facility] back considering VM box is not set up.  Third unsuccessful attempt to contact patient. Will await patient to return call to further discuss smoking cessation.  Thank you for involving pharmacy to assist in providing this patient's care.   Zachery Conch, PharmD PGY2 Ambulatory Care Pharmacy Resident

## 2019-12-29 NOTE — Telephone Encounter (Signed)
Thank you for attempting to reach the patient.Elisha Headland, FNP

## 2020-01-19 ENCOUNTER — Ambulatory Visit (INDEPENDENT_AMBULATORY_CARE_PROVIDER_SITE_OTHER)
Admission: RE | Admit: 2020-01-19 | Discharge: 2020-01-19 | Disposition: A | Discharge status: Home / Self Care | Payer: BC Managed Care – PPO | Source: Ambulatory Visit | Attending: Pulmonary Disease | Admitting: Pulmonary Disease

## 2020-01-19 ENCOUNTER — Other Ambulatory Visit: Payer: Self-pay | Admitting: Emergency Medicine

## 2020-01-19 ENCOUNTER — Other Ambulatory Visit: Payer: Self-pay

## 2020-01-19 DIAGNOSIS — R918 Other nonspecific abnormal finding of lung field: Secondary | ICD-10-CM | POA: Diagnosis not present

## 2020-01-19 DIAGNOSIS — I2510 Atherosclerotic heart disease of native coronary artery without angina pectoris: Secondary | ICD-10-CM | POA: Diagnosis not present

## 2020-01-19 DIAGNOSIS — I700 Atherosclerosis of aorta: Secondary | ICD-10-CM | POA: Diagnosis not present

## 2020-01-19 DIAGNOSIS — R911 Solitary pulmonary nodule: Secondary | ICD-10-CM

## 2020-01-19 DIAGNOSIS — J9809 Other diseases of bronchus, not elsewhere classified: Secondary | ICD-10-CM | POA: Diagnosis not present

## 2020-01-19 DIAGNOSIS — J432 Centrilobular emphysema: Secondary | ICD-10-CM | POA: Diagnosis not present

## 2020-01-19 NOTE — Progress Notes (Signed)
Patient CT chest results of come back.  9 mm right lower lobe pulmonary nodule is stable.  There is a new nodular area in the left upper lobe.  Because of this area we will repeat another CT chest in 3 months.Please place order for CT chest without contrast to be completed in 3 months from the CT.Please have patient schedule a follow-up with Dr. Delton Coombes to further review the CT. okay to replace appointment as already scheduled with me in March/2021 for an appointment with Dr. Delton Coombes.If nothing is available then keep scheduled appointment with me in March to further review the CT and work-up.Once again it is extremely important that the patient stop smoking.Sean Headland, FNP

## 2020-02-05 ENCOUNTER — Ambulatory Visit: Payer: BC Managed Care – PPO | Admitting: Pulmonary Disease

## 2020-02-09 ENCOUNTER — Other Ambulatory Visit: Payer: Self-pay

## 2020-02-09 ENCOUNTER — Encounter: Payer: Self-pay | Admitting: Emergency Medicine

## 2020-02-09 ENCOUNTER — Ambulatory Visit: Payer: BC Managed Care – PPO | Admitting: Emergency Medicine

## 2020-02-09 VITALS — BP 138/86 | HR 90 | Temp 97.0°F | Ht 72.0 in | Wt 140.6 lb

## 2020-02-09 DIAGNOSIS — I2510 Atherosclerotic heart disease of native coronary artery without angina pectoris: Secondary | ICD-10-CM | POA: Diagnosis not present

## 2020-02-09 DIAGNOSIS — I2584 Coronary atherosclerosis due to calcified coronary lesion: Secondary | ICD-10-CM

## 2020-02-09 DIAGNOSIS — R590 Localized enlarged lymph nodes: Secondary | ICD-10-CM | POA: Diagnosis not present

## 2020-02-09 DIAGNOSIS — R911 Solitary pulmonary nodule: Secondary | ICD-10-CM | POA: Diagnosis not present

## 2020-02-09 DIAGNOSIS — Z720 Tobacco use: Secondary | ICD-10-CM | POA: Diagnosis not present

## 2020-02-09 MED ORDER — ALBUTEROL SULFATE HFA 108 (90 BASE) MCG/ACT IN AERS: 2.0000 | INHALATION_SPRAY | Freq: Four times a day (QID) | RESPIRATORY_TRACT | 5 refills | Status: DC | PRN

## 2020-02-09 NOTE — Assessment & Plan Note (Signed)
He has cut down significantly, and is interested in stopping.  He is using Wellbutrin, nicotine supplements.  He thinks he can probably set a quit date by our next visit.  Encouraged him with this.

## 2020-02-09 NOTE — Assessment & Plan Note (Signed)
Noted on his CT chest.  Discussed the potential ramifications of this with him today.  He is interested in risk stratification with cardiology.  I will make the referral today.

## 2020-02-09 NOTE — Progress Notes (Addendum)
Subjective:    Patient ID: Sean Hull, male    DOB: 1961/03/07, 59 y.o.   MRN: [id number]  HPI 59 year old smoker (40 pack years) with history of substance abuse (cocaine) admitted to [facility] for pneumonia between 2/22 and 2/25.  He was hypoxemic and required BiPAP support, was not intubated.  He was treated with antibiotics and improved, discharged.  A CT scan of his chest was done during that hospitalization which I have reviewed.  This shows profound emphysematous change, bilateral lower lobe predominant infiltrates consistent with either pneumonia or pneumonitis superimposed on his emphysematous disease.  Consider also atypical infection.  He had fairly bulky mediastinal lymphadenopathy principally on the left.  Question was also raised of possible esophageal lesion.  He presents today for further evaluation of his abnormal CT scan of the chest.  He still feels washed out, breathing a bit better. Cough is better. He has lost about 30 lbs in the last year. He has decreased his cigarettes. He has some dysphagia, sometimes food gets stuck. Sometimes hurts to swallow liquids.   ROV 02/09/2020 --follow-up visit for 59 year old smoker with history of substance abuse.  I saw him a year ago in the aftermath of CAP and associated respiratory failure (required BiPAP).  He had profound emphysema, pulmonary infiltrates and mediastinal lymphadenopathy on CT chest at the time.  EBUS negative for malignancy 02/12/2019.  A repeat scan from 07/08/2019 reviewed by me showed resolution of the pneumonia, stable 9 mm right lower lobe pulmonary nodule, resolved adenopathy.  He had a repeat scan done to follow the pulmonary nodule 01/19/2020 which I have reviewed, the pulmonary nodule stable in size, there is a new anterior left upper lobe nodular opacity 1.7 x 0.9 cm of unclear etiology, possibly scar versus vasculature.  Currently on Trelegy - feels that it helps him some. He has significant exertional dyspnea. Some cough  and wheeze, producing yellowish mucous for 2-3 weeks. No fevers. Doesn't have an albuterol right now. Has not needed duoneb.    Review of Systems  Constitutional: Positive for unexpected weight change. Negative for fever.  HENT: Positive for congestion, sore throat and trouble swallowing. Negative for dental problem, ear pain, nosebleeds, postnasal drip, rhinorrhea, sinus pressure and sneezing.   Eyes: Negative for redness and itching.  Respiratory: Positive for cough and shortness of breath. Negative for chest tightness and wheezing.   Cardiovascular: Negative for palpitations and leg swelling.  Gastrointestinal: Negative for nausea and vomiting.  Genitourinary: Negative for dysuria.  Musculoskeletal: Negative for joint swelling.  Skin: Negative for rash.  Neurological: Negative for headaches.  Hematological: Does not bruise/bleed easily.  Psychiatric/Behavioral: Negative for dysphoric mood. The patient is not nervous/anxious.    Past Medical History:  Diagnosis Date  . Acute respiratory failure with hypoxia (HCC)   . Arthritis    hands  . CAP (community acquired pneumonia)   . COPD (chronic obstructive pulmonary disease) (HCC)   . Dyspnea    with exertion  . Pneumonia   . Substance use disorder   . Tobacco use      Family History  Problem Relation Age of Onset  . Colon cancer Neg Hx   . Esophageal cancer Neg Hx   . Colon polyps Neg Hx   . Rectal cancer Neg Hx   . Stomach cancer Neg Hx      Social History   Socioeconomic History  . Marital status: Widowed    Spouse name: Not on file  . Number of children:  Not on file  . Years of education: Not on file  . Highest education level: Not on file  Occupational History  . Not on file  Tobacco Use  . Smoking status: Current Every Day Smoker    Packs/day: 1.50    Years: 40.00    Pack years: 60.00  . Smokeless tobacco: Never Used  . Tobacco comment: 3-8 cigs a day   Substance and Sexual Activity  . Alcohol use: Yes     Alcohol/week: 14.0 standard drinks    Types: 14 Cans of beer per week    Comment: per week  . Drug use: Not Currently    Types: "Crack" cocaine    Comment: crack - last time  1 month or so ago- it was days before hospitalization in [place], 01/25/2019.  Marland Kitchen Sexual activity: Not on file  Other Topics Concern  . Not on file  Social History Narrative  . Not on file   Social Determinants of Health   Financial Resource Strain:   . Difficulty of Paying Living Expenses: Not on file  Food Insecurity:   . Worried About Charity fundraiser in the Last Year: Not on file  . Ran Out of Food in the Last Year: Not on file  Transportation Needs:   . Lack of Transportation (Medical): Not on file  . Lack of Transportation (Non-Medical): Not on file  Physical Activity:   . Days of Exercise per Week: Not on file  . Minutes of Exercise per Session: Not on file  Stress:   . Feeling of Stress : Not on file  Social Connections:   . Frequency of Communication with Friends and Family: Not on file  . Frequency of Social Gatherings with Friends and Family: Not on file  . Attends Religious Services: Not on file  . Active Member of Clubs or Organizations: Not on file  . Attends Archivist Meetings: Not on file  . Marital Status: Not on file  Intimate Partner Violence:   . Fear of Current or Ex-Partner: Not on file  . Emotionally Abused: Not on file  . Physically Abused: Not on file  . Sexually Abused: Not on file     No Known Allergies   Outpatient Medications Prior to Visit  Medication Sig Dispense Refill  . budesonide (PULMICORT) 0.5 MG/2ML nebulizer solution Take 0.5 mg by nebulization 2 (two) times daily.     Marland Kitchen buPROPion (WELLBUTRIN XL) 150 MG 24 hr tablet Take 1 tablet (150 mg total) by mouth daily. (Use one tablet per day for 3 days then increase to 2 tablets daily) 60 tablet 11  . Fluticasone-Umeclidin-Vilant (TRELEGY ELLIPTA) 100-62.5-25 MCG/INH AEPB Inhale 1 puff into the lungs  daily. 60 each 3  . ibuprofen (ADVIL,MOTRIN) 400 MG tablet Take 400 mg by mouth 2 (two) times daily as needed.    Marland Kitchen ipratropium-albuterol (DUONEB) 0.5-2.5 (3) MG/3ML SOLN Take 3 mLs by nebulization every 6 (six) hours as needed (shortness of breath or wheezing).     . nicotine (NICODERM CQ - DOSED IN MG/24 HOURS) 21 mg/24hr patch Place 1 patch (21 mg total) onto the skin daily. 28 patch 11  . nicotine polacrilex (NICOTINE MINI) 4 MG lozenge Take 1 lozenge (4 mg total) by mouth as needed for smoking cessation. 300 tablet 30  . PROAIR HFA 108 (90 Base) MCG/ACT inhaler Inhale 1-2 puffs into the lungs every 6 (six) hours as needed for wheezing or shortness of breath.      No  facility-administered medications prior to visit.        Objective:   Physical Exam  Vitals:   02/09/20 1502  BP: 138/86  Pulse: 90  Temp: (!) 97 F (36.1 C)  TempSrc: Temporal  SpO2: 95%  Weight: 140 lb 9.6 oz (63.8 kg)  Height: 6' (1.829 m)   Gen: Pleasant, very thin, in no distress,  normal affect  ENT: No lesions,  mouth clear,  Some white exudate on uvula, no postnasal drip  Neck: No JVD, no stridor  Lungs: No use of accessory muscles, distant, no wheeze  Cardiovascular: RRR, heart sounds normal, no murmur or gallops, no peripheral edema  Musculoskeletal: No deformities, no cyanosis or clubbing  Neuro: alert, awake, non focal  Skin: Warm, no lesions or rash     Assessment & Plan:  Mediastinal lymphadenopathy Resolved.  EBUS negative  Centrilobular emphysema (HCC) Severe COPD.  He is on Trelegy, still has residual symptoms.  Did not desaturate with exertion.  He needs an albuterol inhaler.  Understands importance of smoking cessation as below.  Tobacco abuse He has cut down significantly, and is interested in stopping.  He is using Wellbutrin, nicotine supplements.  He thinks he can probably set a quit date by our next visit.  Encouraged him with this.  Abnormal findings on diagnostic imaging  of lung 9 mm right lower lobe nodule is stable on most recent CT.  He also has a left apical opacity, new, unclear significance.  We will repeat his CT chest in May 2021, follow-up to review  Coronary artery calcification Noted on his CT chest.  Discussed the potential ramifications of this with him today.  He is interested in risk stratification with cardiology.  I will make the referral today.  Levy Pupa, MD, PhD 02/09/2020, 3:35 PM [facility] [phone] or if no answer [phone]

## 2020-02-09 NOTE — Assessment & Plan Note (Signed)
9 mm right lower lobe nodule is stable on most recent CT.  He also has a left apical opacity, new, unclear significance.  We will repeat his CT chest in May 2021, follow-up to review

## 2020-02-09 NOTE — Assessment & Plan Note (Signed)
Severe COPD.  He is on Trelegy, still has residual symptoms.  Did not desaturate with exertion.  He needs an albuterol inhaler.  Understands importance of smoking cessation as below.

## 2020-02-09 NOTE — Assessment & Plan Note (Signed)
Resolved.  EBUS negative

## 2020-02-09 NOTE — Patient Instructions (Signed)
Please continue your Trelegy 1 inhalation once daily.  Rinse and gargle after using. We will prescribe albuterol.  Use 2 puffs up to every 4 hours if needed for shortness of breath, chest tightness, wheezing. Continue to work hard on decreasing your cigarettes.  You are close to the point where you can think about setting a quit date.  We will discuss further next time.  Continue Wellbutrin and nicotine as you have been using them We will repeat your CT scan of the chest in May 2021. Follow-up with Dr. Delton Coombes in May to review your CT results or sooner if you have any problems.

## 2020-04-07 ENCOUNTER — Other Ambulatory Visit: Payer: Self-pay

## 2020-04-07 ENCOUNTER — Ambulatory Visit (INDEPENDENT_AMBULATORY_CARE_PROVIDER_SITE_OTHER)
Admission: RE | Admit: 2020-04-07 | Discharge: 2020-04-07 | Disposition: A | Discharge status: Home / Self Care | Payer: BC Managed Care – PPO | Source: Ambulatory Visit | Attending: Emergency Medicine | Admitting: Emergency Medicine

## 2020-04-07 DIAGNOSIS — R911 Solitary pulmonary nodule: Secondary | ICD-10-CM

## 2020-04-07 DIAGNOSIS — J432 Centrilobular emphysema: Secondary | ICD-10-CM | POA: Diagnosis not present

## 2020-04-12 ENCOUNTER — Encounter: Payer: Self-pay | Admitting: Pulmonary Disease

## 2020-04-12 ENCOUNTER — Ambulatory Visit (INDEPENDENT_AMBULATORY_CARE_PROVIDER_SITE_OTHER): Payer: BC Managed Care – PPO | Admitting: Pulmonary Disease

## 2020-04-12 ENCOUNTER — Other Ambulatory Visit: Payer: Self-pay

## 2020-04-12 VITALS — BP 156/80 | HR 74 | Temp 98.0°F | Ht 71.0 in | Wt 136.6 lb

## 2020-04-12 DIAGNOSIS — Z720 Tobacco use: Secondary | ICD-10-CM

## 2020-04-12 DIAGNOSIS — J432 Centrilobular emphysema: Secondary | ICD-10-CM | POA: Diagnosis not present

## 2020-04-12 DIAGNOSIS — I10 Essential (primary) hypertension: Secondary | ICD-10-CM

## 2020-04-12 DIAGNOSIS — Z0000 Encounter for general adult medical examination without abnormal findings: Secondary | ICD-10-CM

## 2020-04-12 DIAGNOSIS — I2584 Coronary atherosclerosis due to calcified coronary lesion: Secondary | ICD-10-CM

## 2020-04-12 DIAGNOSIS — R590 Localized enlarged lymph nodes: Secondary | ICD-10-CM

## 2020-04-12 DIAGNOSIS — R918 Other nonspecific abnormal finding of lung field: Secondary | ICD-10-CM

## 2020-04-12 DIAGNOSIS — I2510 Atherosclerotic heart disease of native coronary artery without angina pectoris: Secondary | ICD-10-CM

## 2020-04-12 NOTE — Patient Instructions (Addendum)
You were seen today by Lauraine Rinne, NP  for:   1. Centrilobular emphysema (HCC)  Trelegy Ellipta  >>> 1 puff daily in the morning >>>rinse mouth out after use  >>> This inhaler contains 3 medications that help manage her respiratory status, contact our office if you cannot afford this medication or unable to remain on this medication  Only use your albuterol as a rescue medication to be used if you can't catch your breath by resting or doing a relaxed purse lip breathing pattern.  - The less you use it, the better it will work when you need it. - Ok to use up to 2 puffs  every 4 hours if you must but call for immediate appointment if use goes up over your usual need - Don't leave home without it !!  (think of it like the spare tire for your car)   Note your daily symptoms > remember "red flags" for COPD:   >>>Increase in cough >>>increase in sputum production >>>increase in shortness of breath or activity  intolerance.   If you notice these symptoms, please call the office to be seen.    2. Tobacco abuse  We recommend that you stop smoking.  >>>You need to set a quit date >>>If you have friends or family who smoke, let them know you are trying to quit and not to smoke around you or in your living environment  Smoking Cessation Resources:  1 800 QUIT NOW  >>> Patient to call this resource and utilize it to help support her quit smoking >>> Keep up your hard work with stopping smoking  You can also contact the [facility] >>>For smoking cessation classes call [phone]  We do not recommend using e-cigarettes as a form of stopping smoking  You can sign up for smoking cessation support texts and information:  >>>https://smokefree.gov/smokefreetxt   3. Coronary artery calcification  - Ambulatory referral to Cardiology  4. Healthcare maintenance  - Ambulatory referral to [facility]  Please call our office if no one has scheduled these appointments  for you within the next week.  Call us on 04/19/2020 or send [place] a MyChart message if you have not heard from primary care or cardiology   We recommend today:  Orders Placed This Encounter  Procedures  . Ambulatory referral to [facility]    Referral Priority:   Routine    Referral Type:   Consultation    Referral Reason:   Specialty Services Required    Requested Specialty:   Family Medicine    Number of Visits Requested:   1  . Ambulatory referral to Cardiology    Referral Priority:   Routine    Referral Type:   Consultation    Referral Reason:   Specialty Services Required    Requested Specialty:   Cardiology    Number of Visits Requested:   1   Orders Placed This Encounter  Procedures  . Ambulatory referral to [facility]  . Ambulatory referral to Cardiology   No orders of the defined types were placed in this encounter.   Follow Up:    Return in about 3 months (around 07/13/2020), or if symptoms worsen or fail to improve, for Follow up for PFT, Follow up with Wyn Quaker FNP-C, Follow up with Dr. Lamonte Sakai.   Please do your part to reduce the spread of COVID-19:      Reduce your risk of any infection  and COVID19 by using the similar  precautions used for avoiding the common cold or flu:  Marland Kitchen Wash your hands often with soap and warm water for at least 20 seconds.  If soap and water are not readily available, use an alcohol-based hand sanitizer with at least 60% alcohol.  . If coughing or sneezing, cover your mouth and nose by coughing or sneezing into the elbow areas of your shirt or coat, into a tissue or into your sleeve (not your hands). Drinda Butts A MASK when in public  . Avoid shaking hands with others and consider head nods or verbal greetings only. . Avoid touching your eyes, nose, or mouth with unwashed hands.  . Avoid close contact with people who are sick. . Avoid places or events with large numbers of people in one location, like concerts or sporting  events. . If you have some symptoms but not all symptoms, continue to monitor at home and seek medical attention if your symptoms worsen. . If you are having a medical emergency, call 911.   ADDITIONAL HEALTHCARE OPTIONS FOR PATIENTS  [place] [facility] / e-Visit: [email or web address]         [facility]: [phone]  [facility]: [phone]                   [facility]: [phone]     It is flu season:   >>> Best ways to protect herself from the flu: Receive the yearly flu vaccine, practice good hand hygiene washing with soap and also using hand sanitizer when available, eat a nutritious meals, get adequate rest, hydrate appropriately   Please contact the office if your symptoms worsen or you have concerns that you are not improving.   Thank you for choosing  [facility] for your healthcare, and for allowing [place] to partner with you on your healthcare journey. I am thankful to be able to provide care to you today.   Elisha Headland FNP-C

## 2020-04-12 NOTE — Assessment & Plan Note (Signed)
Resolved

## 2020-04-12 NOTE — Assessment & Plan Note (Signed)
Plan: Referral to primary care

## 2020-04-12 NOTE — Assessment & Plan Note (Signed)
Plan: Referred to cardiology Referred to primary care

## 2020-04-12 NOTE — Progress Notes (Signed)
_0  ID: Sean Hull, male    DOB: 1960/12/29, 59 y.o.   MRN: [id number]  Chief Complaint  Patient presents with  . Follow-up    Sob-same,cough-white, occass. wheezing    Referring provider: No ref. provider found  HPI:  59 year old male current everyday smoker initially referred to our office in March/2020.  He is followed in our office for emphysema and mediastinal lymphadenopathy  PMH: Substance abuse - crack cocaine Smoker/ Smoking History: Current everyday smoker. 0.5ppd. 60 pack years.  Maintenance: Trelegy Ellipta  Pt of: Dr. Lamonte Sakai  04/12/2020  - Visit   59 year old male current smoker followed in our office for centrilobular emphysema.  Last seen in March/2021.  He was encouraged to remain on Trelegy Ellipta at that time.  It was emphasized that he needs to stop smoking completely.  Plan is to repeat a CT scan of his chest in May/2021 to follow the 9 mm right lower lobe nodule.  04/07/2020-CT chest super D-nodular consolidation previously seen anterior left upper lobe has nearly completely resolved in the interval, peripheral right lower lobe nodule has a type to the adjacent pleura stable from baseline exam 11/25/2019 and is likely a subpleural lymph node, 2 years of documented stability is suggested as malignancy cannot be excluded, additional areas of peribronchial vascular nodularity bilaterally new from 01/09/2020 favoring infectious bronchiolitis.  Enlarged pulmonary arteries indicative of pulmonary arterial hypertension, emphysema  Patient reports he is doing well since last being seen.  He feels that he is at his baseline of smoking and symptoms.  He is averaging around 6 cigarettes a day.  He is working towards his quit date of 05/16/2020.  We had attempted in the past to refer him to our clinical pharmacy team here but they were unable to get in touch with the patient so the referral was closed.  Patient is currently using a reduced to quit method.  Patient continues  to have aspects of shortness of breath, fatigue as well as cough.  He feels that his cough as well as sputum production are at baseline at baseline color.  He uses his rescue nearly 3 times a day.  Patient currently does not have a primary care provider.  He has been referred in the past to cardiology due to coronary artery calcification on CT imaging.  This was never completed.  Patient had pulmonary function testing ordered November/2020 this still has not been completed.  Questionaires / Pulmonary Flowsheets:   MMRC: mMRC Dyspnea Scale mMRC Score  04/12/2020 3  02/09/2020 3  12/08/2019 3   Tests:   02/12/2019 -EBUS, bronchoscopy Specimen 1 of 4, no malignant cells identified Fine-needle aspiration, EBUS 4R node, specimen 2 of 4, no malignant cells identified, and lymphoid tissue present Fine-needle aspiration endoscopic specimen EBUS 7 node specimen 3 of 4, no malignant cells identified, and lymphoid tissue present Bronchial lavage specimen right lower lobe specimen 4 of 4, no malignant cells identified BAL No fungus isolated after 21 days BAL-Pseudomonas Aerignosa, pansensitive AFB-negative  07/08/2019-CT chest without contrast-resolution of previously demonstrated bilateral pneumonia, persistent but stable 9 mm pulmonary nodule right lung base, a 6 to 51-monthfollow-up CT is recommended to confirm stability, severe emphysematous changes  04/07/2020-CT chest super D-nodular consolidation previously seen anterior left upper lobe has nearly completely resolved in the interval, peripheral right lower lobe nodule has a tail to the adjacent pleura stable from baseline exam 11/25/2019 and is likely a subpleural lymph node, 2 years of documented stability is suggested  as malignancy cannot be excluded, additional areas of peribronchial vascular nodularity bilaterally new from 01/09/2020 favoring infectious bronchiolitis.  Enlarged pulmonary arteries indicative of pulmonary arterial hypertension,  emphysema   FENO:  No results found for: NITRICOXIDE  PFT: No flowsheet data found.  WALK:  SIX MIN WALK 12/08/2019  Supplimental Oxygen during Test? (L/min) No  Tech Comments: Patient was able to walk 2 laps at a steady pace. Stated he felt fine during the first lap but towards the end of the 2nd lap, he had some chest tightness. Denied any chest or leg pain. No O2 was needed during or after walk.    Imaging: CT Super D Chest Wo Contrast  Result Date: 04/07/2020 CLINICAL DATA:  Lung nodule.  Smoker. EXAM: CT CHEST WITHOUT CONTRAST TECHNIQUE: Multidetector CT imaging of the chest was performed using thin slice collimation for electromagnetic bronchoscopy planning purposes, without intravenous contrast. COMPARISON:  01/19/2020, 07/08/2019 and 01/25/2019. FINDINGS: Cardiovascular: Atherosclerotic calcification of the aorta and coronary arteries. Right and left pulmonary arteries are enlarged. Heart size normal. No pericardial effusion. Mediastinum/Nodes: Mediastinal lymph nodes measure up to 12 mm in the low left paratracheal station, unchanged. Hilar regions are difficult to definitively evaluate without IV contrast. No axillary adenopathy. There may be distal esophageal wall thickening which can be seen with gastroesophageal reflux. Lungs/Pleura: Centrilobular emphysema. Near complete resolution of previously seen nodular consolidation in the anterior left upper lobe (3/47). 8 mm peripheral right lower lobe nodule (3/127) has a tag to the adjacent pleura and is unchanged from baseline examination 01/25/2019. Slight peribronchovascular nodularity in the peripheral right lower lobe (3/128-150) and anterior left lower lobe (3/90-92) is new from 01/19/2020. Similarly, a 2 mm peripheral right upper lobe nodule (3/68) and a 5 mm nodular lesion in the inferior right middle lobe (3/151) are also new. No pleural fluid. Debris is seen dependently in the airway. Upper Abdomen: Visualized portions of the liver,  adrenal glands, kidneys, spleen, pancreas, stomach and bowel are grossly unremarkable. No upper abdominal adenopathy. Musculoskeletal: Degenerative changes in the spine. No worrisome lytic or sclerotic lesions. IMPRESSION: 1. Nodular consolidation previously seen in the anterior left upper lobe has nearly completely resolved in the interval. 2. Peripheral right lower lobe nodule has a tag to the adjacent pleura, is stable from baseline examination of 01/25/2019 and is likely a subpleural lymph node. Two years of documented stability are suggested as malignancy cannot be excluded. This recommendation follows the consensus statement: Guidelines for Management of Small Pulmonary Nodules Detected on CT Images: From the Fleischner Society 2017; Radiology 2017; 284:228-243. 3. Additional areas of peribronchovascular nodularity bilaterally, new from 01/09/2020, favoring an infectious bronchiolitis or postinfectious scarring. 4. Aortic atherosclerosis (ICD10-I70.0). Coronary artery calcification. 5. Enlarged pulmonary arteries, indicative of pulmonary arterial hypertension. 6.  Emphysema (ICD10-J43.9). Electronically Signed   By: Lorin Picket M.D.   On: 04/07/2020 12:00    Lab Results:  CBC    Component Value Date/Time   WBC 7.9 02/10/2019 1138   RBC 4.36 02/10/2019 1138   HGB 13.8 02/10/2019 1138   HCT 44.1 02/10/2019 1138   PLT 343 02/10/2019 1138   MCV 101.1 (H) 02/10/2019 1138   MCH 31.7 02/10/2019 1138   MCHC 31.3 02/10/2019 1138   RDW 13.4 02/10/2019 1138    BMET    Component Value Date/Time   NA 139 02/10/2019 1138   K 4.7 02/10/2019 1138   CL 105 02/10/2019 1138   CO2 24 02/10/2019 1138   GLUCOSE 109 (H) 02/10/2019 1138  BUN 19 02/10/2019 1138   CREATININE 0.97 02/10/2019 1138   CALCIUM 9.1 02/10/2019 1138   GFRNONAA >60 02/10/2019 1138   GFRAA >60 02/10/2019 1138    BNP No results found for: BNP  ProBNP No results found for: PROBNP  Specialty Problems      Pulmonary  Problems   Centrilobular emphysema (HCC)      No Known Allergies  Immunization History  Administered Date(s) Administered  . Influenza,inj,Quad PF,6+ Mos 11/10/2019  . Pneumococcal Polysaccharide-23 11/10/2019    Past Medical History:  Diagnosis Date  . Acute respiratory failure with hypoxia ([place])   . Arthritis    hands  . CAP (community acquired pneumonia)   . COPD (chronic obstructive pulmonary disease) ([place])   . Dyspnea    with exertion  . Pneumonia   . Substance use disorder   . Tobacco use     Tobacco History: Social History   Tobacco Use  Smoking Status Current Every Day Smoker  . Packs/day: 1.50  . Years: 40.00  . Pack years: 60.00  Smokeless Tobacco Never Used  Tobacco Comment   3-8 cigs a day    Ready to quit: Yes Counseling given: Yes Comment: 3-8 cigs a day   Smoking assessment and cessation counseling  Patient currently smoking: 6 cigarettes a day I have advised the patient to quit/stop smoking as soon as possible due to high risk for multiple medical problems.  It will also be very difficult for [place] to manage patient's  respiratory symptoms and status if we continue to expose her lungs to a known irritant.  We do not advise e-cigarettes as a form of stopping smoking.  Patient is willing to quit smoking.  Quit date is 05/21/2020.  Patient is using reduced to quit method.  I have advised the patient that we can assist and have options of nicotine replacement therapy, provided smoking cessation education today, provided smoking cessation counseling, and provided cessation resources.  Follow-up next office visit office visit for assessment of smoking cessation.    Smoking cessation counseling advised for: 5 min     Outpatient Encounter Medications as of 04/12/2020  Medication Sig  . albuterol (VENTOLIN HFA) 108 (90 Base) MCG/ACT inhaler Inhale 2 puffs into the lungs every 6 (six) hours as needed for wheezing or shortness of breath.  . budesonide  (PULMICORT) 0.5 MG/2ML nebulizer solution Take 0.5 mg by nebulization 2 (two) times daily.   Marland Kitchen buPROPion (WELLBUTRIN XL) 150 MG 24 hr tablet Take 1 tablet (150 mg total) by mouth daily. (Use one tablet per day for 3 days then increase to 2 tablets daily)  . Fluticasone-Umeclidin-Vilant (TRELEGY ELLIPTA) 100-62.5-25 MCG/INH AEPB Inhale 1 puff into the lungs daily.  Marland Kitchen ibuprofen (ADVIL,MOTRIN) 400 MG tablet Take 400 mg by mouth 2 (two) times daily as needed.  Marland Kitchen ipratropium-albuterol (DUONEB) 0.5-2.5 (3) MG/3ML SOLN Take 3 mLs by nebulization every 6 (six) hours as needed (shortness of breath or wheezing).   . nicotine (NICODERM CQ - DOSED IN MG/24 HOURS) 21 mg/24hr patch Place 1 patch (21 mg total) onto the skin daily.  . nicotine polacrilex (NICOTINE MINI) 4 MG lozenge Take 1 lozenge (4 mg total) by mouth as needed for smoking cessation.  Marland Kitchen PROAIR HFA 108 (90 Base) MCG/ACT inhaler Inhale 1-2 puffs into the lungs every 6 (six) hours as needed for wheezing or shortness of breath.    No facility-administered encounter medications on file as of 04/12/2020.     Review of Systems  Review of Systems  Constitutional: Positive for fatigue. Negative for activity change, chills, fever and unexpected weight change.  HENT: Negative for postnasal drip, rhinorrhea, sinus pressure, sinus pain and sore throat.   Eyes: Negative.   Respiratory: Positive for cough and shortness of breath. Negative for wheezing.   Cardiovascular: Negative for chest pain and palpitations.  Gastrointestinal: Negative for constipation, diarrhea, nausea and vomiting.  Endocrine: Negative.   Genitourinary: Negative.   Musculoskeletal: Negative.   Skin: Negative.   Neurological: Negative for dizziness and headaches.  Psychiatric/Behavioral: Negative.  Negative for dysphoric mood. The patient is not nervous/anxious.   All other systems reviewed and are negative.    Physical Exam  BP (!) 156/80 (BP Location: Left Arm, Cuff Size:  Normal)   Pulse 74   Temp 98 F (36.7 C) (Temporal)   Ht _0  (1.803 m)   Wt 136 lb 9.6 oz (62 kg)   SpO2 97%   BMI 19.05 kg/m   Wt Readings from Last 5 Encounters:  04/12/20 136 lb 9.6 oz (62 kg)  02/09/20 140 lb 9.6 oz (63.8 kg)  12/08/19 137 lb (62.1 kg)  10/13/19 135 lb (61.2 kg)  04/21/19 140 lb (63.5 kg)    BMI Readings from Last 5 Encounters:  04/12/20 19.05 kg/m  02/09/20 19.07 kg/m  12/08/19 18.58 kg/m  10/13/19 18.31 kg/m  04/21/19 18.99 kg/m     Physical Exam Vitals and nursing note reviewed.  Constitutional:      General: He is not in acute distress.    Appearance: Normal appearance. He is normal weight.  HENT:     Head: Normocephalic and atraumatic.     Right Ear: Hearing, tympanic membrane, ear canal and external ear normal.     Left Ear: Hearing, tympanic membrane, ear canal and external ear normal.     Nose: No mucosal edema or rhinorrhea.     Right Turbinates: Not enlarged.     Left Turbinates: Not enlarged.     Comments: Right nare, nasal mass, followed by ENT    Mouth/Throat:     Mouth: Mucous membranes are dry.     Pharynx: Oropharynx is clear. No oropharyngeal exudate.  Eyes:     Pupils: Pupils are equal, round, and reactive to light.  Cardiovascular:     Rate and Rhythm: Normal rate and regular rhythm.     Pulses: Normal pulses.     Heart sounds: Normal heart sounds. No murmur.  Pulmonary:     Effort: Pulmonary effort is normal.     Breath sounds: No decreased breath sounds, wheezing or rales.     Comments: Diminished breath sounds on exam Abdominal:     General: Bowel sounds are normal. There is no distension.     Palpations: Abdomen is soft.     Tenderness: There is no abdominal tenderness.  Musculoskeletal:     Cervical back: Normal range of motion.     Right lower leg: No edema.     Left lower leg: No edema.  Lymphadenopathy:     Cervical: No cervical adenopathy.  Skin:    General: Skin is warm and dry.     Capillary  Refill: Capillary refill takes less than 2 seconds.     Findings: No erythema or rash.  Neurological:     General: No focal deficit present.     Mental Status: He is alert and oriented to person, place, and time.     Motor: No weakness.     Coordination:  Coordination normal.     Gait: Gait is intact. Gait normal.  Psychiatric:        Mood and Affect: Mood normal.        Behavior: Behavior normal. Behavior is cooperative.        Thought Content: Thought content normal.        Judgment: Judgment normal.       Assessment & Plan:   Mediastinal lymphadenopathy Resolved  Coronary artery calcification Referred again to cardiology Emphasized importance of follow-up Referred to primary care Emphasized smoking cessation  Hypertension Plan: Referred to cardiology Referred to primary care  Centrilobular emphysema ([place]) Plan: Continue Trelegy Ellipta Emphasized need to stop smoking Continue to work with clinical pharmacy team Patient needs pulmonary function testing   Tobacco abuse Current everyday smoker Smoking 6 cigarettes a day Quit date set for 05/21/2020 Using reduced to quit method  Plan: Continue to work towards your goal and quit date Most important thing for you to do for your health at this time is to stop smoking  Healthcare maintenance Plan: Referral to primary care  Abnormal findings on diagnostic imaging of lung Stable May/2021 CT chest  Plan: We will repeat CT of chest in May/2022 Patient clinically appears to be stable and at baseline.  Patient does have some peribronchial vascular nodularity bilaterally.  We will monitor clinically.  If symptoms worsen or patient has increased sputum production may need to consider antibiotic treatment.    Return in about 3 months (around 07/13/2020), or if symptoms worsen or fail to improve, for Follow up for PFT, Follow up with Wyn Quaker FNP-C, Follow up with Dr. Lamonte Sakai.   Lauraine Rinne, NP 04/12/2020   This  appointment required 32 minutes of patient care (this includes precharting, chart review, review of results, face-to-face care, etc.).

## 2020-04-12 NOTE — Assessment & Plan Note (Addendum)
Stable May/2021 CT chest  Plan: We will repeat CT of chest in May/2022 Patient clinically appears to be stable and at baseline.  Patient does have some peribronchial vascular nodularity bilaterally.  We will monitor clinically.  If symptoms worsen or patient has increased sputum production may need to consider antibiotic treatment.

## 2020-04-12 NOTE — Assessment & Plan Note (Signed)
Current everyday smoker Smoking 6 cigarettes a day Quit date set for 05/21/2020 Using reduced to quit method  Plan: Continue to work towards your goal and quit date Most important thing for you to do for your health at this time is to stop smoking

## 2020-04-12 NOTE — Assessment & Plan Note (Signed)
Plan: Continue Trelegy Ellipta Emphasized need to stop smoking Continue to work with clinical pharmacy team Patient needs pulmonary function testing

## 2020-04-12 NOTE — Assessment & Plan Note (Signed)
Referred again to cardiology Emphasized importance of follow-up Referred to primary care Emphasized smoking cessation

## 2020-06-30 ENCOUNTER — Encounter (HOSPITAL_COMMUNITY): Payer: Self-pay

## 2020-07-09 ENCOUNTER — Other Ambulatory Visit (HOSPITAL_COMMUNITY): Payer: BC Managed Care – PPO

## 2020-07-13 ENCOUNTER — Ambulatory Visit: Payer: BC Managed Care – PPO | Admitting: Pulmonary Disease

## 2020-07-30 ENCOUNTER — Telehealth: Payer: Self-pay | Admitting: General Practice

## 2020-07-30 NOTE — Telephone Encounter (Signed)
Attempted to call patient to set up new patient appointment. Unable to LVM for scheduling.

## 2021-04-14 ENCOUNTER — Other Ambulatory Visit: Payer: Self-pay | Admitting: *Deleted

## 2021-04-14 DIAGNOSIS — F17210 Nicotine dependence, cigarettes, uncomplicated: Secondary | ICD-10-CM

## 2021-05-16 ENCOUNTER — Ambulatory Visit: Payer: BC Managed Care – PPO

## 2021-05-16 ENCOUNTER — Encounter: Payer: BC Managed Care – PPO | Admitting: Acute Care

## 2021-07-04 ENCOUNTER — Telehealth: Payer: Self-pay | Admitting: Emergency Medicine

## 2021-07-04 MED ORDER — PREDNISONE 10 MG PO TABS: ORAL_TABLET | ORAL | 0 refills | Status: DC

## 2021-07-04 MED ORDER — DOXYCYCLINE HYCLATE 100 MG PO TABS: 100.0000 mg | ORAL_TABLET | Freq: Two times a day (BID) | ORAL | 0 refills | Status: DC

## 2021-07-04 NOTE — Telephone Encounter (Signed)
Have him start pred > Take 40mg  daily for 3 days, then 30mg  daily for 3 days, then 20mg  daily for 3 days, then 10mg  daily for 3 days, then stop And doxycycline 100mg  bid x 7 days.

## 2021-07-04 NOTE — Telephone Encounter (Signed)
Tried calling pt and there was no answer, and his VM was not set up, will call back.

## 2021-07-04 NOTE — Telephone Encounter (Signed)
Spoke with the pt  He states having increased SOB for the past 2-3 months  He is winded just walking room to room, wheezing and has cough with cream colored sputum  He denies any fever, chills, aches  States stopped trelegy a while back b/c did not seem to help, still uses albuterol inhaler/neb  Dr Delton Coombes, I scheduled him the next available with you, which is thurs 8/4  Is there something we can send in to help him until he comes in?  No Known Allergies

## 2021-07-04 NOTE — Telephone Encounter (Signed)
Called and spoke with patient. He verbalized understanding of recommendations. Will go ahead and send in the RXs to [facility] in [place].   Nothing further needed at time of call.

## 2021-07-07 ENCOUNTER — Other Ambulatory Visit: Payer: Self-pay

## 2021-07-07 ENCOUNTER — Encounter: Payer: Self-pay | Admitting: Emergency Medicine

## 2021-07-07 ENCOUNTER — Ambulatory Visit (INDEPENDENT_AMBULATORY_CARE_PROVIDER_SITE_OTHER): Payer: BC Managed Care – PPO | Admitting: Emergency Medicine

## 2021-07-07 DIAGNOSIS — Z720 Tobacco use: Secondary | ICD-10-CM

## 2021-07-07 DIAGNOSIS — R590 Localized enlarged lymph nodes: Secondary | ICD-10-CM

## 2021-07-07 DIAGNOSIS — J441 Chronic obstructive pulmonary disease with (acute) exacerbation: Secondary | ICD-10-CM

## 2021-07-07 DIAGNOSIS — R918 Other nonspecific abnormal finding of lung field: Secondary | ICD-10-CM | POA: Diagnosis not present

## 2021-07-07 DIAGNOSIS — J449 Chronic obstructive pulmonary disease, unspecified: Secondary | ICD-10-CM | POA: Insufficient documentation

## 2021-07-07 MED ORDER — BREZTRI AEROSPHERE 160-9-4.8 MCG/ACT IN AERO: 2.0000 | INHALATION_SPRAY | Freq: Two times a day (BID) | RESPIRATORY_TRACT | 0 refills | Status: DC

## 2021-07-07 NOTE — Assessment & Plan Note (Signed)
Discussed the need for cessation with him.

## 2021-07-07 NOTE — Assessment & Plan Note (Signed)
Right upper lobe pulmonary nodule stable in size going back to 11/25/2019.  His left-sided nodule resolved on most recent imaging 04/07/2020.  He needs another scan to document 2 years of stability.  We will order now

## 2021-07-07 NOTE — Patient Instructions (Addendum)
Please complete your prednisone and doxycycline as ordered until completely gone.  We will start Breztri 2 puffs twice a day.  Rinse and gargle after using. Keep your albuterol available use 2 puffs with due to shortness of breath, chest tightness, wheezing. We will repeat your CT scan of the chest without contrast to follow your pulmonary nodule You need to continue to work to decrease her smoking.  Ultimate goal will be to stop altogether. Follow with Dr Delton Coombes or APP in 1 month to assess status on the new medication, review your CT and to ensure that you improved from this flare

## 2021-07-07 NOTE — Progress Notes (Signed)
Subjective:    Patient ID: Sean Hull, male    DOB: 1961/10/24, 60 y.o.   MRN: [id number]  HPI  ROV 02/09/2020 --follow-up visit for 60 year old smoker with history of substance abuse.  I saw him a year ago in the aftermath of CAP and associated respiratory failure (required BiPAP).  He had profound emphysema, pulmonary infiltrates and mediastinal lymphadenopathy on CT chest at the time.  EBUS negative for malignancy 02/12/2019.  A repeat scan from 07/08/2019 reviewed by me showed resolution of the pneumonia, stable 9 mm right lower lobe pulmonary nodule, resolved adenopathy.  He had a repeat scan done to follow the pulmonary nodule 01/19/2020 which I have reviewed, the pulmonary nodule stable in size, there is a new anterior left upper lobe nodular opacity 1.7 x 0.9 cm of unclear etiology, possibly scar versus vasculature.  Currently on Trelegy - feels that it helps him some. He has significant exertional dyspnea. Some cough and wheeze, producing yellowish mucous for 2-3 weeks. No fevers. Doesn't have an albuterol right now. Has not needed duoneb.   Acute OV 07/07/21 --60 year old active smoker with history of substance abuse, prior EBUS for lymphadenopathy that was negative and resolved (treated for pneumonia).  Has a stable pulmonary nodule in the right lower lobe 9 mm, last imaged 04/07/2020. He began to experience more SOB, wheeze. Some cough but mainly when he wakes up in the am. A lot of throat mucous.  I treated him with prednisone taper and doxycycline on 8/1.  Today he reports that he is benefiting some - less LE edema, less exertional SOB. He is using albuterol 2-3x a day. Stopped his Trelegy about 3 months ago, wasn't sure it was helping him much. He is smoking about 10 cig a day.     Review of Systems Past Medical History:  Diagnosis Date   Acute respiratory failure with hypoxia (HCC)    Arthritis    hands   CAP (community acquired pneumonia)    COPD (chronic obstructive pulmonary  disease) (HCC)    Dyspnea    with exertion   Pneumonia    Substance use disorder    Tobacco use      Family History  Problem Relation Age of Onset   Colon cancer Neg Hx    Esophageal cancer Neg Hx    Colon polyps Neg Hx    Rectal cancer Neg Hx    Stomach cancer Neg Hx      Social History   Socioeconomic History   Marital status: Widowed    Spouse name: Not on file   Number of children: Not on file   Years of education: Not on file   Highest education level: Not on file  Occupational History   Not on file  Tobacco Use   Smoking status: Every Day    Packs/day: 1.50    Years: 40.00    Pack years: 60.00    Types: Cigarettes   Smokeless tobacco: Never   Tobacco comments:    10 cigarettes smoked daily ARJ 07/07/21  Vaping Use   Vaping Use: Never used  Substance and Sexual Activity   Alcohol use: Yes    Alcohol/week: 14.0 standard drinks    Types: 14 Cans of beer per week    Comment: per week   Drug use: Not Currently    Types: "Crack" cocaine    Comment: crack - last time  1 month or so ago- it was days before hospitalization in [place], 01/25/2019.  Sexual activity: Not on file  Other Topics Concern   Not on file  Social History Narrative   Not on file   Social Determinants of Health   Financial Resource Strain: Not on file  Food Insecurity: Not on file  Transportation Needs: Not on file  Physical Activity: Not on file  Stress: Not on file  Social Connections: Not on file  Intimate Partner Violence: Not on file     No Known Allergies   Outpatient Medications Prior to Visit  Medication Sig Dispense Refill   albuterol (VENTOLIN HFA) 108 (90 Base) MCG/ACT inhaler Inhale 2 puffs into the lungs every 6 (six) hours as needed for wheezing or shortness of breath. 18 g 5   budesonide (PULMICORT) 0.5 MG/2ML nebulizer solution Take 0.5 mg by nebulization 2 (two) times daily.      buPROPion (WELLBUTRIN XL) 150 MG 24 hr tablet Take 1 tablet (150 mg total) by mouth  daily. (Use one tablet per day for 3 days then increase to 2 tablets daily) 60 tablet 11   doxycycline (VIBRA-TABS) 100 MG tablet Take 1 tablet (100 mg total) by mouth 2 (two) times daily. 14 tablet 0   Fluticasone-Umeclidin-Vilant (TRELEGY ELLIPTA) 100-62.5-25 MCG/INH AEPB Inhale 1 puff into the lungs daily. 60 each 3   ibuprofen (ADVIL,MOTRIN) 400 MG tablet Take 400 mg by mouth 2 (two) times daily as needed.     ipratropium-albuterol (DUONEB) 0.5-2.5 (3) MG/3ML SOLN Take 3 mLs by nebulization every 6 (six) hours as needed (shortness of breath or wheezing).      nicotine (NICODERM CQ - DOSED IN MG/24 HOURS) 21 mg/24hr patch Place 1 patch (21 mg total) onto the skin daily. 28 patch 11   nicotine polacrilex (NICOTINE MINI) 4 MG lozenge Take 1 lozenge (4 mg total) by mouth as needed for smoking cessation. 300 tablet 30   predniSONE (DELTASONE) 10 MG tablet Take 4 tabs by mouth for 3 days, then 3 for 3 days, 2 for 3 days, 1 for 3 days and stop 30 tablet 0   PROAIR HFA 108 (90 Base) MCG/ACT inhaler Inhale 1-2 puffs into the lungs every 6 (six) hours as needed for wheezing or shortness of breath.      No facility-administered medications prior to visit.        Objective:   Physical Exam  Vitals:   07/07/21 1431  BP: (!) 146/86  Pulse: (!) 107  Temp: 98.4 F (36.9 C)  TempSrc: Oral  SpO2: 95%  Weight: 146 lb (66.2 kg)  Height: 5\' 11"  (1.803 m)   Gen: Pleasant, very thin, in no distress,  normal affect  ENT: No lesions,  mouth clear,  no postnasal drip  Neck: No JVD, no stridor  Lungs: No use of accessory muscles, distant, bilateral coarse breath sounds with expiratory wheezes  Cardiovascular: RRR, heart sounds normal, no murmur or gallops, no peripheral edema  Musculoskeletal: No deformities, no cyanosis or clubbing  Neuro: alert, awake, non focal  Skin: Warm, no lesions or rash     Assessment & Plan:  COPD with acute exacerbation (HCC) Acute exacerbation of his COPD,  remains symptomatic after just starting prednisone taper and doxycycline on 8/1.  He is slightly improved.  Plan continue.  He is off schedule bronchodilator therapy, was unsure that Trelegy was benefiting him.  I do think he needs to be back on ICS/LABA/LAMA and will start Breztri.  Discussed smoking cessation with him.  He will need to follow-up in several weeks to  ensure he is clearing this exacerbation  Mediastinal lymphadenopathy Resolved  Abnormal findings on diagnostic imaging of lung Right upper lobe pulmonary nodule stable in size going back to 11/25/2019.  His left-sided nodule resolved on most recent imaging 04/07/2020.  He needs another scan to document 2 years of stability.  We will order now  Tobacco abuse Discussed the need for cessation with him.  Levy Pupa, MD, PhD 07/07/2021, 2:54 PM [facility] [phone] or if no answer [phone]

## 2021-07-07 NOTE — Addendum Note (Signed)
Addended by: Dorisann Frames R on: 07/07/2021 04:34 PM   Modules accepted: Orders

## 2021-07-07 NOTE — Assessment & Plan Note (Signed)
Acute exacerbation of his COPD, remains symptomatic after just starting prednisone taper and doxycycline on 8/1.  He is slightly improved.  Plan continue.  He is off schedule bronchodilator therapy, was unsure that Trelegy was benefiting him.  I do think he needs to be back on ICS/LABA/LAMA and will start Breztri.  Discussed smoking cessation with him.  He will need to follow-up in several weeks to ensure he is clearing this exacerbation

## 2021-07-07 NOTE — Assessment & Plan Note (Signed)
Resolved

## 2021-07-18 ENCOUNTER — Telehealth: Payer: Self-pay | Admitting: Emergency Medicine

## 2021-07-18 DIAGNOSIS — R911 Solitary pulmonary nodule: Secondary | ICD-10-CM

## 2021-07-18 MED ORDER — PREDNISONE 10 MG PO TABS: ORAL_TABLET | ORAL | 0 refills | Status: DC

## 2021-07-18 NOTE — Telephone Encounter (Signed)
Called and spoke with patient. He verbalized understanding. He has an appt to see RB on 08/16/21, he is aware of this appt.   RX for prednisone has been sent to [facility] in [place]. Nothing further needed at time of call.

## 2021-07-18 NOTE — Telephone Encounter (Signed)
Continuing to smoke is working against any meds we're using  >> he needs to stop I'm ok with treating with a repeat pred taper to address slow-resolving exacerbation > Take 40mg  daily for 3 days, then 30mg  daily for 3 days, then 20mg  daily for 3 days, then 10mg  daily for 3 days, then stop He needs to be seen by RB or APP in 2 weeks to see if he is improving

## 2021-07-18 NOTE — Telephone Encounter (Signed)
Called and spoke to pt. Pt states he has completed the doxy and pred and doesn't feel improved. Pt still c/o SOB with little energy, weakness, wheezing, chest tightness throughout the day, prod cough with brown/cream colored mucus. Pt deneis f/c/s. Pt states he is still smoking around 1/2 ppd. Super D CT chest has been ordered per last OV recs, pt aware. Last OV on 07/07/2021, seen by Dr. Delton Coombes for COPD.   Dr. Delton Coombes, please advise. Thanks!

## 2021-07-21 ENCOUNTER — Ambulatory Visit (INDEPENDENT_AMBULATORY_CARE_PROVIDER_SITE_OTHER)
Admission: RE | Admit: 2021-07-21 | Discharge: 2021-07-21 | Disposition: A | Discharge status: Home / Self Care | Payer: BC Managed Care – PPO | Source: Ambulatory Visit | Attending: Emergency Medicine | Admitting: Emergency Medicine

## 2021-07-21 ENCOUNTER — Other Ambulatory Visit: Payer: Self-pay

## 2021-07-21 DIAGNOSIS — I700 Atherosclerosis of aorta: Secondary | ICD-10-CM | POA: Diagnosis not present

## 2021-07-21 DIAGNOSIS — J439 Emphysema, unspecified: Secondary | ICD-10-CM | POA: Diagnosis not present

## 2021-07-21 DIAGNOSIS — R911 Solitary pulmonary nodule: Secondary | ICD-10-CM | POA: Diagnosis not present

## 2021-07-21 DIAGNOSIS — R918 Other nonspecific abnormal finding of lung field: Secondary | ICD-10-CM | POA: Diagnosis not present

## 2021-07-25 ENCOUNTER — Telehealth: Payer: Self-pay | Admitting: Emergency Medicine

## 2021-07-25 NOTE — Telephone Encounter (Signed)
Unable to speak with Sean Hull, as she is not listed on DPR. ATC Sean Hull to make her aware, no answer with no option to leave vm. Line rang for >26min.    Spoke to patient, who stated that he was not aware of needing referral of cardiology. He is requesting results from 07/21/2021 [facility].   Dr. Delton Coombes, please advise. Thanks.

## 2021-07-25 NOTE — Telephone Encounter (Signed)
Pt's sister was the one who was requesting the referral to cardiology. Pt was not aware of this.  What pt was requesting was the results of recent CT that was performed 8/18.

## 2021-07-25 NOTE — Telephone Encounter (Signed)
I had not planned to send the patient to cardiology based on our last visit.   If he is having CP, then agree we should refer now. Also, if he continues to have SOB after we get him back on good inhaled medication the we will refer him. We can review at his next OV.   If he is having CP, then please refer him now. Marland Kitchen

## 2021-07-26 NOTE — Telephone Encounter (Signed)
Please let him know that his pulmonary nodule is stable in size, has been stable for 2 yrs consistent with a benign finding. Good news.   We can discuss possible referral to cardiology when I see him in office if he would like. Or he could pursue this with his PCP.

## 2021-07-26 NOTE — Telephone Encounter (Signed)
Called and spoke with pt letting him know the info stated by RB and he verbalized understanding. Nothing further needed. 

## 2021-07-27 DIAGNOSIS — R1084 Generalized abdominal pain: Secondary | ICD-10-CM | POA: Diagnosis not present

## 2021-08-13 DIAGNOSIS — E440 Moderate protein-calorie malnutrition: Secondary | ICD-10-CM | POA: Diagnosis not present

## 2021-08-13 DIAGNOSIS — L97919 Non-pressure chronic ulcer of unspecified part of right lower leg with unspecified severity: Secondary | ICD-10-CM | POA: Diagnosis not present

## 2021-08-13 DIAGNOSIS — E872 Acidosis: Secondary | ICD-10-CM | POA: Diagnosis not present

## 2021-08-13 DIAGNOSIS — K2970 Gastritis, unspecified, without bleeding: Secondary | ICD-10-CM | POA: Diagnosis not present

## 2021-08-13 DIAGNOSIS — K449 Diaphragmatic hernia without obstruction or gangrene: Secondary | ICD-10-CM | POA: Diagnosis not present

## 2021-08-13 DIAGNOSIS — J189 Pneumonia, unspecified organism: Secondary | ICD-10-CM | POA: Diagnosis not present

## 2021-08-13 DIAGNOSIS — Z681 Body mass index (BMI) 19 or less, adult: Secondary | ICD-10-CM | POA: Diagnosis not present

## 2021-08-13 DIAGNOSIS — F17210 Nicotine dependence, cigarettes, uncomplicated: Secondary | ICD-10-CM | POA: Diagnosis not present

## 2021-08-13 DIAGNOSIS — R059 Cough, unspecified: Secondary | ICD-10-CM | POA: Diagnosis not present

## 2021-08-13 DIAGNOSIS — R1310 Dysphagia, unspecified: Secondary | ICD-10-CM | POA: Diagnosis not present

## 2021-08-13 DIAGNOSIS — Z716 Tobacco abuse counseling: Secondary | ICD-10-CM | POA: Diagnosis not present

## 2021-08-13 DIAGNOSIS — J441 Chronic obstructive pulmonary disease with (acute) exacerbation: Secondary | ICD-10-CM | POA: Diagnosis not present

## 2021-08-13 DIAGNOSIS — K2980 Duodenitis without bleeding: Secondary | ICD-10-CM | POA: Diagnosis not present

## 2021-08-13 DIAGNOSIS — I700 Atherosclerosis of aorta: Secondary | ICD-10-CM | POA: Diagnosis not present

## 2021-08-13 DIAGNOSIS — R0602 Shortness of breath: Secondary | ICD-10-CM | POA: Diagnosis not present

## 2021-08-13 DIAGNOSIS — E875 Hyperkalemia: Secondary | ICD-10-CM | POA: Diagnosis not present

## 2021-08-13 DIAGNOSIS — J439 Emphysema, unspecified: Secondary | ICD-10-CM | POA: Diagnosis not present

## 2021-08-13 DIAGNOSIS — K828 Other specified diseases of gallbladder: Secondary | ICD-10-CM | POA: Diagnosis not present

## 2021-08-13 DIAGNOSIS — L97929 Non-pressure chronic ulcer of unspecified part of left lower leg with unspecified severity: Secondary | ICD-10-CM | POA: Diagnosis not present

## 2021-08-13 DIAGNOSIS — R918 Other nonspecific abnormal finding of lung field: Secondary | ICD-10-CM | POA: Diagnosis not present

## 2021-08-13 DIAGNOSIS — R911 Solitary pulmonary nodule: Secondary | ICD-10-CM | POA: Diagnosis not present

## 2021-08-13 DIAGNOSIS — R634 Abnormal weight loss: Secondary | ICD-10-CM | POA: Diagnosis not present

## 2021-08-13 DIAGNOSIS — B3781 Candidal esophagitis: Secondary | ICD-10-CM | POA: Diagnosis not present

## 2021-08-13 DIAGNOSIS — I70203 Unspecified atherosclerosis of native arteries of extremities, bilateral legs: Secondary | ICD-10-CM | POA: Diagnosis not present

## 2021-08-13 DIAGNOSIS — J9601 Acute respiratory failure with hypoxia: Secondary | ICD-10-CM | POA: Diagnosis not present

## 2021-08-13 DIAGNOSIS — J449 Chronic obstructive pulmonary disease, unspecified: Secondary | ICD-10-CM | POA: Diagnosis not present

## 2021-08-15 DIAGNOSIS — J9601 Acute respiratory failure with hypoxia: Secondary | ICD-10-CM

## 2021-08-16 ENCOUNTER — Ambulatory Visit: Payer: BC Managed Care – PPO | Admitting: Emergency Medicine

## 2021-08-22 ENCOUNTER — Other Ambulatory Visit: Payer: Self-pay

## 2021-08-22 ENCOUNTER — Ambulatory Visit (INDEPENDENT_AMBULATORY_CARE_PROVIDER_SITE_OTHER): Payer: BC Managed Care – PPO | Admitting: Adult Health

## 2021-08-22 ENCOUNTER — Encounter: Payer: Self-pay | Admitting: Adult Health

## 2021-08-22 VITALS — BP 128/70 | HR 103 | Temp 98.3°F | Ht 71.0 in | Wt 129.4 lb

## 2021-08-22 DIAGNOSIS — J181 Lobar pneumonia, unspecified organism: Secondary | ICD-10-CM | POA: Diagnosis not present

## 2021-08-22 DIAGNOSIS — J189 Pneumonia, unspecified organism: Secondary | ICD-10-CM | POA: Insufficient documentation

## 2021-08-22 DIAGNOSIS — F17210 Nicotine dependence, cigarettes, uncomplicated: Secondary | ICD-10-CM

## 2021-08-22 DIAGNOSIS — F17200 Nicotine dependence, unspecified, uncomplicated: Secondary | ICD-10-CM

## 2021-08-22 DIAGNOSIS — J441 Chronic obstructive pulmonary disease with (acute) exacerbation: Secondary | ICD-10-CM

## 2021-08-22 MED ORDER — VARENICLINE TARTRATE 0.5 MG X 11 & 1 MG X 42 PO MISC: ORAL | 0 refills | Status: DC

## 2021-08-22 MED ORDER — BREZTRI AEROSPHERE 160-9-4.8 MCG/ACT IN AERO: 2.0000 | INHALATION_SPRAY | Freq: Two times a day (BID) | RESPIRATORY_TRACT | 6 refills | Status: DC

## 2021-08-22 MED ORDER — BREZTRI AEROSPHERE 160-9-4.8 MCG/ACT IN AERO: 2.0000 | INHALATION_SPRAY | Freq: Two times a day (BID) | RESPIRATORY_TRACT | 0 refills | Status: DC

## 2021-08-22 NOTE — Assessment & Plan Note (Signed)
Smoking cessation discussed in detail  Begin Chantix starter pack  Patient education given .start date /helpful hints   Plan  Patient Instructions  Refer to the low-dose CT screening program Work on not smoking Continue on Breztri 2 puffs twice daily, rinse after use Activity as tolerated Chantix starter pack .  Set up Quit date .  Follow up in 3 weeks with Dr. Delton Coombes  with chest xray and As needed   Please contact office for sooner follow up if symptoms do not improve or worsen or seek emergency care

## 2021-08-22 NOTE — Patient Instructions (Addendum)
Refer to the low-dose CT screening program Work on not smoking Continue on Breztri 2 puffs twice daily, rinse after use Activity as tolerated Chantix starter pack .  Set up Quit date .  Follow up in 3 weeks with Dr. Delton Coombes  with chest xray and As needed   Please contact office for sooner follow up if symptoms do not improve or worsen or seek emergency care

## 2021-08-22 NOTE — Assessment & Plan Note (Signed)
Reports recent hospitalization for CAP , clinically improved after antibiotics  Check chest xray on return

## 2021-08-22 NOTE — Assessment & Plan Note (Addendum)
Recent exacerbation with hospital stay - improving  Advised to finish steroids  Continue on Triple therapy .  Smoking cessation  Needs PFTs  Plan  Patient Instructions  Refer to the low-dose CT screening program Work on not smoking Continue on Breztri 2 puffs twice daily, rinse after use Activity as tolerated Chantix starter pack .  Set up Quit date .  Follow up in 3 weeks with Dr. Delton Coombes  with chest xray and As needed   Please contact office for sooner follow up if symptoms do not improve or worsen or seek emergency care

## 2021-08-22 NOTE — Progress Notes (Signed)
@Patient  ID: , male    DOB: [birth year], 60 y.o.   MRN: [id number]  Chief Complaint  Patient presents with   Hospitalization Follow-up    Referring provider: No ref. provider found  HPI: 60 year old male active smoker with a history of substance abuse followed for severe COPD with emphysema.  Abnormal CT chest with pulmonary infiltrates and mediastinal lymphadenopathy.  (Previous EBUS for lymphadenopathy negative and resolved with treatment for pneumonia  TEST/EVENTS :  CT chest July 21, 2021 stable nodule in the right lower lobe measuring 8 mm.  Small nodules in the right lower lobe are stable.  No pathologically enlarged lymph nodes.  08/22/2021 Follow up: COPD with Emphysema , Post hospital follow up  Patient presents for a follow-up visit.  Patient has underlying COPD with emphysema.  He does continue to smoke.  We discussed in detail smoking cessation.  He has tried Wellbutrin in the past unsuccessfully.  He is ready to quit smoking.  He wants to try Chantix.  He is currently not on any antidepressants.  Has no seizure disorder.  Patient education on Chantix.  Patient is aware to report unusual changes in mood, suicidal ideations or severe depression.   Was admitted last week for COPD flare and CAP at [facility] . Notes not available . Treated with antibiotics and steroids. Finished Doxycyline yesterday . Has 1 week left of prednisone . Feels some better but remains weak with low energy . Says he had Echo and labs , told not cardiac in nature.  Drinks alcohol on occasion, no drugs in >2 yrs.  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 Has been out of work a lot due to illness.  Tried Wellbutrin for smoking cessation in past , did not help with stopping smoking .     No Known Allergies  Immunization History  Administered Date(s) Administered   Influenza,inj,Quad PF,6+ Mos 11/10/2019   Pneumococcal Polysaccharide-23 11/10/2019    Past  Medical History:  Diagnosis Date   Acute respiratory failure with hypoxia (HCC)    Arthritis    hands   CAP (community acquired pneumonia)    COPD (chronic obstructive pulmonary disease) (HCC)    Dyspnea    with exertion   Pneumonia    Substance use disorder    Tobacco use     Tobacco History: Social History   Tobacco Use  Smoking Status Every Day   Packs/day: 1.50   Years: 40.00   Pack years: 60.00   Types: Cigarettes  Smokeless Tobacco Never  Tobacco Comments   Smoking 1/2 ppd as of 08/22/2021   Ready to quit: Yes Counseling given: Yes Tobacco comments: Smoking 1/2 ppd as of 08/22/2021   Outpatient Medications Prior to Visit  Medication Sig Dispense Refill   albuterol (VENTOLIN HFA) 108 (90 Base) MCG/ACT inhaler Inhale 2 puffs into the lungs every 6 (six) hours as needed for wheezing or shortness of breath. 18 g 5   Budeson-Glycopyrrol-Formoterol (BREZTRI AEROSPHERE) 160-9-4.8 MCG/ACT AERO Inhale 2 puffs into the lungs in the morning and at bedtime. 5.9 g 0   ibuprofen (ADVIL,MOTRIN) 400 MG tablet Take 400 mg by mouth 2 (two) times daily as needed.     ipratropium-albuterol (DUONEB) 0.5-2.5 (3) MG/3ML SOLN Take 3 mLs by nebulization every 6 (six) hours as needed (shortness of breath or wheezing).      PROAIR HFA 108 (90 Base) MCG/ACT inhaler Inhale 1-2 puffs into the lungs every 6 (six) hours as needed  for wheezing or shortness of breath.      budesonide (PULMICORT) 0.5 MG/2ML nebulizer solution Take 0.5 mg by nebulization 2 (two) times daily.      nicotine (NICODERM CQ - DOSED IN MG/24 HOURS) 21 mg/24hr patch Place 1 patch (21 mg total) onto the skin daily. 28 patch 11   nicotine polacrilex (NICOTINE MINI) 4 MG lozenge Take 1 lozenge (4 mg total) by mouth as needed for smoking cessation. 300 tablet 30   predniSONE (DELTASONE) 10 MG tablet Take 4 tabs by mouth for 3 days, then 3 for 3 days, 2 for 3 days, 1 for 3 days and stop 30 tablet 0   buPROPion (WELLBUTRIN XL) 150 MG  24 hr tablet Take 1 tablet (150 mg total) by mouth daily. (Use one tablet per day for 3 days then increase to 2 tablets daily) (Patient not taking: Reported on 08/22/2021) 60 tablet 11   doxycycline (VIBRA-TABS) 100 MG tablet Take 1 tablet (100 mg total) by mouth 2 (two) times daily. (Patient not taking: Reported on 08/22/2021) 14 tablet 0   fluconazole (DIFLUCAN) 100 MG tablet Take 200 mg by mouth daily. (Patient not taking: Reported on 08/22/2021)     Fluticasone-Umeclidin-Vilant (TRELEGY ELLIPTA) 100-62.5-25 MCG/INH AEPB Inhale 1 puff into the lungs daily. (Patient not taking: Reported on 08/22/2021) 60 each 3   predniSONE (DELTASONE) 10 MG tablet Take 4 tabs by mouth for 3 days, then 3 for 3 days, 2 for 3 days, 1 for 3 days and stop (Patient not taking: Reported on 08/22/2021) 30 tablet 0   No facility-administered medications prior to visit.     Review of Systems:   Constitutional:   No  weight loss, night sweats,  Fevers, chills,  +fatigue, or  lassitude.  HEENT:   No headaches,  Difficulty swallowing,  Tooth/dental problems, or  Sore throat,                No sneezing, itching, ear ache, nasal congestion, post nasal drip,   CV:  No chest pain,  Orthopnea, PND, swelling in lower extremities, anasarca, dizziness, palpitations, syncope.   GI  No heartburn, indigestion, abdominal pain, nausea, vomiting, diarrhea, change in bowel habits, loss of appetite, bloody stools.   Resp:   No chest wall deformity  Skin: no rash or lesions.  GU: no dysuria, change in color of urine, no urgency or frequency.  No flank pain, no hematuria   MS:  No joint pain or swelling.  No decreased range of motion.  No back pain.    Physical Exam  BP 128/70 (BP Location: Left Arm, Patient Position: Sitting, Cuff Size: Normal)   Pulse (!) 103   Temp 98.3 F (36.8 C) (Oral)   Ht 5\' 11"  (1.803 m)   Wt 129 lb 6.4 oz (58.7 kg)   SpO2 96%   BMI 18.05 kg/m   GEN: A/Ox3; pleasant , NAD, elderly , thin     HEENT:  [place]/AT, NOSE-clear, THROAT-clear, no lesions, no postnasal drip or exudate noted.   NECK:  Supple w/ fair ROM; no JVD; normal carotid impulses w/o bruits; no thyromegaly or nodules palpated; no lymphadenopathy.    RESP  few rhonchi,  no accessory muscle use, no dullness to percussion  CARD:  RRR, no m/r/g, no peripheral edema, pulses intact, no cyanosis or clubbing.  GI:   Soft & nt; nml bowel sounds; no organomegaly or masses detected.   Musco: Warm bil, no deformities or joint swelling noted.   Neuro:  alert, no focal deficits noted.    Skin: Warm, no lesions or rashes    Lab Results:    BNP No results found for: BNP  ProBNP No results found for: PROBNP  Imaging: No results found.    No flowsheet data found.  No results found for: NITRICOXIDE      Assessment & Plan:   COPD with acute exacerbation [facility]) Recent exacerbation with hospital stay - improving  Advised to finish steroids  Continue on Triple therapy .  Smoking cessation   Plan  Patient Instructions  Refer to the low-dose CT screening program Work on not smoking Continue on Breztri 2 puffs twice daily, rinse after use Activity as tolerated Chantix starter pack .  Set up Quit date .  Follow up in 3 weeks with Dr. Delton Coombes  with chest xray and As needed   Please contact office for sooner follow up if symptoms do not improve or worsen or seek emergency care        Tobacco abuse Smoking cessation discussed in detail  Begin Chantix starter pack  Patient education given .start date /helpful hints   Plan  Patient Instructions  Refer to the low-dose CT screening program Work on not smoking Continue on Breztri 2 puffs twice daily, rinse after use Activity as tolerated Chantix starter pack .  Set up Quit date .  Follow up in 3 weeks with Dr. Delton Coombes  with chest xray and As needed   Please contact office for sooner follow up if symptoms do not improve or worsen or seek emergency care         CAP (community acquired pneumonia) Reports recent hospitalization for CAP , clinically improved after antibiotics  Check chest xray on return      Rubye Oaks, NP 08/22/2021

## 2021-09-15 ENCOUNTER — Ambulatory Visit: Payer: BC Managed Care – PPO | Admitting: Emergency Medicine

## 2021-09-15 ENCOUNTER — Ambulatory Visit: Payer: Self-pay

## 2021-12-21 ENCOUNTER — Ambulatory Visit: Payer: Self-pay | Admitting: Adult Health

## 2021-12-23 ENCOUNTER — Ambulatory Visit: Payer: Self-pay | Admitting: Adult Health

## 2021-12-26 ENCOUNTER — Emergency Department (HOSPITAL_BASED_OUTPATIENT_CLINIC_OR_DEPARTMENT_OTHER): Payer: No Typology Code available for payment source

## 2021-12-26 ENCOUNTER — Ambulatory Visit (INDEPENDENT_AMBULATORY_CARE_PROVIDER_SITE_OTHER): Payer: Self-pay | Admitting: Adult Health

## 2021-12-26 ENCOUNTER — Other Ambulatory Visit: Payer: Self-pay

## 2021-12-26 ENCOUNTER — Emergency Department (HOSPITAL_COMMUNITY): Payer: No Typology Code available for payment source

## 2021-12-26 ENCOUNTER — Encounter: Payer: Self-pay | Admitting: Adult Health

## 2021-12-26 ENCOUNTER — Ambulatory Visit (INDEPENDENT_AMBULATORY_CARE_PROVIDER_SITE_OTHER): Payer: Self-pay

## 2021-12-26 ENCOUNTER — Inpatient Hospital Stay (HOSPITAL_COMMUNITY)
Admission: EM | Admit: 2021-12-26 | Discharge: 2021-12-29 | DRG: 291 | Disposition: A | Discharge status: Home / Self Care | Payer: No Typology Code available for payment source | Attending: Internal Medicine | Admitting: Internal Medicine

## 2021-12-26 DIAGNOSIS — I10 Essential (primary) hypertension: Secondary | ICD-10-CM | POA: Diagnosis present

## 2021-12-26 DIAGNOSIS — Z23 Encounter for immunization: Secondary | ICD-10-CM

## 2021-12-26 DIAGNOSIS — J9611 Chronic respiratory failure with hypoxia: Secondary | ICD-10-CM | POA: Diagnosis present

## 2021-12-26 DIAGNOSIS — J9601 Acute respiratory failure with hypoxia: Secondary | ICD-10-CM

## 2021-12-26 DIAGNOSIS — J181 Lobar pneumonia, unspecified organism: Secondary | ICD-10-CM

## 2021-12-26 DIAGNOSIS — R911 Solitary pulmonary nodule: Secondary | ICD-10-CM | POA: Diagnosis present

## 2021-12-26 DIAGNOSIS — E8770 Fluid overload, unspecified: Secondary | ICD-10-CM

## 2021-12-26 DIAGNOSIS — I110 Hypertensive heart disease with heart failure: Secondary | ICD-10-CM | POA: Diagnosis not present

## 2021-12-26 DIAGNOSIS — Z720 Tobacco use: Secondary | ICD-10-CM

## 2021-12-26 DIAGNOSIS — J9621 Acute and chronic respiratory failure with hypoxia: Secondary | ICD-10-CM | POA: Diagnosis present

## 2021-12-26 DIAGNOSIS — I509 Heart failure, unspecified: Secondary | ICD-10-CM

## 2021-12-26 DIAGNOSIS — Z7951 Long term (current) use of inhaled steroids: Secondary | ICD-10-CM

## 2021-12-26 DIAGNOSIS — I2584 Coronary atherosclerosis due to calcified coronary lesion: Secondary | ICD-10-CM | POA: Diagnosis present

## 2021-12-26 DIAGNOSIS — J432 Centrilobular emphysema: Secondary | ICD-10-CM | POA: Diagnosis present

## 2021-12-26 DIAGNOSIS — J441 Chronic obstructive pulmonary disease with (acute) exacerbation: Secondary | ICD-10-CM

## 2021-12-26 DIAGNOSIS — I5033 Acute on chronic diastolic (congestive) heart failure: Secondary | ICD-10-CM | POA: Diagnosis present

## 2021-12-26 DIAGNOSIS — K2289 Other specified disease of esophagus: Secondary | ICD-10-CM | POA: Insufficient documentation

## 2021-12-26 DIAGNOSIS — B3781 Candidal esophagitis: Secondary | ICD-10-CM | POA: Diagnosis present

## 2021-12-26 DIAGNOSIS — R0602 Shortness of breath: Secondary | ICD-10-CM | POA: Diagnosis not present

## 2021-12-26 DIAGNOSIS — M549 Dorsalgia, unspecified: Secondary | ICD-10-CM

## 2021-12-26 DIAGNOSIS — Z9114 Patient's other noncompliance with medication regimen: Secondary | ICD-10-CM

## 2021-12-26 DIAGNOSIS — Z79899 Other long term (current) drug therapy: Secondary | ICD-10-CM

## 2021-12-26 DIAGNOSIS — L739 Follicular disorder, unspecified: Secondary | ICD-10-CM | POA: Diagnosis present

## 2021-12-26 DIAGNOSIS — R9389 Abnormal findings on diagnostic imaging of other specified body structures: Secondary | ICD-10-CM | POA: Insufficient documentation

## 2021-12-26 DIAGNOSIS — K2970 Gastritis, unspecified, without bleeding: Secondary | ICD-10-CM | POA: Diagnosis present

## 2021-12-26 DIAGNOSIS — R600 Localized edema: Secondary | ICD-10-CM | POA: Insufficient documentation

## 2021-12-26 DIAGNOSIS — F17210 Nicotine dependence, cigarettes, uncomplicated: Secondary | ICD-10-CM | POA: Diagnosis present

## 2021-12-26 DIAGNOSIS — M7989 Other specified soft tissue disorders: Secondary | ICD-10-CM

## 2021-12-26 DIAGNOSIS — Z20822 Contact with and (suspected) exposure to covid-19: Secondary | ICD-10-CM | POA: Diagnosis present

## 2021-12-26 DIAGNOSIS — I2510 Atherosclerotic heart disease of native coronary artery without angina pectoris: Secondary | ICD-10-CM | POA: Diagnosis present

## 2021-12-26 LAB — CBC WITH DIFFERENTIAL/PLATELET
Abs Immature Granulocytes: 0.01 10*3/uL (ref 0.00–0.07)
Basophils Absolute: 0 10*3/uL (ref 0.0–0.1)
Basophils Relative: 1 %
Eosinophils Absolute: 0 10*3/uL (ref 0.0–0.5)
Eosinophils Relative: 0 %
HCT: 46.8 % (ref 39.0–52.0)
Hemoglobin: 14.2 g/dL (ref 13.0–17.0)
Immature Granulocytes: 0 %
Lymphocytes Relative: 15 %
Lymphs Abs: 0.8 10*3/uL (ref 0.7–4.0)
MCH: 31.3 pg (ref 26.0–34.0)
MCHC: 30.3 g/dL (ref 30.0–36.0)
MCV: 103.3 fL — ABNORMAL HIGH (ref 80.0–100.0)
Monocytes Absolute: 0.5 10*3/uL (ref 0.1–1.0)
Monocytes Relative: 8 %
Neutro Abs: 4.3 10*3/uL (ref 1.7–7.7)
Neutrophils Relative %: 76 %
Platelets: 190 10*3/uL (ref 150–400)
RBC: 4.53 MIL/uL (ref 4.22–5.81)
RDW: 15.2 % (ref 11.5–15.5)
WBC: 5.6 10*3/uL (ref 4.0–10.5)
nRBC: 0 % (ref 0.0–0.2)

## 2021-12-26 LAB — RESP PANEL BY RT-PCR (FLU A&B, COVID) ARPGX2
Influenza A by PCR: NEGATIVE
Influenza B by PCR: NEGATIVE
SARS Coronavirus 2 by RT PCR: NEGATIVE

## 2021-12-26 LAB — COMPREHENSIVE METABOLIC PANEL
ALT: 15 U/L (ref 0–44)
AST: 28 U/L (ref 15–41)
Albumin: 3.3 g/dL — ABNORMAL LOW (ref 3.5–5.0)
Alkaline Phosphatase: 67 U/L (ref 38–126)
Anion gap: 9 (ref 5–15)
BUN: 18 mg/dL (ref 6–20)
CO2: 29 mmol/L (ref 22–32)
Calcium: 8.1 mg/dL — ABNORMAL LOW (ref 8.9–10.3)
Chloride: 103 mmol/L (ref 98–111)
Creatinine, Ser: 1.03 mg/dL (ref 0.61–1.24)
GFR, Estimated: >60 mL/min (ref >60)
Glucose, Bld: 83 mg/dL (ref 70–99)
Potassium: 5 mmol/L (ref 3.5–5.1)
Sodium: 141 mmol/L (ref 135–145)
Total Bilirubin: 0.5 mg/dL (ref 0.3–1.2)
Total Protein: 6.4 g/dL — ABNORMAL LOW (ref 6.5–8.1)

## 2021-12-26 LAB — BRAIN NATRIURETIC PEPTIDE: B Natriuretic Peptide: 1868.7 pg/mL — ABNORMAL HIGH (ref 0.0–100.0)

## 2021-12-26 LAB — TROPONIN I (HIGH SENSITIVITY)
Troponin I (High Sensitivity): 20 ng/L — ABNORMAL HIGH (ref <18)
Troponin I (High Sensitivity): 18 ng/L — ABNORMAL HIGH (ref <18)

## 2021-12-26 LAB — MAGNESIUM: Magnesium: 2 mg/dL (ref 1.7–2.4)

## 2021-12-26 MED ORDER — FAMOTIDINE 20 MG PO TABS: 20.0000 mg | ORAL_TABLET | Freq: Every day | ORAL | Status: DC

## 2021-12-26 MED ORDER — IPRATROPIUM-ALBUTEROL 0.5-2.5 (3) MG/3ML IN SOLN: 3.0000 mL | Freq: Four times a day (QID) | RESPIRATORY_TRACT | 3 refills | Status: DC | PRN

## 2021-12-26 MED ORDER — ALBUTEROL SULFATE (2.5 MG/3ML) 0.083% IN NEBU: 2.5000 mg | INHALATION_SOLUTION | Freq: Four times a day (QID) | RESPIRATORY_TRACT | Status: DC | PRN

## 2021-12-26 MED ORDER — HYDROCODONE-ACETAMINOPHEN 5-325 MG PO TABS
1.0000 | ORAL_TABLET | ORAL | Status: DC | PRN
  Administered 2021-12-27 – 2021-12-28 (×3): 1 via ORAL
  Filled 2021-12-26 (×3): qty 1

## 2021-12-26 MED ORDER — BREZTRI AEROSPHERE 160-9-4.8 MCG/ACT IN AERO: 2.0000 | INHALATION_SPRAY | Freq: Two times a day (BID) | RESPIRATORY_TRACT | 6 refills | Status: DC

## 2021-12-26 MED ORDER — SODIUM CHLORIDE 0.9% FLUSH: 3.0000 mL | INTRAVENOUS | Status: DC | PRN

## 2021-12-26 MED ORDER — IPRATROPIUM-ALBUTEROL 0.5-2.5 (3) MG/3ML IN SOLN
3.0000 mL | Freq: Once | RESPIRATORY_TRACT | Status: AC
  Administered 2021-12-26: 3 mL via RESPIRATORY_TRACT
  Filled 2021-12-26: qty 3

## 2021-12-26 MED ORDER — ACETAMINOPHEN 650 MG RE SUPP: 650.0000 mg | Freq: Four times a day (QID) | RECTAL | Status: DC | PRN

## 2021-12-26 MED ORDER — SODIUM CHLORIDE 0.9 % IV SOLN: 250.0000 mL | INTRAVENOUS | Status: DC | PRN

## 2021-12-26 MED ORDER — SODIUM CHLORIDE 0.9% FLUSH
3.0000 mL | Freq: Two times a day (BID) | INTRAVENOUS | Status: DC
  Administered 2021-12-26 – 2021-12-29 (×6): 3 mL via INTRAVENOUS

## 2021-12-26 MED ORDER — ACETAMINOPHEN 325 MG PO TABS: 650.0000 mg | ORAL_TABLET | Freq: Four times a day (QID) | ORAL | Status: DC | PRN

## 2021-12-26 MED ORDER — BUDESON-GLYCOPYRROL-FORMOTEROL 160-9-4.8 MCG/ACT IN AERO: 2.0000 | INHALATION_SPRAY | Freq: Two times a day (BID) | RESPIRATORY_TRACT | Status: DC

## 2021-12-26 MED ORDER — LOSARTAN POTASSIUM 25 MG PO TABS
25.0000 mg | ORAL_TABLET | Freq: Every day | ORAL | Status: DC
  Administered 2021-12-27 – 2021-12-29 (×3): 25 mg via ORAL
  Filled 2021-12-26 (×3): qty 1

## 2021-12-26 MED ORDER — FUROSEMIDE 10 MG/ML IJ SOLN
40.0000 mg | Freq: Once | INTRAMUSCULAR | Status: AC
Start: 2021-12-26 — End: 2021-12-26
  Administered 2021-12-26: 40 mg via INTRAVENOUS
  Filled 2021-12-26: qty 4

## 2021-12-26 MED ORDER — UMECLIDINIUM BROMIDE 62.5 MCG/ACT IN AEPB
1.0000 | INHALATION_SPRAY | Freq: Every day | RESPIRATORY_TRACT | Status: DC
  Administered 2021-12-28 – 2021-12-29 (×2): 1 via RESPIRATORY_TRACT
  Filled 2021-12-26: qty 7

## 2021-12-26 MED ORDER — IPRATROPIUM-ALBUTEROL 0.5-2.5 (3) MG/3ML IN SOLN: 3.0000 mL | Freq: Four times a day (QID) | RESPIRATORY_TRACT | Status: DC | PRN

## 2021-12-26 MED ORDER — IOHEXOL 350 MG/ML SOLN
100.0000 mL | Freq: Once | INTRAVENOUS | Status: AC | PRN
  Administered 2021-12-26: 100 mL via INTRAVENOUS

## 2021-12-26 MED ORDER — FUROSEMIDE 10 MG/ML IJ SOLN
40.0000 mg | Freq: Every day | INTRAMUSCULAR | Status: DC
  Administered 2021-12-27 – 2021-12-28 (×2): 40 mg via INTRAVENOUS
  Filled 2021-12-26 (×2): qty 4

## 2021-12-26 MED ORDER — DOXYCYCLINE HYCLATE 100 MG PO TABS
100.0000 mg | ORAL_TABLET | Freq: Two times a day (BID) | ORAL | Status: DC
  Administered 2021-12-26 – 2021-12-28 (×4): 100 mg via ORAL
  Filled 2021-12-26 (×4): qty 1

## 2021-12-26 MED ORDER — ALBUTEROL SULFATE HFA 108 (90 BASE) MCG/ACT IN AERS: 2.0000 | INHALATION_SPRAY | Freq: Four times a day (QID) | RESPIRATORY_TRACT | 3 refills | Status: DC | PRN

## 2021-12-26 MED ORDER — PANTOPRAZOLE SODIUM 40 MG PO TBEC
40.0000 mg | DELAYED_RELEASE_TABLET | Freq: Every day | ORAL | Status: DC
  Administered 2021-12-27 – 2021-12-29 (×3): 40 mg via ORAL
  Filled 2021-12-26 (×3): qty 1

## 2021-12-26 MED ORDER — FLUTICASONE FUROATE-VILANTEROL 200-25 MCG/ACT IN AEPB
1.0000 | INHALATION_SPRAY | Freq: Every day | RESPIRATORY_TRACT | Status: DC
  Administered 2021-12-28 – 2021-12-29 (×2): 1 via RESPIRATORY_TRACT
  Filled 2021-12-26: qty 28

## 2021-12-26 MED ORDER — ENOXAPARIN SODIUM 40 MG/0.4ML IJ SOSY
40.0000 mg | PREFILLED_SYRINGE | INTRAMUSCULAR | Status: DC
  Administered 2021-12-27 – 2021-12-28 (×2): 40 mg via SUBCUTANEOUS
  Filled 2021-12-26 (×2): qty 0.4

## 2021-12-26 NOTE — Assessment & Plan Note (Deleted)
61 year old with history of severe emphysema, diastolic dysfunction off echo in 10/22, untreated HTN presenting with worsening dyspnea, leg swelling and orthopnea.  -based off findings on clinical exam including 3+ pitting edema to above the knee, orthopnea and increased dyspnea on exertion, findings on CXR and elevated BNP confirms that patient is in acute on chronic diastolic CHF exacerbation -check echo. Last echo in 10/22 with EF of 60-65% with impaired relaxation. Also r/o ischemic cardiomyopathy  -strict I/O and daily weights. Dry weight is 135 pounds, currently at 166 pounds.  -IV diuresis. NOT on lasix at home, but started some leftover pills recently. Given 40mg  IV In ED and has already put out over 3L. Continue this daily, adjust as needed -check magnesium and follow electrolytes -telemetry  -does have 3rd spacing on CT, but has had great response to lasix. Once echo results, consult cards if needed.

## 2021-12-26 NOTE — ED Notes (Signed)
Patient transported to CT 

## 2021-12-26 NOTE — Assessment & Plan Note (Signed)
Seen on CT in 04/2019 and EGD done at that time Findings: Candida esophagitis (biopsied). -Mild gastritis -S/p empiric esophageal dilatation. Recommended protonix 40mg  daily, not on this -start back protonix daily

## 2021-12-26 NOTE — ED Provider Notes (Signed)
[facility] EMERGENCY DEPARTMENT Provider Note   CSN: [id number] Arrival date & time: 12/26/21  1028     History  Chief Complaint  Patient presents with   Shortness of Breath    Sean Hull is a 61 y.o. male.   Shortness of Breath  61 year old male with a history of COPD, follows with outpatient pulmonology, and from pulmonology clinic with concern for hypervolemia and respiratory failure.  Patient was at pulmonology clinic earlier today when it was noted that he had edema to his bilateral lower extremities in addition to his arms.  He endorses worsening shortness of breath.  Does have progressively worsened over the past 2 weeks during which she has had progressive swelling in his ankles and feet bilaterally.  He has had progressive worsening dyspnea on exertion with minimal activity.  He has positive orthopnea.  He has bilateral lower extremity edema in addition to arm edema.  His right arm appears to be more swollen than his left.  He also endorses swelling all over his torso and legs.  He continues to smoke.  He does have a history of substance abuse with cocaine but has not used in the past 4 years.  He denies any chest pain, nausea or vomiting.  He denies any fevers or chills.  He had a chest x-ray earlier today that showed no acute process.  He has been taking Lasix 20 mg daily but states that the swelling persists despite his outpatient Lasix use. His last echocardiogram revealed a normal EF between 60 and 65% with diastolic dysfunction, performed in September 2022.  Home Medications Prior to Admission medications   Medication Sig Start Date End Date Taking? Authorizing Provider  albuterol (VENTOLIN HFA) 108 (90 Base) MCG/ACT inhaler Inhale 2 puffs into the lungs every 6 (six) hours as needed for wheezing or shortness of breath. 12/26/21  Yes Parrett, Tammy S, NP  Aspirin-Acetaminophen-Caffeine (GOODY HEADACHE PO) Take 1 Package by mouth 2 (two) times daily as  needed (headache/pain).   Yes [provider]  Aspirin-Salicylamide-Caffeine (BC HEADACHE POWDER PO) Take 1 Package by mouth 2 (two) times daily as needed (headache/pain).   Yes [provider]  Budeson-Glycopyrrol-Formoterol (BREZTRI AEROSPHERE) 160-9-4.8 MCG/ACT AERO Inhale 2 puffs into the lungs in the morning and at bedtime. 12/26/21  Yes Parrett, Tammy S, NP  ipratropium-albuterol (DUONEB) 0.5-2.5 (3) MG/3ML SOLN Take 3 mLs by nebulization every 6 (six) hours as needed (shortness of breath or wheezing). 12/26/21  Yes Parrett, Tammy S, NP  PROAIR HFA 108 (90 Base) MCG/ACT inhaler Inhale 1-2 puffs into the lungs every 6 (six) hours as needed for wheezing or shortness of breath. 01/17/19  Yes [provider]  varenicline (CHANTIX PAK) 0.5 MG X 11 & 1 MG X 42 tablet Take one 0.5 mg tablet by mouth once daily for 3 days, then increase to one 0.5 mg tablet twice daily for 4 days, then increase to one 1 mg tablet twice daily. Patient not taking: Reported on 12/26/2021 08/22/21   Parrett, Virgel Bouquet, NP      Allergies    Patient has no known allergies.    Review of Systems   Review of Systems  Respiratory:  Positive for shortness of breath.   Cardiovascular:  Positive for leg swelling.  All other systems reviewed and are negative.  Physical Exam Updated Vital Signs BP (!) 168/99    Pulse 80    Temp 98.6 F (37 C) (Oral)    Resp 11  SpO2 98%  Physical Exam Vitals and nursing note reviewed.  Constitutional:      General: He is not in acute distress.    Appearance: He is well-developed.  HENT:     Head: Normocephalic and atraumatic.  Eyes:     Conjunctiva/sclera: Conjunctivae normal.     Pupils: Pupils are equal, round, and reactive to light.  Cardiovascular:     Rate and Rhythm: Normal rate and regular rhythm.     Heart sounds: No murmur heard. Pulmonary:     Effort: Pulmonary effort is normal. No respiratory distress.     Breath sounds: Rales present.   Abdominal:     General: There is no distension.     Palpations: Abdomen is soft.     Tenderness: There is no abdominal tenderness. There is no guarding.  Musculoskeletal:        General: No swelling, deformity or signs of injury.     Cervical back: Neck supple.     Right lower leg: Edema present.     Left lower leg: Edema present.     Comments: 3+ pitting edema to the knee bilaterally.  Pitting edema noted of the right upper extremity greater than left, neurovascularly intact  Skin:    General: Skin is warm and dry.     Capillary Refill: Capillary refill takes less than 2 seconds.     Findings: No lesion or rash.  Neurological:     General: No focal deficit present.     Mental Status: He is alert. Mental status is at baseline.  Psychiatric:        Mood and Affect: Mood normal.    ED Results / Procedures / Treatments   Labs (all labs ordered are listed, but only abnormal results are displayed) Labs Reviewed  COMPREHENSIVE METABOLIC PANEL - Abnormal; Notable for the following components:      Result Value   Calcium 8.1 (*)    Total Protein 6.4 (*)    Albumin 3.3 (*)    All other components within normal limits  CBC WITH DIFFERENTIAL/PLATELET - Abnormal; Notable for the following components:   MCV 103.3 (*)    All other components within normal limits  BRAIN NATRIURETIC PEPTIDE - Abnormal; Notable for the following components:   B Natriuretic Peptide 1,868.7 (*)    All other components within normal limits  TROPONIN I (HIGH SENSITIVITY) - Abnormal; Notable for the following components:   Troponin I (High Sensitivity) 20 (*)    All other components within normal limits  TROPONIN I (HIGH SENSITIVITY) - Abnormal; Notable for the following components:   Troponin I (High Sensitivity) 18 (*)    All other components within normal limits  RESP PANEL BY RT-PCR (FLU A&B, COVID) ARPGX2  MAGNESIUM  URINALYSIS, ROUTINE W REFLEX MICROSCOPIC  RAPID URINE DRUG SCREEN, HOSP PERFORMED   HIV ANTIBODY (ROUTINE TESTING W REFLEX)  TSH  BASIC METABOLIC PANEL  CBC    EKG EKG Interpretation  Date/Time:  Monday December 26 2021 10:37:34 EST Ventricular Rate:  89 PR Interval:  162 QRS Duration: 106 QT Interval:  324 QTC Calculation: 394 R Axis:   115 Text Interpretation: Normal sinus rhythm Right axis deviation Pulmonary disease pattern Incomplete right bundle branch block Right ventricular hypertrophy ST & T wave abnormality, consider inferior ischemia Abnormal ECG  Baseline wander makes interpretation challenging Confirmed by Ernie AvenaLawsing, Takashi ([id number]) on 12/26/2021 2:34:43 PM  Radiology DG Chest 2 View  Result Date: 12/26/2021 CLINICAL DATA:  Shortness of breath.  Fluid buildup. EXAM: CHEST - 2 VIEW COMPARISON:  12/26/2021 and 10/13/2019 chest radiographs. 08/13/2021 chest CT. FINDINGS: Cardiac silhouette and mediastinal contours are within normal limits. There is mild increased interstitial thickening predominantly within the lung bases. Increased lucencies are again seen consistent with the centrilobular emphysematous changes seen on prior chest CT. No definite pleural effusion. Flattening of the diaphragms with moderate hyperinflation, unchanged. No pneumothorax. Mild multilevel degenerative disc changes of the thoracic spine. IMPRESSION: Chronic hyperinflation. Possible mild bilateral basilar interstitial thickening minimal edema versus scarring. Electronically Signed   By: Neita Garnetonald  Viola M.D.   On: 12/26/2021 11:51   DG Chest 2 View  Result Date: 12/26/2021 CLINICAL DATA:  Pneumonia. EXAM: CHEST - 2 VIEW COMPARISON:  Chest radiograph and CT chest 08/13/2021. FINDINGS: Trachea is midline. Heart size stable. No dense airspace consolidation. Lungs are hyperinflated. No pleural fluid. IMPRESSION: Hyperinflation without acute finding. Electronically Signed   By: Leanna BattlesMelinda  Blietz M.D.   On: 12/26/2021 09:30   CT Angio Chest PE W and/or Wo Contrast  Result Date: 12/26/2021 CLINICAL  DATA:  Shortness of breath.  Pitting edema. EXAM: CT ANGIOGRAPHY CHEST WITH CONTRAST TECHNIQUE: Multidetector CT imaging of the chest was performed using the standard protocol during bolus administration of intravenous contrast. Multiplanar CT image reconstructions and MIPs were obtained to evaluate the vascular anatomy. RADIATION DOSE REDUCTION: This exam was performed according to the departmental dose-optimization program which includes automated exposure control, adjustment of the mA and/or kV according to patient size and/or use of iterative reconstruction technique. CONTRAST:  100mL OMNIPAQUE IOHEXOL 350 MG/ML SOLN COMPARISON:  Multiple exams, including chest radiograph 12/26/2021 and CTA chest from 08/13/2021 FINDINGS: Cardiovascular: No filling defect is identified in the pulmonary arterial tree to suggest pulmonary embolus. Coronary, aortic arch, and branch vessel atherosclerotic vascular disease. Heart size within normal limits. Mediastinum/Nodes: Mild diffuse wall thickening of the esophagus. Left paratracheal node 1.1 cm in short axis, image 68 series 5 (formerly 0.8 cm). Right hilar node 1.1 cm in short axis, image 85 series 5 (stable). Clustered AP window lymph nodes similar to prior. Lungs/Pleura: Biapical pleuroparenchymal scarring. Prominent emphysema. Improved aeration in the right middle lobe although a small amount of airspace opacity persist for example on image 143 series 7. 8 by 7 mm right lower lobe pulmonary nodule on image 125 series 7, stable from 07/08/2019 slightly decreased in size from 01/25/2019. Some improvement in the adjacent branching nodularity in the left lower lobe previously shown. Upper Abdomen: Upper abdominal ascites and mesenteric edema. Substantial subcutaneous edema along the upper abdomen, increased from 08/13/2021. Abdominal aortic atherosclerosis. Musculoskeletal: Left clavicular deformity from an old healed fracture. Review of the MIP images confirms the above  findings. IMPRESSION: 1. No filling defect is identified in the pulmonary arterial tree to suggest pulmonary embolus. 2. Prominent emphysema.  Emphysema (ICD10-J43.9). 3. Upper abdominal ascites with increased mesenteric and subcutaneous edema compatible with third spacing of fluid. 4. Improved airspace opacities in the right middle lobe and right lower lobe. 5. An 8 by 7 mm right lower lobe pulmonary nodule is stable from 07/08/2019 and slightly decreased in size from 01/25/2019. This favors a benign etiology. 6. Mildly prominent mediastinal and right hilar lymph nodes, mostly similar to previous although a left paratracheal lymph node has mildly enlarged. These may well be congested or reactive. 7. Mild diffuse wall thickening in the esophagus. Esophagitis be a common cause. 8.  Aortic Atherosclerosis (ICD10-I70.0).  Coronary atherosclerosis. Electronically Signed   By: Annitta NeedsWalter  Liebkemann M.D.  On: 12/26/2021 15:58   UE Venous Duplex ([facility] and [facility] ONLY)  Result Date: 12/26/2021 UPPER VENOUS STUDY  Patient Name:  OSVALDO LAMPING  Date of Exam:   12/26/2021 Medical Rec #: [id number]       Accession #:    [id number] Date of Birth: 02/16/1961       Patient Gender: M Patient Age:   38 years Exam Location:  [facility] Procedure:      VAS [place] UPPER EXTREMITY VENOUS DUPLEX Referring Phys: Ernie Avena --------------------------------------------------------------------------------  Indications: Swelling Risk Factors: None identified. Comparison Study: No prior studies. Performing Technologist: Chanda Busing RVT  Examination Guidelines: A complete evaluation includes B-mode imaging, spectral Doppler, color Doppler, and power Doppler as needed of all accessible portions of each vessel. Bilateral testing is considered an integral part of a complete examination. Limited examinations for reoccurring indications may be performed as noted.  Right Findings:  +----------+------------+---------+-----------+----------+-------+  RIGHT      Compressible Phasicity Spontaneous Properties Summary  +----------+------------+---------+-----------+----------+-------+  IJV            Full        Yes        Yes                         +----------+------------+---------+-----------+----------+-------+  Subclavian     Full        Yes        Yes                         +----------+------------+---------+-----------+----------+-------+  Axillary       Full        Yes        Yes                         +----------+------------+---------+-----------+----------+-------+  Brachial       Full        Yes        Yes                         +----------+------------+---------+-----------+----------+-------+  Radial         Full                                               +----------+------------+---------+-----------+----------+-------+  Ulnar          Full                                               +----------+------------+---------+-----------+----------+-------+  Cephalic       Full                                               +----------+------------+---------+-----------+----------+-------+  Basilic        Full                                               +----------+------------+---------+-----------+----------+-------+  Left Findings: +----------+------------+---------+-----------+----------+-------+  LEFT       Compressible Phasicity Spontaneous Properties Summary  +----------+------------+---------+-----------+----------+-------+  Subclavian     Full        Yes        Yes                         +----------+------------+---------+-----------+----------+-------+  Summary:  Right: No evidence of deep vein thrombosis in the upper extremity. No evidence of superficial vein thrombosis in the upper extremity.  Left: No evidence of thrombosis in the subclavian.  *See table(s) above for measurements and observations.     Preliminary     Procedures Procedures    Medications Ordered  in ED Medications  albuterol (PROVENTIL) (2.5 MG/3ML) 0.083% nebulizer solution 2.5 mg (has no administration in time range)  Budeson-Glycopyrrol-Formoterol 160-9-4.8 MCG/ACT AERO 2 puff (has no administration in time range)  ipratropium-albuterol (DUONEB) 0.5-2.5 (3) MG/3ML nebulizer solution 3 mL (has no administration in time range)  furosemide (LASIX) injection 40 mg (has no administration in time range)  losartan (COZAAR) tablet 25 mg (has no administration in time range)  enoxaparin (LOVENOX) injection 40 mg (has no administration in time range)  sodium chloride flush (NS) 0.9 % injection 3 mL (has no administration in time range)  sodium chloride flush (NS) 0.9 % injection 3 mL (has no administration in time range)  0.9 %  sodium chloride infusion (has no administration in time range)  acetaminophen (TYLENOL) tablet 650 mg (has no administration in time range)    Or  acetaminophen (TYLENOL) suppository 650 mg (has no administration in time range)  HYDROcodone-acetaminophen (NORCO/VICODIN) 5-325 MG per tablet 1 tablet (has no administration in time range)  doxycycline (VIBRA-TABS) tablet 100 mg (has no administration in time range)  pantoprazole (PROTONIX) EC tablet 40 mg (has no administration in time range)  furosemide (LASIX) injection 40 mg (40 mg Intravenous Given 12/26/21 1407)  ipratropium-albuterol (DUONEB) 0.5-2.5 (3) MG/3ML nebulizer solution 3 mL (3 mLs Nebulization Given 12/26/21 1452)  iohexol (OMNIPAQUE) 350 MG/ML injection 100 mL (100 mLs Intravenous Contrast Given 12/26/21 1543)    ED Course/ Medical Decision Making/ A&P                           Medical Decision Making Amount and/or Complexity of Data Reviewed Radiology: ordered. ECG/medicine tests: ordered.  Risk Prescription drug management. Decision regarding hospitalization.   61 year old male with a history of COPD, follows with outpatient pulmonology, and from pulmonology clinic with concern for  hypervolemia and respiratory failure.  Patient was at pulmonology clinic earlier today when it was noted that he had edema to his bilateral lower extremities in addition to his arms.  He endorses worsening shortness of breath.  Does have progressively worsened over the past 2 weeks during which she has had progressive swelling in his ankles and feet bilaterally.  He has had progressive worsening dyspnea on exertion with minimal activity.  He has positive orthopnea.  He has bilateral lower extremity edema in addition to arm edema.  His right arm appears to be more swollen than his left.  He also endorses swelling all over his torso and legs.  He continues to smoke.  He does have a history of substance abuse with cocaine but has not used in the past 4 years.  He denies any chest pain, nausea or vomiting.  He denies any fevers or chills.  He had a chest  x-ray earlier today that showed no acute process.  He has been taking Lasix 20 mg daily but states that the swelling persists despite his outpatient Lasix use. His last echocardiogram revealed a normal EF between 60 and 65% with diastolic dysfunction, performed in September 2022.  Additional history is obtained by review of pulmonology clinic notes.  On arrival, the patient was dyspneic, afebrile, hemodynamically stable, hypertensive BP 178/107, saturating 93% on room air.  The patient presents with signs clinically of hypervolemia.  Presents with positive dyspnea on exertion and orthopnea.  He has 3+ bilateral pitting lower extremity edema with additional edema in the upper extremities, right greater than left.  Differential diagnosis includes CHF, ACS, flash pulmonary edema, PE, DVT.   Patient does have a history of COPD but does not appear to be in acute exacerbation at this time, majority of his symptoms appear to be due to volume overload.  IV access was obtained and the patient was administered 40 mg of IV Lasix in addition to a DuoNeb nebulizer treatment.   Laboratory work-up initiated revealed no leukocytosis or anemia, BNP elevated to 1869, troponins mildly elevated but downtrending from 20-18, CMP generally unremarkable.  Right upper extremity DVT study was performed and resulted negative for DVT.  A CTA PE study was performed which revealed no evidence of acute pulmonary embolism.  Additional findings as below:   IMPRESSION: 1. No filling defect is identified in the pulmonary arterial tree to suggest pulmonary embolus. 2. Prominent emphysema.  Emphysema (ICD10-J43.9). 3. Upper abdominal ascites with increased mesenteric and subcutaneous edema compatible with third spacing of fluid. 4. Improved airspace opacities in the right middle lobe and right lower lobe. 5. An 8 by 7 mm right lower lobe pulmonary nodule is stable from 07/08/2019 and slightly decreased in size from 01/25/2019. This favors a benign etiology. 6. Mildly prominent mediastinal and right hilar lymph nodes, mostly similar to previous although a left paratracheal lymph node has mildly enlarged. These may well be congested or reactive. 7. Mild diffuse wall thickening in the esophagus. Esophagitis be a common cause. 8.  Aortic Atherosclerosis (ICD10-I70.0).  Coronary atherosclerosis.   Findings are concerning for CHF with significant hypervolemia.  The patient remains clinically stable for floor admission with persistent signs of hypervolemia.  His oxygen saturation remained 98% on 2 L O2 via nasal cannula.  He did not require any escalation in care.  He has a downtrending troponin and denies any chest pain at this time with low concern for ACS.  Overall findings are concerning for CHF and hypervolemia.  Dr. Artis Flock of hospitalist medicine was consulted for admission and accepted the patient in admission.   Final Clinical Impression(s) / ED Diagnoses Final diagnoses:  Acute congestive heart failure, unspecified heart failure type (HCC)  Hypervolemia, unspecified hypervolemia type   Acute respiratory failure with hypoxia [facility])    Rx / DC Orders ED Discharge Orders     None         Ernie Avena, MD 12/26/21 1942

## 2021-12-26 NOTE — Assessment & Plan Note (Addendum)
Acute flare with suspected volume overload.  Patient has diffuse swelling all over.  Chest x-ray shows no acute process.  Suspect this may be cardiac in nature. Patient has significant respiratory distress on exam along with suspected acute congestive heart failure. Patient will need further evaluation.  Patient will be transported to the emergency room via EMS and consideration for hospitalization Report given to EMS , transport to [facility] ER.   Plan  Patient Instructions  EMS transport to emergency room.Marland Kitchen

## 2021-12-26 NOTE — Assessment & Plan Note (Signed)
Smoking cessation discussed 

## 2021-12-26 NOTE — H&P (Signed)
History and Physical    OAKEN BERRETTA [id number] DOB: 10-01-1961 DOA: 12/26/2021  PCP: Patient, No Pcp Per (Inactive) Consultants:  pulm: Dr. Lamonte Sakai,  Patient coming from:  Home - lives alone   Chief Complaint: shortness of breath, swelling  HPI: Sean Hull is a 61 y.o. male with medical history significant of severe COPD, HTN, hx of substance abuse who presents to ED with complaints of worsening LE edema, shorntess of breath and orthopnea. He states he started to have swelling in his lower legs about 2 weeks ago and it has progressed to up his legs/stomach. He feels like he has not put out any urine and it's hard to urinate. He had some leftover lasix at 20mg /day that he started about 1 week ago and it didn't do anything. He increased to 1.5 tablets which helped for about 2 days only. He also has worsening orthopnea, but this has been going on for a while. He has chronic dyspnea on exertion, but this has also gotten worse from his baseline.    Baseline weight 135 pounds,  currently at 166 pounds.   He denies any recent fever/chills or viral illness. He denies any vision changes or headaches, chest pain or palpitations, denies cough, has tightness at times, no wheezing, no abdominal pain, no N/V/D, no pain in calf. Does endorse dysuria and urgency.   +smoker 1PDD, no alcohol or drugs. Remote hx of cocaine.   ED Course: vitals: afebrile, bp: 150/100, HR: 89, RR: 18, oxygen 92%RA With walking>86% Pertinent labs: BNP: 1868.7, troponin 20>18,  CXR: possible mild bilateral basilar interstitial thickening minimal edema vs. Scarring.  CTA chest: no filling defect. Prominent emphysema. Upper abdominal ascites compatible with 3rd spacing of fluids, stable pulmonary nodule, thickening in esophagus, prominent mediastinal lymph nodes In ED: patient given lasix 40mg , duoneb and TRH was asked to admit  Review of Systems: As per HPI; otherwise review of systems reviewed and negative.   Ambulatory  Status:  Ambulates without assistance   Past Medical History:  Diagnosis Date   Acute respiratory failure with hypoxia (HCC)    Arthritis    hands   CAP (community acquired pneumonia)    COPD (chronic obstructive pulmonary disease) (HCC)    Dyspnea    with exertion   Pneumonia    Substance use disorder    Tobacco use     Past Surgical History:  Procedure Laterality Date   BACK SURGERY     Disectomy    CYST EXCISION     left side of face   ESOPHAGEAL DILATION     ROTATOR CUFF REPAIR Right     x2   UPPER GASTROINTESTINAL ENDOSCOPY     VIDEO BRONCHOSCOPY WITH ENDOBRONCHIAL ULTRASOUND N/A 02/12/2019   Procedure: VIDEO BRONCHOSCOPY WITH ENDOBRONCHIAL ULTRASOUND;  Surgeon: Collene Gobble, MD;  Location: [facility] OR;  Service: Thoracic;  Laterality: N/A;    Social History   Socioeconomic History   Marital status: Widowed    Spouse name: Not on file   Number of children: Not on file   Years of education: Not on file   Highest education level: Not on file  Occupational History   Not on file  Tobacco Use   Smoking status: Every Day    Packs/day: 1.50    Years: 40.00    Pack years: 60.00    Types: Cigarettes   Smokeless tobacco: Never   Tobacco comments:    Smoking 1/2 ppd as of 12/26/2021 hfb  Vaping Use  Vaping Use: Never used  Substance and Sexual Activity   Alcohol use: Yes    Alcohol/week: 14.0 standard drinks    Types: 14 Cans of beer per week    Comment: per week   Drug use: Not Currently    Types: "Crack" cocaine    Comment: crack - last time  1 month or so ago- it was days before hospitalization in [place], 01/25/2019.   Sexual activity: Not on file  Other Topics Concern   Not on file  Social History Narrative   Not on file   Social Determinants of Health   Financial Resource Strain: Not on file  Food Insecurity: Not on file  Transportation Needs: Not on file  Physical Activity: Not on file  Stress: Not on file  Social Connections: Not on file   Intimate Partner Violence: Not on file    No Known Allergies  Family History  Problem Relation Age of Onset   Colon cancer Neg Hx    Esophageal cancer Neg Hx    Colon polyps Neg Hx    Rectal cancer Neg Hx    Stomach cancer Neg Hx     Prior to Admission medications   Medication Sig Start Date End Date Taking? Authorizing Provider  albuterol (VENTOLIN HFA) 108 (90 Base) MCG/ACT inhaler Inhale 2 puffs into the lungs every 6 (six) hours as needed for wheezing or shortness of breath. 12/26/21  Yes Parrett, Tammy S, NP  Aspirin-Acetaminophen-Caffeine (GOODY HEADACHE PO) Take 1 Package by mouth 2 (two) times daily as needed (headache/pain).   Yes [provider]  Aspirin-Salicylamide-Caffeine (BC HEADACHE POWDER PO) Take 1 Package by mouth 2 (two) times daily as needed (headache/pain).   Yes [provider]  Budeson-Glycopyrrol-Formoterol (BREZTRI AEROSPHERE) 160-9-4.8 MCG/ACT AERO Inhale 2 puffs into the lungs in the morning and at bedtime. 12/26/21  Yes Parrett, Tammy S, NP  ipratropium-albuterol (DUONEB) 0.5-2.5 (3) MG/3ML SOLN Take 3 mLs by nebulization every 6 (six) hours as needed (shortness of breath or wheezing). 12/26/21  Yes Parrett, Tammy S, NP  PROAIR HFA 108 (90 Base) MCG/ACT inhaler Inhale 1-2 puffs into the lungs every 6 (six) hours as needed for wheezing or shortness of breath. 01/17/19  Yes [provider]  varenicline (CHANTIX PAK) 0.5 MG X 11 & 1 MG X 42 tablet Take one 0.5 mg tablet by mouth once daily for 3 days, then increase to one 0.5 mg tablet twice daily for 4 days, then increase to one 1 mg tablet twice daily. Patient not taking: Reported on 12/26/2021 08/22/21   Melvenia Needles, NP    Physical Exam: Vitals:   12/26/21 1500 12/26/21 1630 12/26/21 1636 12/26/21 1900  BP: (!) 168/99   (!) 164/92  Pulse: 82 89 80 (!) 115  Resp: 11   16  Temp:      TempSrc:      SpO2: 100% (!) 86% 98% 95%     General:  Appears calm and comfortable and is  in NAD Eyes:  PERRL, EOMI, normal lids, iris ENT:  hard of hearing, lips & tongue, mmm; appropriate dentition Neck:  no LAD, masses or thyromegaly; no carotid bruits, no jvd Cardiovascular:  RRR, no m/r/g. 3+ pitting edema to above the knee Respiratory:   decreased air movement throughout bilateral bases. No wheezing or rhonchi. Normal respiratory effort. Abdomen:  soft, NT, ND, NABS Back:   normal alignment, no CVAT Skin:  follicular rash over arms, legs, torso. Macular, scabbed and with hyperpigmentation.  Musculoskeletal:  grossly normal tone BUE/BLE, good ROM, no bony abnormality Lower extremity:   Limited foot exam with no ulcerations.  2+ distal pulses. Psychiatric:  grossly normal mood and affect, speech fluent and appropriate, AOx3 Neurologic:  CN 2-12 grossly intact, moves all extremities in coordinated fashion, sensation intact    Radiological Exams on Admission: Independently reviewed - see discussion in A/P where applicable  DG Chest 2 View  Result Date: 12/26/2021 CLINICAL DATA:  Shortness of breath.  Fluid buildup. EXAM: CHEST - 2 VIEW COMPARISON:  12/26/2021 and 10/13/2019 chest radiographs. 08/13/2021 chest CT. FINDINGS: Cardiac silhouette and mediastinal contours are within normal limits. There is mild increased interstitial thickening predominantly within the lung bases. Increased lucencies are again seen consistent with the centrilobular emphysematous changes seen on prior chest CT. No definite pleural effusion. Flattening of the diaphragms with moderate hyperinflation, unchanged. No pneumothorax. Mild multilevel degenerative disc changes of the thoracic spine. IMPRESSION: Chronic hyperinflation. Possible mild bilateral basilar interstitial thickening minimal edema versus scarring. Electronically Signed   By: Yvonne Kendall M.D.   On: 12/26/2021 11:51   DG Chest 2 View  Result Date: 12/26/2021 CLINICAL DATA:  Pneumonia. EXAM: CHEST - 2 VIEW COMPARISON:  Chest radiograph and  CT chest 08/13/2021. FINDINGS: Trachea is midline. Heart size stable. No dense airspace consolidation. Lungs are hyperinflated. No pleural fluid. IMPRESSION: Hyperinflation without acute finding. Electronically Signed   By: Lorin Picket M.D.   On: 12/26/2021 09:30   CT Angio Chest PE W and/or Wo Contrast  Result Date: 12/26/2021 CLINICAL DATA:  Shortness of breath.  Pitting edema. EXAM: CT ANGIOGRAPHY CHEST WITH CONTRAST TECHNIQUE: Multidetector CT imaging of the chest was performed using the standard protocol during bolus administration of intravenous contrast. Multiplanar CT image reconstructions and MIPs were obtained to evaluate the vascular anatomy. RADIATION DOSE REDUCTION: This exam was performed according to the departmental dose-optimization program which includes automated exposure control, adjustment of the mA and/or kV according to patient size and/or use of iterative reconstruction technique. CONTRAST:  166mL OMNIPAQUE IOHEXOL 350 MG/ML SOLN COMPARISON:  Multiple exams, including chest radiograph 12/26/2021 and CTA chest from 08/13/2021 FINDINGS: Cardiovascular: No filling defect is identified in the pulmonary arterial tree to suggest pulmonary embolus. Coronary, aortic arch, and branch vessel atherosclerotic vascular disease. Heart size within normal limits. Mediastinum/Nodes: Mild diffuse wall thickening of the esophagus. Left paratracheal node 1.1 cm in short axis, image 68 series 5 (formerly 0.8 cm). Right hilar node 1.1 cm in short axis, image 85 series 5 (stable). Clustered AP window lymph nodes similar to prior. Lungs/Pleura: Biapical pleuroparenchymal scarring. Prominent emphysema. Improved aeration in the right middle lobe although a small amount of airspace opacity persist for example on image 143 series 7. 8 by 7 mm right lower lobe pulmonary nodule on image 125 series 7, stable from 07/08/2019 slightly decreased in size from 01/25/2019. Some improvement in the adjacent branching  nodularity in the left lower lobe previously shown. Upper Abdomen: Upper abdominal ascites and mesenteric edema. Substantial subcutaneous edema along the upper abdomen, increased from 08/13/2021. Abdominal aortic atherosclerosis. Musculoskeletal: Left clavicular deformity from an old healed fracture. Review of the MIP images confirms the above findings. IMPRESSION: 1. No filling defect is identified in the pulmonary arterial tree to suggest pulmonary embolus. 2. Prominent emphysema.  Emphysema (ICD10-J43.9). 3. Upper abdominal ascites with increased mesenteric and subcutaneous edema compatible with third spacing of fluid. 4. Improved airspace opacities in the right middle lobe and right lower lobe. 5. An 8  by 7 mm right lower lobe pulmonary nodule is stable from 07/08/2019 and slightly decreased in size from 01/25/2019. This favors a benign etiology. 6. Mildly prominent mediastinal and right hilar lymph nodes, mostly similar to previous although a left paratracheal lymph node has mildly enlarged. These may well be congested or reactive. 7. Mild diffuse wall thickening in the esophagus. Esophagitis be a common cause. 8.  Aortic Atherosclerosis (ICD10-I70.0).  Coronary atherosclerosis. Electronically Signed   By: Van Clines M.D.   On: 12/26/2021 15:58   UE Venous Duplex ([facility] and [facility] ONLY)  Result Date: 12/26/2021 UPPER VENOUS STUDY  Patient Name:  TRINO SCISM  Date of Exam:   12/26/2021 Medical Rec #: [id number]       Accession #:    [id number] Date of Birth: 10-26-1961       Patient Gender: M Patient Age:   76 years Exam Location:  [facility] Procedure:      VAS [place] UPPER EXTREMITY VENOUS DUPLEX Referring Phys: Regan Lemming --------------------------------------------------------------------------------  Indications: Swelling Risk Factors: None identified. Comparison Study: No prior studies. Performing Technologist: Oliver Hum RVT  Examination Guidelines: A complete evaluation includes B-mode  imaging, spectral Doppler, color Doppler, and power Doppler as needed of all accessible portions of each vessel. Bilateral testing is considered an integral part of a complete examination. Limited examinations for reoccurring indications may be performed as noted.  Right Findings: +----------+------------+---------+-----------+----------+-------+  RIGHT      Compressible Phasicity Spontaneous Properties Summary  +----------+------------+---------+-----------+----------+-------+  IJV            Full        Yes        Yes                         +----------+------------+---------+-----------+----------+-------+  Subclavian     Full        Yes        Yes                         +----------+------------+---------+-----------+----------+-------+  Axillary       Full        Yes        Yes                         +----------+------------+---------+-----------+----------+-------+  Brachial       Full        Yes        Yes                         +----------+------------+---------+-----------+----------+-------+  Radial         Full                                               +----------+------------+---------+-----------+----------+-------+  Ulnar          Full                                               +----------+------------+---------+-----------+----------+-------+  Cephalic       Full                                               +----------+------------+---------+-----------+----------+-------+  Basilic        Full                                               +----------+------------+---------+-----------+----------+-------+  Left Findings: +----------+------------+---------+-----------+----------+-------+  LEFT       Compressible Phasicity Spontaneous Properties Summary  +----------+------------+---------+-----------+----------+-------+  Subclavian     Full        Yes        Yes                         +----------+------------+---------+-----------+----------+-------+  Summary:  Right: No evidence of deep vein  thrombosis in the upper extremity. No evidence of superficial vein thrombosis in the upper extremity.  Left: No evidence of thrombosis in the subclavian.  *See table(s) above for measurements and observations.     Preliminary     EKG: Independently reviewed.  NSR with rate 89, incomplete RBBB; nonspecific ST changes with no evidence of acute ischemia. RBBB new. Interpretation difficult. Will try for repeat in AM    Labs on Admission: I have personally reviewed the available labs and imaging studies at the time of the admission.  Pertinent labs:  BNP: 1868.7,  troponin 20>18,    Assessment/Plan * Acute exacerbation of CHF (congestive heart failure) ([place]) 61 year old with history of severe emphysema, diastolic dysfunction off echo in 10/22, untreated HTN presenting with worsening dyspnea, leg swelling and orthopnea.  -based off findings on clinical exam including 3+ pitting edema to above the knee, orthopnea and increased dyspnea on exertion, findings on CXR and elevated BNP confirms that patient is in acute on chronic diastolic CHF exacerbation -check echo. Last echo in 10/22 with EF of 60-65% with impaired relaxation. Also r/o ischemic cardiomyopathy  -strict I/O and daily weights. Dry weight is 135 pounds, currently at 166 pounds.  -IV diuresis. NOT on lasix at home, but started some leftover pills recently. Given 40mg  IV In ED and has already put out over 3L. Continue this daily, adjust as needed -check magnesium and follow electrolytes -telemetry  -does have 3rd spacing on CT, but has had great response to lasix. Once echo results, consult cards if needed.   Acute respiratory failure with hypoxia ([place])- (present on admission) No home oxygen requirement, does not check pulse ox at home  Acceptable saturation on room air at rest With ambulation he desatted down to 86% Secondary to CHF exacerbation in setting of severe COPD vs. Baseline of severe COPD disease. ?ambulatory test in  outpatient setting  Wean as tolerated  Abnormal CT of the chest- (present on admission) -prominent mediastinal lymph nodes: followed by pulm biopsied in past.stable.  -some new paratracheal lymph node enlargement, could be secondary to congestion/reactive -followed by pulm  F/u outpatient for paratracheal monitoring     Esophageal thickening Seen on CT in 04/2019 and EGD done at that time Findings: Candida esophagitis (biopsied). -Mild gastritis -S/p empiric esophageal dilatation. Recommended protonix 40mg  daily, not on this -start back protonix daily   Hypertension- (present on admission) Non compliant with medication. Has been given in past but has not taken.  States he typically is around 150/100, in line with what we have here Starting on losartan 25mg /daily. Titrate as needed Encouraged compliance    Folliculitis- (present on admission) Extensive folliculitis with hyperpigmentation Course of doxycycline x 7 days to  cover for mrsa   Centrilobular emphysema ([place])- (present on admission) Severe, no exacerbation at this time Continue home breztri scheduled Albuterol and duoneb prn   Tobacco abuse- (present on admission) Current everyday smoker, 1PPD 60 pack year history Followed by pulm with annual screening Declines nicotine patch  Edema of right arm [place] of RUE negative for DVT No erythema or tenderness to suggest infection Continue to monitor with diuresis        There is no height or weight on file to calculate BMI.   Level of care: Telemetry Cardiac DVT prophylaxis:  Lovenox  Code Status:  Full - confirmed with patient Family Communication: sister at bedside: Diane harris  Disposition Plan:  The patient is from: home  Anticipated d/c is to: home   Patient placed in observation as anticipate less than 2 midnight stay. Requires hospitalization for CHF exacerbation with acute hypoxic respiratory failure requiring oxygen and IV diuretics as well as testing.  Not safe to return home at this time.     Patient is currently: stable Consults called: none   Admission status:  observation    Orma Flaming MD [facility]   How to contact the [facility] Attending or Consulting provider [place] or covering provider during after hours [place], for this patient?  Check the care team in [facility] and look for a) attending/consulting [facility] provider listed and b) the [facility] team listed Log into [email or web address] and use [place] universal password to access. If you do not have the password, please contact the hospital operator. Locate the [facility] provider you are looking for under [facility] and page to a number that you can be directly reached. If you still have difficulty reaching the provider, please page the [facility] (Director on Call) for the Hospitalists listed on amion for assistance.   12/26/2021, 8:17 PM

## 2021-12-26 NOTE — Assessment & Plan Note (Signed)
[place] of RUE negative for DVT No erythema or tenderness to suggest infection Continue to monitor with diuresis

## 2021-12-26 NOTE — Assessment & Plan Note (Addendum)
Improved rash will discontinue antibiotic therapy.

## 2021-12-26 NOTE — Assessment & Plan Note (Addendum)
Smoking cessation counseling. Continue with nicotine replacement.

## 2021-12-26 NOTE — ED Provider Triage Note (Signed)
Emergency Medicine Provider Triage Evaluation Note  Sean Hull , a 61 y.o. male  was evaluated in triage.  Pt complains of shortness of breath and hypoxia. He was seen at his pulmonologist office earlier today and sent here for concern of heart failure.      Physical Exam  BP (!) 178/107    Pulse 91    Temp 98.5 F (36.9 C) (Oral)    Resp 18    SpO2 93%  Gen:   Awake, no distress   Resp:  Increased effort with audible rales with out use of stethoscope. MSK:   Moves extremities without difficulty  Other:  Scattered wounds across torso bilaterally.  Medical Decision Making  Medically screening exam initiated at 10:52 AM.  Appropriate orders placed.  [name] was informed that the remainder of the evaluation will be completed by another provider, this initial triage assessment does not replace that evaluation, and the importance of remaining in the ED until their evaluation is complete.  Note: Portions of this report may have been transcribed using voice recognition software. Every effort was made to ensure accuracy; however, inadvertent computerized transcription errors may be present    Lorin Glass, PA-C 12/26/21 1054

## 2021-12-26 NOTE — Patient Instructions (Signed)
EMS transport to emergency room.Marland Kitchen

## 2021-12-26 NOTE — ED Notes (Signed)
Pt O2 dropped down to 86 on room air. Pt placed on 2L [place]. Pt O2 now at 98.

## 2021-12-26 NOTE — ED Triage Notes (Signed)
Pt from [facility] for eval of sob x 2 weeks with pitting edema up to his waist. 92% SpO2 on 2L. Does not wear home O2. Recently started on Lasix but has not been taking because "it's not working."

## 2021-12-26 NOTE — Progress Notes (Signed)
@Patient  ID: Sean Hull, male    DOB: 1961/01/28, 61 y.o.   MRN: [id number]  Chief Complaint  Patient presents with   Follow-up    Referring provider: No ref. provider found  HPI: 61 year old male active smoker followed for severe COPD with emphysema and lung nodules.  Abnormal CT chest with pulmonary infiltrates and mediastinal adenopathy (previous EBUS for lymphadenopathy negative and resolved with treatment for pneumonia) Medical history significant for substance abuse, hypertension Participates in LDCT screening program   TEST/EVENTS :  CT chest July 21, 2021 stable nodule in the right lower lobe measuring 8 mm.  Small nodules in the right lower lobe are stable.  No pathologically enlarged lymph nodes.  12/26/2021 Acute OV : Dyspnea  Patient presents for an acute office visit.  He complains over the last 2 weeks he has had progressive swelling that initially started in his ankles and feet.  Patient says his breathing has progressively worsened with increased shortness of breath with minimal activity.  He says he is so short of breath now he can hardly do anything.  Has positive orthopnea.  Patient says his legs have become more swollen now he has swelling up into his stomach and arms.  His weight is up 36 pounds since last visit.  Patient says he is swollen all over. Patient works full-time for a [employer] resources [employer].  He says he is on been unable to work for the last 2 weeks.  Patient is accompanied by his sister today. Patient also has diffuse sores all over his body including arms torso and legs.  Questionable etiology.  Patient does continue to smoke.  Smoking cessation was discussed.  Patient does drink alcohol daily but says he has been unable to drink much over the last couple weeks only about 1 beer a day. He does have a history of substance abuse with cocaine but has not used in greater than 4 years.  Patient denies any hemoptysis, chest pain, orthopnea nausea vomiting  or diarrhea no calf pain. He has had no fever or body aches.  Patient says he is only had minimum cough.  No discolored mucus. Chest x-ray today in the office shows no acute process. Patient is not on oxygen.  O2 saturations were 92% today on room air. Patient is supposed to be on Breztri inhaler twice daily.  Patient complains he is recently been out of this.  He also is out of his albuterol and nebulizer. Patient has been taking Lasix 20 mg daily but says has not been helping with the swelling    No Known Allergies  Immunization History  Administered Date(s) Administered   Influenza,inj,Quad PF,6+ Mos 11/10/2019   Pneumococcal Polysaccharide-23 11/10/2019    Past Medical History:  Diagnosis Date   Acute respiratory failure with hypoxia ([place])    Arthritis    hands   CAP (community acquired pneumonia)    COPD (chronic obstructive pulmonary disease) ([place])    Dyspnea    with exertion   Pneumonia    Substance use disorder    Tobacco use     Tobacco History: Social History   Tobacco Use  Smoking Status Every Day   Packs/day: 1.50   Years: 40.00   Pack years: 60.00   Types: Cigarettes  Smokeless Tobacco Never  Tobacco Comments   Smoking 1/2 ppd as of 12/26/2021 hfb   Ready to quit: No Counseling given: Yes Tobacco comments: Smoking 1/2 ppd as of 12/26/2021 hfb   Outpatient Medications Prior to  Visit  Medication Sig Dispense Refill   furosemide (LASIX) 20 MG tablet Take 20 mg by mouth daily.     ibuprofen (ADVIL,MOTRIN) 400 MG tablet Take 400 mg by mouth 2 (two) times daily as needed.     PROAIR HFA 108 (90 Base) MCG/ACT inhaler Inhale 1-2 puffs into the lungs every 6 (six) hours as needed for wheezing or shortness of breath.  (Patient not taking: Reported on 12/26/2021)     varenicline (CHANTIX PAK) 0.5 MG X 11 & 1 MG X 42 tablet Take one 0.5 mg tablet by mouth once daily for 3 days, then increase to one 0.5 mg tablet twice daily for 4 days, then increase to one 1 mg  tablet twice daily. (Patient not taking: Reported on 12/26/2021) 53 tablet 0   albuterol (VENTOLIN HFA) 108 (90 Base) MCG/ACT inhaler Inhale 2 puffs into the lungs every 6 (six) hours as needed for wheezing or shortness of breath. (Patient not taking: Reported on 12/26/2021) 18 g 5   Budeson-Glycopyrrol-Formoterol (BREZTRI AEROSPHERE) 160-9-4.8 MCG/ACT AERO Inhale 2 puffs into the lungs in the morning and at bedtime. (Patient not taking: Reported on 12/26/2021) 5.9 g 0   Budeson-Glycopyrrol-Formoterol (BREZTRI AEROSPHERE) 160-9-4.8 MCG/ACT AERO Inhale 2 puffs into the lungs in the morning and at bedtime. (Patient not taking: Reported on 12/26/2021) 5.9 g 0   Budeson-Glycopyrrol-Formoterol (BREZTRI AEROSPHERE) 160-9-4.8 MCG/ACT AERO Inhale 2 puffs into the lungs in the morning and at bedtime. (Patient not taking: Reported on 12/26/2021) 10.7 g 6   ipratropium-albuterol (DUONEB) 0.5-2.5 (3) MG/3ML SOLN Take 3 mLs by nebulization every 6 (six) hours as needed (shortness of breath or wheezing).  (Patient not taking: Reported on 12/26/2021)     No facility-administered medications prior to visit.     Review of Systems:   Constitutional:   No  weight loss, night sweats,  Fevers, chills,  +fatigue, or  lassitude.  HEENT:   No headaches,  Difficulty swallowing,  Tooth/dental problems, or  Sore throat,                No sneezing, itching, ear ache, nasal congestion, post nasal drip,   CV:  No chest pain,  ++Orthopnea, PND,+++ swelling in lower extremities, anasarca, dizziness, palpitations, syncope.   GI  No heartburn, indigestion, abdominal pain, nausea, vomiting, diarrhea, change in bowel habits, loss of appetite, bloody stools.   Resp:  No excess mucus, no productive cough,  No non-productive cough,  No coughing up of blood.  No change in color of mucus.  No wheezing.  No chest wall deformity  Skin: no rash or lesions.  GU: no dysuria, change in color of urine, no urgency or frequency.  No flank pain,  no hematuria   MS:  No joint pain or swelling.  No decreased range of motion.  No back pain.    Physical Exam  BP (!) 150/100 (BP Location: Left Arm, Patient Position: Sitting, Cuff Size: Normal)    Pulse 89    Temp 97.9 F (36.6 C) (Oral)    Ht 5\' 11"  (1.803 m)    Wt 166 lb (75.3 kg)    SpO2 92%    BMI 23.15 kg/m   GEN: A/Ox3; pleasant , NAD, ill appearing    HEENT:  [place]/AT,   NOSE-clear, THROAT-clear, no lesions, no postnasal drip or exudate noted.  +thrush   NECK:  Supple w/ fair ROM; no JVD; normal carotid impulses w/o bruits; no thyromegaly or nodules palpated; no lymphadenopathy.  RESP  few rhonchi   +accessory muscle use, no dullness to percussion  CARD:  RRR, no m/r/g, 3+ peripheral edema, pulses intact, no cyanosis or clubbing. Diffuse swelling the entire legs bilaterally.  GI:   Soft & nt; nml bowel sounds; no organomegaly or masses detected.  Positive distention with edema questionable ascites.  Musco: Warm bil, upper extremities with positive edema  Neuro: alert, no focal deficits noted.    Skin: Warm, diffuse ulcerations along the arms torso and lower extremities, scattered ecchymosis    Lab Results:  CBC    Component Value Date/Time   WBC 7.9 02/10/2019 1138   RBC 4.36 02/10/2019 1138   HGB 13.8 02/10/2019 1138   HCT 44.1 02/10/2019 1138   PLT 343 02/10/2019 1138   MCV 101.1 (H) 02/10/2019 1138   MCH 31.7 02/10/2019 1138   MCHC 31.3 02/10/2019 1138   RDW 13.4 02/10/2019 1138    BMET    Component Value Date/Time   NA 139 02/10/2019 1138   K 4.7 02/10/2019 1138   CL 105 02/10/2019 1138   CO2 24 02/10/2019 1138   GLUCOSE 109 (H) 02/10/2019 1138   BUN 19 02/10/2019 1138   CREATININE 0.97 02/10/2019 1138   CALCIUM 9.1 02/10/2019 1138   GFRNONAA >60 02/10/2019 1138   GFRAA >60 02/10/2019 1138    BNP No results found for: BNP  ProBNP No results found for: PROBNP  Imaging: DG Chest 2 View  Result Date: 12/26/2021 CLINICAL DATA:   Pneumonia. EXAM: CHEST - 2 VIEW COMPARISON:  Chest radiograph and CT chest 08/13/2021. FINDINGS: Trachea is midline. Heart size stable. No dense airspace consolidation. Lungs are hyperinflated. No pleural fluid. IMPRESSION: Hyperinflation without acute finding. Electronically Signed   By: Lorin Picket M.D.   On: 12/26/2021 09:30      No flowsheet data found.  No results found for: NITRICOXIDE      Assessment & Plan:   COPD with acute exacerbation ([place]) Acute flare with suspected volume overload.  Patient has diffuse swelling all over.  Chest x-ray shows no acute process.  Suspect this may be cardiac in nature. Patient has significant respiratory distress on exam along with suspected acute congestive heart failure. Patient will need further evaluation.  Patient will be transported to the emergency room via EMS and consideration for hospitalization Report given to EMS , transport to [facility] ER.   Plan  Patient Instructions  EMS transport to emergency room..     Tobacco abuse Smoking cessation discussed     Rexene Edison, NP 12/26/2021

## 2021-12-26 NOTE — Assessment & Plan Note (Deleted)
No home oxygen requirement, does not check pulse ox at home  Acceptable saturation on room air at rest With ambulation he desatted down to 86% Secondary to CHF exacerbation in setting of severe COPD vs. Baseline of severe COPD disease. ?ambulatory test in outpatient setting  Wean as tolerated

## 2021-12-26 NOTE — Assessment & Plan Note (Addendum)
-  prominent mediastinal lymph nodes: followed by pulm biopsied in past.stable.  -some new paratracheal lymph node enlargement, could be secondary to congestion/reactive -followed by pulm  F/u outpatient for paratracheal monitoring

## 2021-12-26 NOTE — Assessment & Plan Note (Addendum)
CT chest with advance and diffuse emphysematous changes. No clinical signs of exacerbation.  Plan to discontinue steroids, continue with as needed bronchodilator therapy. Out bed to chair, PT and OT, nutrition consult.  Smoking cessation counseling.  Continue with inhaled corticosteroids and long acting Beta agonist. Continue long acting anticolinergic therapy    For acute hypoxemic respiratory failure, continue oxymetry monitoring and will likely will need home 02

## 2021-12-26 NOTE — Assessment & Plan Note (Addendum)
Blood pressure this am 161/92. Plan to continue blood pressure control with losartan.

## 2021-12-26 NOTE — Progress Notes (Signed)
Right upper extremity venous duplex has been completed. Preliminary results can be found in CV Proc through chart review.  Results were given to Dr. Karene Fry.  12/26/21 1:59 PM Olen Cordial RVT

## 2021-12-27 ENCOUNTER — Other Ambulatory Visit (HOSPITAL_COMMUNITY): Payer: Self-pay

## 2021-12-27 ENCOUNTER — Telehealth: Payer: Self-pay | Admitting: Pharmacy Technician

## 2021-12-27 ENCOUNTER — Observation Stay (HOSPITAL_BASED_OUTPATIENT_CLINIC_OR_DEPARTMENT_OTHER): Payer: No Typology Code available for payment source

## 2021-12-27 DIAGNOSIS — M545 Low back pain, unspecified: Secondary | ICD-10-CM | POA: Diagnosis not present

## 2021-12-27 DIAGNOSIS — I2510 Atherosclerotic heart disease of native coronary artery without angina pectoris: Secondary | ICD-10-CM | POA: Diagnosis present

## 2021-12-27 DIAGNOSIS — I110 Hypertensive heart disease with heart failure: Secondary | ICD-10-CM | POA: Diagnosis present

## 2021-12-27 DIAGNOSIS — R911 Solitary pulmonary nodule: Secondary | ICD-10-CM | POA: Diagnosis present

## 2021-12-27 DIAGNOSIS — R0602 Shortness of breath: Secondary | ICD-10-CM | POA: Diagnosis present

## 2021-12-27 DIAGNOSIS — J9601 Acute respiratory failure with hypoxia: Secondary | ICD-10-CM | POA: Diagnosis not present

## 2021-12-27 DIAGNOSIS — J432 Centrilobular emphysema: Secondary | ICD-10-CM | POA: Diagnosis present

## 2021-12-27 DIAGNOSIS — Z7951 Long term (current) use of inhaled steroids: Secondary | ICD-10-CM | POA: Diagnosis not present

## 2021-12-27 DIAGNOSIS — B3781 Candidal esophagitis: Secondary | ICD-10-CM | POA: Diagnosis present

## 2021-12-27 DIAGNOSIS — Z79899 Other long term (current) drug therapy: Secondary | ICD-10-CM | POA: Diagnosis not present

## 2021-12-27 DIAGNOSIS — J9621 Acute and chronic respiratory failure with hypoxia: Secondary | ICD-10-CM | POA: Diagnosis present

## 2021-12-27 DIAGNOSIS — K2970 Gastritis, unspecified, without bleeding: Secondary | ICD-10-CM | POA: Diagnosis present

## 2021-12-27 DIAGNOSIS — Z9114 Patient's other noncompliance with medication regimen: Secondary | ICD-10-CM | POA: Diagnosis not present

## 2021-12-27 DIAGNOSIS — Z23 Encounter for immunization: Secondary | ICD-10-CM | POA: Diagnosis not present

## 2021-12-27 DIAGNOSIS — M549 Dorsalgia, unspecified: Secondary | ICD-10-CM | POA: Diagnosis present

## 2021-12-27 DIAGNOSIS — Z20822 Contact with and (suspected) exposure to covid-19: Secondary | ICD-10-CM | POA: Diagnosis present

## 2021-12-27 DIAGNOSIS — I5033 Acute on chronic diastolic (congestive) heart failure: Secondary | ICD-10-CM | POA: Diagnosis present

## 2021-12-27 DIAGNOSIS — L739 Follicular disorder, unspecified: Secondary | ICD-10-CM | POA: Diagnosis present

## 2021-12-27 DIAGNOSIS — F17210 Nicotine dependence, cigarettes, uncomplicated: Secondary | ICD-10-CM | POA: Diagnosis present

## 2021-12-27 LAB — BASIC METABOLIC PANEL
Anion gap: 7 (ref 5–15)
BUN: 13 mg/dL (ref 6–20)
CO2: 32 mmol/L (ref 22–32)
Calcium: 7.7 mg/dL — ABNORMAL LOW (ref 8.9–10.3)
Chloride: 103 mmol/L (ref 98–111)
Creatinine, Ser: 0.87 mg/dL (ref 0.61–1.24)
GFR, Estimated: >60 mL/min (ref >60)
Glucose, Bld: 74 mg/dL (ref 70–99)
Potassium: 3.8 mmol/L (ref 3.5–5.1)
Sodium: 142 mmol/L (ref 135–145)

## 2021-12-27 LAB — URINALYSIS, ROUTINE W REFLEX MICROSCOPIC
Bilirubin Urine: NEGATIVE
Glucose, UA: NEGATIVE mg/dL
Hgb urine dipstick: NEGATIVE
Ketones, ur: NEGATIVE mg/dL
Leukocytes,Ua: NEGATIVE
Nitrite: NEGATIVE
Protein, ur: NEGATIVE mg/dL
Specific Gravity, Urine: 1.028 (ref 1.005–1.030)
pH: 5 (ref 5.0–8.0)

## 2021-12-27 LAB — RAPID URINE DRUG SCREEN, HOSP PERFORMED
Amphetamines: NOT DETECTED
Barbiturates: NOT DETECTED
Benzodiazepines: NOT DETECTED
Cocaine: NOT DETECTED
Opiates: NOT DETECTED
Tetrahydrocannabinol: NOT DETECTED

## 2021-12-27 LAB — CBC
HCT: 43.4 % (ref 39.0–52.0)
Hemoglobin: 13.4 g/dL (ref 13.0–17.0)
MCH: 30.7 pg (ref 26.0–34.0)
MCHC: 30.9 g/dL (ref 30.0–36.0)
MCV: 99.5 fL (ref 80.0–100.0)
Platelets: 173 10*3/uL (ref 150–400)
RBC: 4.36 MIL/uL (ref 4.22–5.81)
RDW: 15 % (ref 11.5–15.5)
WBC: 5.7 10*3/uL (ref 4.0–10.5)
nRBC: 0 % (ref 0.0–0.2)

## 2021-12-27 LAB — ECHOCARDIOGRAM COMPLETE
Area-P 1/2: 4.31 cm2
Height: 71 in
S' Lateral: 3.4 cm
Weight: 2656 oz

## 2021-12-27 MED ORDER — METHYLPREDNISOLONE SODIUM SUCC 125 MG IJ SOLR
80.0000 mg | INTRAMUSCULAR | Status: DC
  Administered 2021-12-27 – 2021-12-28 (×2): 80 mg via INTRAVENOUS
  Filled 2021-12-27 (×2): qty 2

## 2021-12-27 MED ORDER — ALBUTEROL SULFATE (2.5 MG/3ML) 0.083% IN NEBU: 2.5000 mg | INHALATION_SOLUTION | RESPIRATORY_TRACT | Status: DC | PRN

## 2021-12-27 MED ORDER — IPRATROPIUM-ALBUTEROL 0.5-2.5 (3) MG/3ML IN SOLN
3.0000 mL | RESPIRATORY_TRACT | Status: DC
  Administered 2021-12-27: 11:00:00 3 mL via RESPIRATORY_TRACT
  Filled 2021-12-27 (×2): qty 3

## 2021-12-27 MED ORDER — IPRATROPIUM-ALBUTEROL 0.5-2.5 (3) MG/3ML IN SOLN: 3.0000 mL | RESPIRATORY_TRACT | Status: DC | PRN

## 2021-12-27 NOTE — ED Notes (Signed)
Echo at bedside

## 2021-12-27 NOTE — TOC Progression Note (Signed)
Transition of Care [facility]) - Progression Note    Patient Details  Name: Sean Hull MRN: [id number] Date of Birth: 1961-02-05  Transition of Care [facility] CM/SW Contact  Leone Haven, RN Phone Number: 12/27/2021, 2:37 PM  Clinical Narrative:     Transition of Care [facility]) Screening Note   Patient Details  Name: Sean Hull Date of Birth: [birth year]   Transition of Care [facility]) CM/SW Contact:    Leone Haven, RN Phone Number: 12/27/2021, 2:37 PM    Transition of Care Department [facility]) has reviewed patient and no TOC needs have been identified at this time. We will continue to monitor patient advancement through interdisciplinary progression rounds. If new patient transition needs arise, please place a TOC consult.          Expected Discharge Plan and Services                                                 Social Determinants of Health (SDOH) Interventions    Readmission Risk Interventions No flowsheet data found.

## 2021-12-27 NOTE — Telephone Encounter (Signed)
Patient Advocate Encounter   Received notification from [employer] that prior authorization for Ipratropium- Albuterol is required by his/her insurance [employer].  Per Test Claim: $10, no PA needed

## 2021-12-27 NOTE — ED Notes (Signed)
Sister Langley Gauss [phone] would like an update and to know if he slept

## 2021-12-27 NOTE — Progress Notes (Signed)
PROGRESS NOTE    Sean Hull  [id number] DOB: [birth year] DOA: 12/26/2021 PCP: Patient, No Pcp Per (Inactive)    Brief Narrative:  61 year old gentleman with history of severe COPD, hypertension, previous history of substance abuse comes to the emergency department with worsening lower extremity edema shortness of breath and orthopnea for about 2 weeks.  He is a still active smoker.  In the emergency room, blood pressure is stable.  92% on room air, with walking his saturations 86%.  BNP 1868.  Chest x-ray with bilateral interstitial thickening consistent with edema.  Admitted with shortness of breath.   Assessment & Plan:   Acute on chronic shortness of breath: Multifactorial.  Likely COPD exacerbation. also evidence of diastolic dysfunction.  COPD with acute exacerbation, acute hypoxemic respiratory failure: With significant symptoms. Agree with admission to monitored unit because of severity of symptoms. Aggressive bronchodilator therapy, IV steroids, inhalational steroids, scheduled and as needed bronchodilators, deep breathing exercises, incentive spirometry, chest physiotherapy. Empiric doxycycline for 7 days. Supplemental oxygen to keep saturations more than 92%.  Mobilize.  Acute exacerbation of diastolic heart failure: Clinical exam including pitting edema, orthopnea and worsening shortness of breath, elevated BNP.  Previous echocardiogram 10/22 with ejection fraction 60 to 65%. Echocardiogram today with ejection fraction 50%.  Not on any diuresis at home. Continue IV Lasix, already responding. Intake and output monitoring.  Urine output monitoring.  Electrolyte monitoring.  Recheck electrolytes including magnesium and phosphorus in the morning.  Essential hypertension: Blood pressures fairly stable on current medication including losartan.  Smoker: Counseled to quit.  Declined nicotine patch.   DVT prophylaxis: enoxaparin (LOVENOX) injection 40 mg Start: 12/26/21  1730   Code Status: Full code Family Communication: None.  Sister called, unable to pick up the phone. Disposition Plan: Status is: Observation  The patient will require care spanning > 2 midnights and should be moved to inpatient because: Significantly short of breath, requiring frequent nebulizer.  IV diuresis.        Consultants:  None  Procedures:  None  Antimicrobials:  Doxycycline 1/23---   Subjective: Patient seen and examined.  On my initial interview in the morning rounds, he was severely wheezing and was in moderate distress.  After nebulizer therapy he was able to talk.  He was still not able to talk in full sentences, however he was asking me to discharge him home. He told me that he does not have oxygen at home, later on he tells me he can use it as needed at home.  Difficult historian.  I tried to reach his sister, unable.  Objective: Vitals:   12/27/21 1115 12/27/21 1248 12/27/21 1330 12/27/21 1431  BP: (!) 173/86 (!) 156/81 138/90 (!) 161/84  Pulse: 81 81 (!) 105 91  Resp: 15 (!) 21 (!) 22 20  Temp:  97.8 F (36.6 C)  98.7 F (37.1 C)  TempSrc:  Oral  Oral  SpO2: 94% 95% 93% 99%  Weight:    66.8 kg  Height:    5\' 11"  (1.803 m)    Intake/Output Summary (Last 24 hours) at 12/27/2021 1443 Last data filed at 12/27/2021 1146 Gross per 24 hour  Intake 590 ml  Output 2425 ml  Net -1835 ml   Filed Weights   12/27/21 1431  Weight: 66.8 kg    Examination:  General exam: Appears in mild distress.  Anxious and restless. Respiratory system: Bilateral conducted airway sounds.  Currently on 2 L oxygen. Cardiovascular system: S1 & S2 heard, RRR.  Trace bilateral pedal edema.   Gastrointestinal system: Abdomen is nondistended, soft and nontender. No organomegaly or masses felt. Normal bowel sounds heard. Central nervous system: Alert and oriented. No focal neurological deficits. Extremities: Symmetric 5 x 5 power. Skin:  Patient has multiple skin rashes  without infection.    Data Reviewed: I have personally reviewed following labs and imaging studies  CBC: Recent Labs  Lab 12/26/21 1056 12/27/21 0330  WBC 5.6 5.7  NEUTROABS 4.3  --   HGB 14.2 13.4  HCT 46.8 43.4  MCV 103.3* 99.5  PLT 190 [id number]   Basic Metabolic Panel: Recent Labs  Lab 12/26/21 1056 12/27/21 0330  NA 141 142  K 5.0 3.8  CL 103 103  CO2 29 32  GLUCOSE 83 74  BUN 18 13  CREATININE 1.03 0.87  CALCIUM 8.1* 7.7*  MG 2.0  --    GFR: Estimated Creatinine Clearance: 86.4 mL/min (by C-G formula based on SCr of 0.87 mg/dL). Liver Function Tests: Recent Labs  Lab 12/26/21 1056  AST 28  ALT 15  ALKPHOS 67  BILITOT 0.5  PROT 6.4*  ALBUMIN 3.3*   No results for input(s): LIPASE, AMYLASE in the last 168 hours. No results for input(s): AMMONIA in the last 168 hours. Coagulation Profile: No results for input(s): INR, PROTIME in the last 168 hours. Cardiac Enzymes: No results for input(s): CKTOTAL, CKMB, CKMBINDEX, TROPONINI in the last 168 hours. BNP (last 3 results) No results for input(s): PROBNP in the last 8760 hours. HbA1C: No results for input(s): HGBA1C in the last 72 hours. CBG: No results for input(s): GLUCAP in the last 168 hours. Lipid Profile: No results for input(s): CHOL, HDL, LDLCALC, TRIG, CHOLHDL, LDLDIRECT in the last 72 hours. Thyroid Function Tests: No results for input(s): TSH, T4TOTAL, FREET4, T3FREE, THYROIDAB in the last 72 hours. Anemia Panel: No results for input(s): VITAMINB12, FOLATE, FERRITIN, TIBC, IRON, RETICCTPCT in the last 72 hours. Sepsis Labs: No results for input(s): PROCALCITON, LATICACIDVEN in the last 168 hours.  Recent Results (from the past 240 hour(s))  Resp Panel by RT-PCR (Flu A&B, Covid) Nasopharyngeal Swab     Status: None   Collection Time: 12/26/21 10:52 AM   Specimen: Nasopharyngeal Swab; Nasopharyngeal(NP) swabs in vial transport medium  Result Value Ref Range Status   SARS Coronavirus 2 by RT PCR  NEGATIVE NEGATIVE Final    Comment: (NOTE) SARS-CoV-2 target nucleic acids are NOT DETECTED.  The SARS-CoV-2 RNA is generally detectable in upper respiratory specimens during the acute phase of infection. The lowest concentration of SARS-CoV-2 viral copies this assay can detect is 138 copies/mL. A negative result does not preclude SARS-Cov-2 infection and should not be used as the sole basis for treatment or other patient management decisions. A negative result may occur with  improper specimen collection/handling, submission of specimen other than nasopharyngeal swab, presence of viral mutation(s) within the areas targeted by this assay, and inadequate number of viral copies(<138 copies/mL). A negative result must be combined with clinical observations, patient history, and epidemiological information. The expected result is Negative.  Fact Sheet for Patients:  EntrepreneurPulse.com.au  Fact Sheet for Healthcare Providers:  IncredibleEmployment.be  This test is no t yet approved or cleared by the [place] FDA and  has been authorized for detection and/or diagnosis of SARS-CoV-2 by FDA under an Emergency Use Authorization (EUA). This EUA will remain  in effect (meaning this test can be used) for the duration of the COVID-19 declaration under Section 564(b)(1) of the  Act, 21 U.S.C.section 360bbb-3(b)(1), unless the authorization is terminated  or revoked sooner.       Influenza A by PCR NEGATIVE NEGATIVE Final   Influenza B by PCR NEGATIVE NEGATIVE Final    Comment: (NOTE) The Xpert Xpress SARS-CoV-2/FLU/RSV plus assay is intended as an aid in the diagnosis of influenza from Nasopharyngeal swab specimens and should not be used as a sole basis for treatment. Nasal washings and aspirates are unacceptable for Xpert Xpress SARS-CoV-2/FLU/RSV testing.  Fact Sheet for Patients: EntrepreneurPulse.com.au  Fact Sheet for  Healthcare Providers: IncredibleEmployment.be  This test is not yet approved or cleared by the [place] FDA and has been authorized for detection and/or diagnosis of SARS-CoV-2 by FDA under an Emergency Use Authorization (EUA). This EUA will remain in effect (meaning this test can be used) for the duration of the COVID-19 declaration under Section 564(b)(1) of the Act, 21 U.S.C. section 360bbb-3(b)(1), unless the authorization is terminated or revoked.  Performed at [facility], [place] [address], [place], [place] [address]          Radiology Studies: DG Chest 2 View  Result Date: 12/26/2021 CLINICAL DATA:  Shortness of breath.  Fluid buildup. EXAM: CHEST - 2 VIEW COMPARISON:  12/26/2021 and 10/13/2019 chest radiographs. 08/13/2021 chest CT. FINDINGS: Cardiac silhouette and mediastinal contours are within normal limits. There is mild increased interstitial thickening predominantly within the lung bases. Increased lucencies are again seen consistent with the centrilobular emphysematous changes seen on prior chest CT. No definite pleural effusion. Flattening of the diaphragms with moderate hyperinflation, unchanged. No pneumothorax. Mild multilevel degenerative disc changes of the thoracic spine. IMPRESSION: Chronic hyperinflation. Possible mild bilateral basilar interstitial thickening minimal edema versus scarring. Electronically Signed   By: Yvonne Kendall M.D.   On: 12/26/2021 11:51   DG Chest 2 View  Result Date: 12/26/2021 CLINICAL DATA:  Pneumonia. EXAM: CHEST - 2 VIEW COMPARISON:  Chest radiograph and CT chest 08/13/2021. FINDINGS: Trachea is midline. Heart size stable. No dense airspace consolidation. Lungs are hyperinflated. No pleural fluid. IMPRESSION: Hyperinflation without acute finding. Electronically Signed   By: Lorin Picket M.D.   On: 12/26/2021 09:30   CT Angio Chest PE W and/or Wo Contrast  Result Date: 12/26/2021 CLINICAL DATA:  Shortness  of breath.  Pitting edema. EXAM: CT ANGIOGRAPHY CHEST WITH CONTRAST TECHNIQUE: Multidetector CT imaging of the chest was performed using the standard protocol during bolus administration of intravenous contrast. Multiplanar CT image reconstructions and MIPs were obtained to evaluate the vascular anatomy. RADIATION DOSE REDUCTION: This exam was performed according to the departmental dose-optimization program which includes automated exposure control, adjustment of the mA and/or kV according to patient size and/or use of iterative reconstruction technique. CONTRAST:  137mL OMNIPAQUE IOHEXOL 350 MG/ML SOLN COMPARISON:  Multiple exams, including chest radiograph 12/26/2021 and CTA chest from 08/13/2021 FINDINGS: Cardiovascular: No filling defect is identified in the pulmonary arterial tree to suggest pulmonary embolus. Coronary, aortic arch, and branch vessel atherosclerotic vascular disease. Heart size within normal limits. Mediastinum/Nodes: Mild diffuse wall thickening of the esophagus. Left paratracheal node 1.1 cm in short axis, image 68 series 5 (formerly 0.8 cm). Right hilar node 1.1 cm in short axis, image 85 series 5 (stable). Clustered AP window lymph nodes similar to prior. Lungs/Pleura: Biapical pleuroparenchymal scarring. Prominent emphysema. Improved aeration in the right middle lobe although a small amount of airspace opacity persist for example on image 143 series 7. 8 by 7 mm right lower lobe pulmonary nodule on  image 125 series 7, stable from 07/08/2019 slightly decreased in size from 01/25/2019. Some improvement in the adjacent branching nodularity in the left lower lobe previously shown. Upper Abdomen: Upper abdominal ascites and mesenteric edema. Substantial subcutaneous edema along the upper abdomen, increased from 08/13/2021. Abdominal aortic atherosclerosis. Musculoskeletal: Left clavicular deformity from an old healed fracture. Review of the MIP images confirms the above findings. IMPRESSION:  1. No filling defect is identified in the pulmonary arterial tree to suggest pulmonary embolus. 2. Prominent emphysema.  Emphysema (ICD10-J43.9). 3. Upper abdominal ascites with increased mesenteric and subcutaneous edema compatible with third spacing of fluid. 4. Improved airspace opacities in the right middle lobe and right lower lobe. 5. An 8 by 7 mm right lower lobe pulmonary nodule is stable from 07/08/2019 and slightly decreased in size from 01/25/2019. This favors a benign etiology. 6. Mildly prominent mediastinal and right hilar lymph nodes, mostly similar to previous although a left paratracheal lymph node has mildly enlarged. These may well be congested or reactive. 7. Mild diffuse wall thickening in the esophagus. Esophagitis be a common cause. 8.  Aortic Atherosclerosis (ICD10-I70.0).  Coronary atherosclerosis. Electronically Signed   By: Van Clines M.D.   On: 12/26/2021 15:58   ECHOCARDIOGRAM COMPLETE  Result Date: 12/27/2021    ECHOCARDIOGRAM REPORT   Patient Name:   Sean Hull Date of Exam: 12/27/2021 Medical Rec #:  [id number]      Height:       71.0 in Accession #:    [id number]     Weight:       166.0 lb Date of Birth:  Jan 01, 1961      BSA:          1.948 m Patient Age:    75 years       BP:           173/86 mmHg Patient Gender: M              HR:           79 bpm. Exam Location:  Inpatient Procedure: 2D Echo Indications:    congestive heart failure  History:        Patient has no prior history of Echocardiogram examinations.                 COPD, Signs/Symptoms:Shortness of Breath; Risk                 Factors:Hypertension and Current Smoker.  Sonographer:    Johny Chess RDCS Referring Phys: [id number] [place]  1. Left ventricular ejection fraction, by estimation, is 50%. The left ventricle has low normal function. The left ventricle has no regional wall motion abnormalities. Left ventricular diastolic parameters are consistent with Grade I diastolic dysfunction  (impaired relaxation).  2. Right ventricular systolic function is normal. The right ventricular size is dilated. Tricuspid regurgitation signal is inadequate for assessing PA pressure.  3. The mitral valve is normal in structure. Trivial mitral valve regurgitation. No evidence of mitral stenosis.  4. The aortic valve was not well visualized. Aortic valve regurgitation is not visualized.  5. The inferior vena cava is dilated in size with >50% respiratory variability, suggesting right atrial pressure of 8 mmHg.  6. Cannot exclude a small PFO. Comparison(s): No prior Echocardiogram. FINDINGS  Left Ventricle: Left ventricular ejection fraction, by estimation, is 50%. The left ventricle has low normal function. The left ventricle has no regional wall motion abnormalities. The left ventricular internal cavity size was normal  in size. There is no left ventricular hypertrophy. Left ventricular diastolic parameters are consistent with Grade I diastolic dysfunction (impaired relaxation). Right Ventricle: The right ventricular size is moderately enlarged. No increase in right ventricular wall thickness. Right ventricular systolic function is normal. Tricuspid regurgitation signal is inadequate for assessing PA pressure. Left Atrium: Left atrial size was normal in size. Right Atrium: Right atrial size was normal in size. Pericardium: There is no evidence of pericardial effusion. Mitral Valve: The mitral valve is normal in structure. Trivial mitral valve regurgitation. No evidence of mitral valve stenosis. Tricuspid Valve: The tricuspid valve is normal in structure. Tricuspid valve regurgitation is not demonstrated. No evidence of tricuspid stenosis. Aortic Valve: The aortic valve was not well visualized. Aortic valve regurgitation is not visualized. Pulmonic Valve: The pulmonic valve was normal in structure. Pulmonic valve regurgitation is not visualized. No evidence of pulmonic stenosis. Aorta: The aortic root and ascending  aorta are structurally normal, with no evidence of dilitation. Venous: The inferior vena cava is dilated in size with greater than 50% respiratory variability, suggesting right atrial pressure of 8 mmHg. IAS/Shunts: Cannot exclude a small PFO.  LEFT VENTRICLE PLAX 2D LVIDd:         4.50 cm   Diastology LVIDs:         3.40 cm   LV e' medial:    9.14 cm/s LV PW:         1.10 cm   LV E/e' medial:  6.7 LV IVS:        1.00 cm   LV e' lateral:   8.70 cm/s LVOT diam:     1.80 cm   LV E/e' lateral: 7.0 LV SV:         44 LV SV Index:   22 LVOT Area:     2.54 cm  RIGHT VENTRICLE             IVC RV S prime:     10.60 cm/s  IVC diam: 2.10 cm TAPSE (M-mode): 1.6 cm LEFT ATRIUM             Index        RIGHT ATRIUM           Index LA diam:        3.60 cm 1.85 cm/m   RA Area:     13.00 cm LA Vol (A2C):   43.8 ml 22.48 ml/m  RA Volume:   28.30 ml  14.53 ml/m LA Vol (A4C):   33.5 ml 17.20 ml/m LA Biplane Vol: 39.6 ml 20.33 ml/m  AORTIC VALVE LVOT Vmax:   89.40 cm/s LVOT Vmean:  56.700 cm/s LVOT VTI:    0.171 m  AORTA Ao Root diam: 3.10 cm Ao Asc diam:  3.30 cm MITRAL VALVE MV Area (PHT): 4.31 cm    SHUNTS MV Decel Time: 176 msec    Systemic VTI:  0.17 m MV E velocity: 61.30 cm/s  Systemic Diam: 1.80 cm MV A velocity: 99.60 cm/s MV E/A ratio:  0.62 Rudean Haskell MD Electronically signed by Rudean Haskell MD Signature Date/Time: 12/27/2021/1:45:31 PM    Final    UE Venous Duplex ([facility] and [facility] ONLY)  Result Date: 12/27/2021 UPPER VENOUS STUDY  Patient Name:  Sean Hull  Date of Exam:   12/26/2021 Medical Rec #: [id number]       Accession #:    [id number] Date of Birth: Oct 04, 1961       Patient Gender: M Patient Age:  60 years Exam Location:  [facility] Procedure:      VAS [place] UPPER EXTREMITY VENOUS DUPLEX Referring Phys: Regan Lemming --------------------------------------------------------------------------------  Indications: Swelling Risk Factors: None identified. Comparison Study: No prior studies.  Performing Technologist: Oliver Hum RVT  Examination Guidelines: A complete evaluation includes B-mode imaging, spectral Doppler, color Doppler, and power Doppler as needed of all accessible portions of each vessel. Bilateral testing is considered an integral part of a complete examination. Limited examinations for reoccurring indications may be performed as noted.  Right Findings: +----------+------------+---------+-----------+----------+-------+  RIGHT      Compressible Phasicity Spontaneous Properties Summary  +----------+------------+---------+-----------+----------+-------+  IJV            Full        Yes        Yes                         +----------+------------+---------+-----------+----------+-------+  Subclavian     Full        Yes        Yes                         +----------+------------+---------+-----------+----------+-------+  Axillary       Full        Yes        Yes                         +----------+------------+---------+-----------+----------+-------+  Brachial       Full        Yes        Yes                         +----------+------------+---------+-----------+----------+-------+  Radial         Full                                               +----------+------------+---------+-----------+----------+-------+  Ulnar          Full                                               +----------+------------+---------+-----------+----------+-------+  Cephalic       Full                                               +----------+------------+---------+-----------+----------+-------+  Basilic        Full                                               +----------+------------+---------+-----------+----------+-------+  Left Findings: +----------+------------+---------+-----------+----------+-------+  LEFT       Compressible Phasicity Spontaneous Properties Summary  +----------+------------+---------+-----------+----------+-------+  Subclavian     Full        Yes        Yes                          +----------+------------+---------+-----------+----------+-------+  Summary:  Right: No evidence of deep vein thrombosis in the upper extremity. No evidence of superficial vein thrombosis in the upper extremity.  Left: No evidence of thrombosis in the subclavian.  *See table(s) above for measurements and observations.  Diagnosing physician: Orlie Pollen Electronically signed by Orlie Pollen on 12/27/2021 at 6:50:00 AM.    Final         Scheduled Meds:  doxycycline  100 mg Oral Q12H   enoxaparin (LOVENOX) injection  40 mg Subcutaneous Q24H   fluticasone furoate-vilanterol  1 puff Inhalation Daily   furosemide  40 mg Intravenous Daily   ipratropium-albuterol  3 mL Nebulization Q4H   losartan  25 mg Oral Daily   methylPREDNISolone (SOLU-MEDROL) injection  80 mg Intravenous Q24H   pantoprazole  40 mg Oral Daily   sodium chloride flush  3 mL Intravenous Q12H   umeclidinium bromide  1 puff Inhalation Daily   Continuous Infusions:  sodium chloride       LOS: 0 days    Time spent: 35 minutes    Barb Merino, MD [facility] Pager [phone]

## 2021-12-27 NOTE — ED Notes (Signed)
Attempted to call sister, Sedalia Muta. No answer. VM full.

## 2021-12-27 NOTE — Progress Notes (Signed)
°  Echocardiogram 2D Echocardiogram has been performed.  Sean Hull 12/27/2021, 12:54 PM

## 2021-12-27 NOTE — ED Notes (Signed)
Pt sister, Lynnell Dike, updated by phone on patient status.

## 2021-12-27 NOTE — ED Notes (Signed)
Lunch tray delivered to pt at this time.

## 2021-12-27 NOTE — ED Notes (Signed)
Breakfast orders placed 

## 2021-12-28 DIAGNOSIS — M549 Dorsalgia, unspecified: Secondary | ICD-10-CM

## 2021-12-28 DIAGNOSIS — J432 Centrilobular emphysema: Secondary | ICD-10-CM

## 2021-12-28 DIAGNOSIS — I2584 Coronary atherosclerosis due to calcified coronary lesion: Secondary | ICD-10-CM

## 2021-12-28 DIAGNOSIS — I2510 Atherosclerotic heart disease of native coronary artery without angina pectoris: Secondary | ICD-10-CM

## 2021-12-28 DIAGNOSIS — J9601 Acute respiratory failure with hypoxia: Secondary | ICD-10-CM

## 2021-12-28 LAB — BASIC METABOLIC PANEL
Anion gap: 5 (ref 5–15)
BUN: 7 mg/dL (ref 6–20)
CO2: 37 mmol/L — ABNORMAL HIGH (ref 22–32)
Calcium: 8.1 mg/dL — ABNORMAL LOW (ref 8.9–10.3)
Chloride: 99 mmol/L (ref 98–111)
Creatinine, Ser: 0.81 mg/dL (ref 0.61–1.24)
GFR, Estimated: >60 mL/min (ref >60)
Glucose, Bld: 48 mg/dL — ABNORMAL LOW (ref 70–99)
Potassium: 3.8 mmol/L (ref 3.5–5.1)
Sodium: 141 mmol/L (ref 135–145)

## 2021-12-28 LAB — MAGNESIUM: Magnesium: 2.1 mg/dL (ref 1.7–2.4)

## 2021-12-28 LAB — PHOSPHORUS: Phosphorus: 3.6 mg/dL (ref 2.5–4.6)

## 2021-12-28 MED ORDER — OXYCODONE HCL 5 MG PO TABS
10.0000 mg | ORAL_TABLET | ORAL | Status: DC | PRN
  Administered 2021-12-28 (×2): 10 mg via ORAL
  Filled 2021-12-28 (×3): qty 2

## 2021-12-28 MED ORDER — FUROSEMIDE 10 MG/ML IJ SOLN
40.0000 mg | Freq: Two times a day (BID) | INTRAMUSCULAR | Status: DC
  Administered 2021-12-28 – 2021-12-29 (×2): 40 mg via INTRAVENOUS
  Filled 2021-12-28 (×2): qty 4

## 2021-12-28 MED ORDER — INFLUENZA VAC SPLIT QUAD 0.5 ML IM SUSY
0.5000 mL | PREFILLED_SYRINGE | INTRAMUSCULAR | Status: AC
  Administered 2021-12-29: 0.5 mL via INTRAMUSCULAR
  Filled 2021-12-28: qty 0.5

## 2021-12-28 MED ORDER — TRAZODONE HCL 50 MG PO TABS
50.0000 mg | ORAL_TABLET | Freq: Every day | ORAL | Status: DC
  Administered 2021-12-28: 23:00:00 50 mg via ORAL
  Filled 2021-12-28: qty 1

## 2021-12-28 NOTE — Assessment & Plan Note (Signed)
Continue pain control with oxycodone, follow with PT and OT recommendations.

## 2021-12-28 NOTE — Progress Notes (Signed)
I spoke over the phone with the patient's sister about patient's  condition, plan of care, prognosis and all questions were addressed.

## 2021-12-28 NOTE — Evaluation (Signed)
Physical Therapy Evaluation Patient Details Name: Sean Hull MRN: [id number] DOB: Apr 23, 1961 Today's Date: 12/28/2021  History of Present Illness  Patient is a 61 yo male presenting to the ED on 12/26/21 with shortness of breath, orthopnea, and worsening lower extremity edema.  Patient diagnosed with COPD exacerbation and CHF exacerbation. Patient is an active smoker with PMH of substance abuse, severe COPD and HTN.  Clinical Impression  Pt admitted with/for SOB due to both COPD and CHF exacerbation..  Pt currently limited functionally due to the problems listed below.  (see problems list.)  Pt will benefit from PT to maximize function and safety to be able to get home safely with available assist .        Recommendations for follow up therapy are one component of a multi-disciplinary discharge planning process, led by the attending physician.  Recommendations may be updated based on patient status, additional functional criteria and insurance authorization.  Follow Up Recommendations No PT follow up    Assistance Recommended at Discharge Intermittent Supervision/Assistance  Patient can return home with the following  Assistance with cooking/housework    Equipment Recommendations None recommended by PT  Recommendations for Other Services       Functional Status Assessment Patient has had a recent decline in their functional status and demonstrates the ability to make significant improvements in function in a reasonable and predictable amount of time.     Precautions / Restrictions Precautions Precautions: None      Mobility  Bed Mobility Overal bed mobility: Independent                  Transfers Overall transfer level: Independent Equipment used: None                    Ambulation/Gait Ambulation/Gait assistance: Supervision Gait Distance (Feet): 160 Feet (then additional 100 after sitting rest) Assistive device: None Gait Pattern/deviations:  Step-through pattern   Gait velocity interpretation: >2.62 ft/sec, indicative of community ambulatory   General Gait Details: steady, moderate speed, able to scan, change speed, but limited by oxygenation.  Sats  on RA at 1st sitting rest with 2-3/4 dyspnea mid 80's.  2nd check on return to room  HR 117 bpm at 85% saturation on RA.  Reapplied O2 at [facility].  Stairs Stairs: Yes Stairs assistance: Supervision Stair Management: One rail Left, Alternating pattern, Forwards Number of Stairs: 3 General stair comments: at minimal safe with rail  Wheelchair Mobility    Modified Rankin (Stroke Patients Only)       Balance Overall balance assessment: No apparent balance deficits (not formally assessed)                                           Pertinent Vitals/Pain Pain Assessment Pain Assessment: Faces Faces Pain Scale: Hurts a little bit Pain Location: Head and neck Pain Descriptors / Indicators: Discomfort, Headache Pain Intervention(s): Monitored during session    Home Living Family/patient expects to be discharged to:: Private residence Living Arrangements: Alone Available Help at Discharge: Family;Available PRN/intermittently (Niece across the street) Type of Home: Apartment Home Access: Other (comment) (Threshold to enter, apt on first floor)       Home Layout: One level Home Equipment: Shower seat - built in      Prior Function Prior Level of Function : Independent/Modified Independent;Driving;Working/employed  ADLs Comments: Was working 40hrs/wk at [employer] in [place] 40 hrs/wk, driving, and able to complete all ADLs without challenge     Hand Dominance        Extremity/Trunk Assessment   Upper Extremity Assessment Upper Extremity Assessment: Overall WFL for tasks assessed    Lower Extremity Assessment Lower Extremity Assessment: Overall WFL for tasks assessed    Cervical / Trunk Assessment Cervical / Trunk  Assessment: Back Surgery (Bulging discs in neck and back)  Communication   Communication: No difficulties  Cognition Arousal/Alertness: Awake/alert Behavior During Therapy: WFL for tasks assessed/performed Overall Cognitive Status: Within Functional Limits for tasks assessed                                          General Comments      Exercises     Assessment/Plan    PT Assessment Patient needs continued PT services  PT Problem List Decreased activity tolerance;Decreased mobility;Cardiopulmonary status limiting activity       PT Treatment Interventions Gait training;Functional mobility training;Therapeutic activities;Patient/family education    PT Goals (Current goals can be found in the Care Plan section)  Acute Rehab PT Goals Patient Stated Goal: Recover from this (SOB), back to work. PT Goal Formulation: With patient Time For Goal Achievement: 01/11/22 Potential to Achieve Goals: Good    Frequency Min 3X/week     Co-evaluation               AM-PAC PT "6 Clicks" Mobility  Outcome Measure Help needed turning from your back to your side while in a flat bed without using bedrails?: None Help needed moving from lying on your back to sitting on the side of a flat bed without using bedrails?: None Help needed moving to and from a bed to a chair (including a wheelchair)?: None Help needed standing up from a chair using your arms (e.g., wheelchair or bedside chair)?: None Help needed to walk in hospital room?: A Little Help needed climbing 3-5 steps with a railing? : A Little 6 Click Score: 22    End of Session Equipment Utilized During Treatment: Oxygen Activity Tolerance: Patient limited by fatigue;Other (comment) (limited by oxygenation of RA) Patient left: in bed;with call bell/phone within reach Nurse Communication: Mobility status PT Visit Diagnosis: Other abnormalities of gait and mobility (R26.89);Difficulty in walking, not elsewhere  classified (R26.2)    Time: 1448-1856 PT Time Calculation (min) (ACUTE ONLY): 17 min   Charges:   PT Evaluation $PT Eval Low Complexity: 1 Low          12/28/2021  Jacinto Halim., PT Acute Rehabilitation Services [phone]  (pager) [phone]  (office)  Eliseo Gum Aveer Bartow 12/28/2021, 4:48 PM

## 2021-12-28 NOTE — Progress Notes (Addendum)
Progress Note   Patient: Sean Hull [id number] DOB: 1961-02-09 DOA: 12/26/2021     1 DOS: the patient was seen and examined on 12/28/2021   Brief hospital course: Sean Hull was admitted to the hospital with the working diagnosis of acute heart failure exacerbation.   61 yo male with the past medical history of heart failure, severe COPD, HTN, and substance abuse who presented with dyspnea and edema. Reported progressive worsening lower extremity edema for 2 weeks, associated with orthopnea and worsening dyspnea on exertion. As out patient took furosemide with no significant improvement of her symptoms. Positive weight gain of 31 lbs from her baseline. [name] his blood pressure was 150/100, HR 89, RR 18 and oxygen saturatin 92% on room air. His lungs had decrease air movement with no wheezing, heart with S1 and S2, rhythmic, abdomen soft and positive lower extremity edema.   Na 141, K 5,0, CL 103, bicarbonate 29, BUN 19 and cr 1,0 BNP 1,868 High sensitive troponin 20 and 18 WBC 5,6, hgb 14,2, hct 46,8 and plt 190  SARS COVID 19 negative  Urine analysis with SG 1,028  Chest radiograph with hyperinflation with positive bibasilar atelectasis more left than right, positive hilar vascular congestion.   CT chest with no pulmonary embolism, diffuse emphysema, with 8-7 mm right lower lobe pulmonary nodule.   EKG with 89 bpm, right axis deviation, normal intervals, sinus rhythm, with no significant ST segments or T wave changes.   Assessment and Plan * Acute exacerbation of CHF (congestive heart failure) (HCC) Echocardiogram with EF LV 50%, consistent in diastolic heart failure, RV systolic function is preserved. Not able to assess PA pressures.  Documented urine output over last 24 hrs is 2,470 Continue to have edema at his lower extremities.  Plan to increase furosemide to 40 mg IV q12 hrs Continue blood pressure monitoring.    Centrilobular emphysema  (HCC)- (present on admission) CT chest with advance and diffuse emphysematous changes. No clinical signs of exacerbation.  Plan to discontinue steroids, continue with as needed bronchodilator therapy. Out bed to chair, PT and OT, nutrition consult.  Smoking cessation counseling.   For acute hypoxemic respiratory failure, continue oxymetry monitoring and will likely will need home 02   Hypertension- (present on admission) Blood pressure this am 161/92. Plan to continue blood pressure control with losartan.    Tobacco abuse- (present on admission) Smoking cessation counseling. Continue with nicotine replacement.   Back pain Continue pain control with oxycodone, follow with PT and OT recommendations.   Edema of right arm [place] of RUE negative for DVT No erythema or tenderness to suggest infection Continue to monitor with diuresis   Esophageal thickening Seen on CT in 04/2019 and EGD done at that time Findings: Candida esophagitis (biopsied). -Mild gastritis -S/p empiric esophageal dilatation. Recommended protonix 40mg  daily, not on this -start back protonix daily   Abnormal CT of the chest -prominent mediastinal lymph nodes: followed by pulm biopsied in past.stable.  -some new paratracheal lymph node enlargement, could be secondary to congestion/reactive -followed by pulm  F/u outpatient for paratracheal monitoring     Folliculitis- (present on admission) Improved rash will discontinue antibiotic therapy.      Subjective: [name], continue to have lower extremity edema, but dyspnea seems to be improving.  Positive back pain and neck pain.   Objective BP 161/92, HR 90, RR 13 and oxygen saturation 95% on 2 L/min per [place]  Neurologic, Patient is awake and alert ENT, no pallor or icterus Cardiovascular. Heart with S1 and S2 present and rhythmic, no gallops or murmurs. Positive lower extremity edema pitting ++ below the knees  bilaterally  Lungs. With bilateral rales but not wheezing, no rhonchi. Abdomen soft and non tender.  Skin with improved macular and papular rash.   Data Reviewed:  Labs reviewed   Family Communication: no family at the bedside   Disposition: Status is: Inpatient  Remains inpatient appropriate because: IV diuresis.        Author: Coralie Keens, MD 12/28/2021 9:45 AM  For on call review [email or web address].

## 2021-12-28 NOTE — Evaluation (Signed)
Occupational Therapy Evaluation Patient Details Name: Sean Hull MRN: [id number] DOB: Feb 11, 1961 Today's Date: 12/28/2021   History of Present Illness Patient is a 61 yo male presenting to the ED on 12/26/21 with shortness of breath, orthopnea, and worsening lower extremity edema.  Patient diagnosed with COPD exacerbation and CHF exacerbation. Patient is an active smoker with PMH of substance abuse, severe COPD and HTN.   Clinical Impression   Prior to this recent admission, patient was living independently and working full time at [employer] in [place]. Patient was driving and able to complete all ADLs and IADLs independently. At time of evaluation, patient is pretty close to baseline per patient report, and demonstrates no deficits from an ADL perspective. Patient would benefit from energy conservation and strategies to appropriately manage his COPD and CHF next session and unless further deficit are apparent, OT can sign off this time. No OT follow up is recommended, therapy will follow acutely.      Recommendations for follow up therapy are one component of a multi-disciplinary discharge planning process, led by the attending physician.  Recommendations may be updated based on patient status, additional functional criteria and insurance authorization.   Follow Up Recommendations  No OT follow up    Assistance Recommended at Discharge PRN  Patient can return home with the following      Functional Status Assessment  Patient has had a recent decline in their functional status and demonstrates the ability to make significant improvements in function in a reasonable and predictable amount of time.  Equipment Recommendations  None recommended by OT    Recommendations for Other Services       Precautions / Restrictions Precautions Precautions: None Restrictions Weight Bearing Restrictions: No      Mobility Bed Mobility Overal bed mobility: Independent                   Transfers Overall transfer level: Independent Equipment used: None                      Balance Overall balance assessment: No apparent balance deficits (not formally assessed)                                         ADL either performed or assessed with clinical judgement   ADL Overall ADL's : At baseline;Independent                                             Vision Baseline Vision/History: 0 No visual deficits Patient Visual Report: No change from baseline       Perception     Praxis      Pertinent Vitals/Pain Pain Assessment Pain Assessment: 0-10 Pain Score: 6  Pain Location: Head and neck Pain Descriptors / Indicators: Discomfort, Headache Pain Intervention(s): Limited activity within patient's tolerance, Monitored during session, Repositioned, Patient requesting pain meds-RN notified     Hand Dominance     Extremity/Trunk Assessment Upper Extremity Assessment Upper Extremity Assessment: Overall WFL for tasks assessed   Lower Extremity Assessment Lower Extremity Assessment: Defer to PT evaluation   Cervical / Trunk Assessment Cervical / Trunk Assessment: Normal;Back Surgery (Bulging discs in neck and back)   Communication Communication Communication: No difficulties   Cognition  Arousal/Alertness: Awake/alert Behavior During Therapy: WFL for tasks assessed/performed Overall Cognitive Status: Within Functional Limits for tasks assessed                                       General Comments       Exercises     Shoulder Instructions      Home Living Family/patient expects to be discharged to:: Private residence Living Arrangements: Alone Available Help at Discharge: Family;Available PRN/intermittently (Niece across the street) Type of Home: Apartment Home Access: Other (comment) (Threshold to enter, apt on first floor)     Home Layout: One level     Bathroom Shower/Tub:  Walk-in shower (Seat in shower if needed)   Bathroom Toilet: Standard     Home Equipment: Shower seat - built in          Prior Functioning/Environment Prior Level of Function : Independent/Modified Independent;Driving;Working/employed               ADLs Comments: Was working 40hrs/wk at [employer] in [place] 40 hrs/wk, driving, and able to complete all ADLs without challenge        OT Problem List: Decreased activity tolerance;Decreased knowledge of precautions;Cardiopulmonary status limiting activity;Increased edema;Pain      OT Treatment/Interventions: Energy conservation;Other (comment);Self-care/ADL training;Therapeutic activities;Therapeutic exercise;DME and/or AE instruction (CHF/COPD education)    OT Goals(Current goals can be found in the care plan section) Acute Rehab OT Goals Patient Stated Goal: To get home and back to work OT Goal Formulation: With patient Time For Goal Achievement: 01/11/22 Potential to Achieve Goals: Good  OT Frequency: Min 2X/week    Co-evaluation              AM-PAC OT "6 Clicks" Daily Activity     Outcome Measure Help from another person eating meals?: None Help from another person taking care of personal grooming?: None Help from another person toileting, which includes using toliet, bedpan, or urinal?: None Help from another person bathing (including washing, rinsing, drying)?: None Help from another person to put on and taking off regular upper body clothing?: None Help from another person to put on and taking off regular lower body clothing?: None 6 Click Score: 24   End of Session Equipment Utilized During Treatment: Oxygen Nurse Communication: Mobility status;Patient requests pain meds  Activity Tolerance: Patient tolerated treatment well Patient left: in bed (sitting EOB at end of session)  OT Visit Diagnosis: Pain;Other abnormalities of gait and mobility (R26.89) Pain - part of body:  (Head and neck)                 Time: 1224-4975 OT Time Calculation (min): 10 min Charges:  OT General Charges $OT Visit: 1 Visit OT Evaluation $OT Eval Moderate Complexity: 1 Mod  [name], OTR/L Acute Rehabilitation Services [phone]   [name] 12/28/2021, 11:47 AM

## 2021-12-28 NOTE — Hospital Course (Addendum)
Sean Hull was admitted to the hospital with the working diagnosis of acute heart failure exacerbation.   61 yo male with the past medical history of heart failure, severe COPD, HTN, and substance abuse who presented with dyspnea and edema. Reported progressive worsening lower extremity edema for 2 weeks, associated with orthopnea and worsening dyspnea on exertion. As out patient took furosemide with no significant improvement of her symptoms. Positive weight gain of 31 lbs from her baseline. [name] his blood pressure was 150/100, HR 89, RR 18 and oxygen saturatin 92% on room air. His lungs had decrease air movement with no wheezing, heart with S1 and S2, rhythmic, abdomen soft and positive lower extremity edema.   Na 141, K 5,0, CL 103, bicarbonate 29, BUN 19 and cr 1,0 BNP 1,868 High sensitive troponin 20 and 18 WBC 5,6, hgb 14,2, hct 46,8 and plt 190  SARS COVID 19 negative  Urine analysis with SG 1,028  Chest radiograph with hyperinflation with positive bibasilar atelectasis more left than right, positive hilar vascular congestion.   CT chest with no pulmonary embolism, diffuse emphysema, with 8-7 mm right lower lobe pulmonary nodule.   EKG with 89 bpm, right axis deviation, normal intervals, sinus rhythm, with no significant ST segments or T wave changes.

## 2021-12-28 NOTE — Assessment & Plan Note (Addendum)
Echocardiogram with EF LV 50%, consistent in diastolic heart failure, RV systolic function is preserved. Not able to assess PA pressures.  Documented urine output over last 24 hrs is 2,470 Continue to have edema at his lower extremities.  Plan to increase furosemide to 40 mg IV q12 hrs Continue blood pressure monitoring.

## 2021-12-29 ENCOUNTER — Other Ambulatory Visit (HOSPITAL_COMMUNITY): Payer: Self-pay

## 2021-12-29 DIAGNOSIS — M545 Low back pain, unspecified: Secondary | ICD-10-CM

## 2021-12-29 LAB — BASIC METABOLIC PANEL
Anion gap: 7 (ref 5–15)
BUN: 11 mg/dL (ref 6–20)
CO2: 40 mmol/L — ABNORMAL HIGH (ref 22–32)
Calcium: 8.4 mg/dL — ABNORMAL LOW (ref 8.9–10.3)
Chloride: 96 mmol/L — ABNORMAL LOW (ref 98–111)
Creatinine, Ser: 0.87 mg/dL (ref 0.61–1.24)
GFR, Estimated: >60 mL/min (ref >60)
Glucose, Bld: 84 mg/dL (ref 70–99)
Potassium: 4.1 mmol/L (ref 3.5–5.1)
Sodium: 143 mmol/L (ref 135–145)

## 2021-12-29 MED ORDER — FUROSEMIDE 20 MG PO TABS: 20.0000 mg | ORAL_TABLET | Freq: Every day | ORAL | Status: DC

## 2021-12-29 MED ORDER — LOSARTAN POTASSIUM 25 MG PO TABS
25.0000 mg | ORAL_TABLET | Freq: Every day | ORAL | 0 refills | Status: DC
  Filled 2021-12-29: qty 30, 30d supply, fill #0

## 2021-12-29 MED ORDER — FUROSEMIDE 20 MG PO TABS
20.0000 mg | ORAL_TABLET | Freq: Every day | ORAL | 0 refills | Status: DC
  Filled 2021-12-29: qty 30, 30d supply, fill #0

## 2021-12-29 NOTE — Discharge Summary (Addendum)
Physician Discharge Summary  Sean Hull [id number] DOB: May 02, 1961 DOA: 12/26/2021  PCP: Sean Hull (Inactive)  Admit date: 12/26/2021 Discharge date: 12/29/2021  Admitted From: Home  Disposition:  Home   Recommendations for Outpatient Follow-up and new medication changes:  Follow up with Primary Care in 7 to 10 days.  Patient has been placed on losartan for blood pressure control and furosemide for diuresis (increase furosemide to bid in case of edema or weight gain 3 Lbs in 48 hrs or 5 Lbs in 7 days.  Home 02  Smoking cessation   Home Health: no   Equipment/Devices: home 02    Discharge Condition: stable  CODE STATUS: full   Diet recommendation:  heart healthy   Brief/Interim Summary: Sean Hull was admitted to the hospital with the working diagnosis of acute heart failure exacerbation.    61 yo male with the past medical history of heart failure, severe COPD, HTN, and substance abuse who presented with dyspnea and edema. Reported progressive worsening lower extremity edema for 2 weeks, associated with orthopnea and worsening dyspnea on exertion. As out patient took furosemide with no significant improvement of his symptoms. Positive weight gain of 31 lbs from her baseline. [name] his blood pressure was 150/100, HR 89, RR 18 and oxygen saturatin 92% on room air. His lungs had decrease air movement with no wheezing, heart with S1 and S2, rhythmic, abdomen soft and positive lower extremity edema.    Na 141, K 5,0, CL 103, bicarbonate 29, BUN 19 and cr 1,0 BNP 1,868 High sensitive troponin 20 and 18 WBC 5,6, hgb 14,2, hct 46,8 and plt 190   SARS COVID 19 negative   Urine analysis with SG 1,028   Chest radiograph with hyperinflation with positive bibasilar atelectasis more left than right, positive hilar vascular congestion.    CT chest with no pulmonary embolism, diffuse emphysema, with 8-7 mm right lower lobe pulmonary nodule.    EKG  with 89 bpm, right axis deviation, normal intervals, sinus rhythm, with no significant ST segments or T wave changes.   Patient was placed on IV furosemide with improvement in his symptoms.  Plan to follow up as outpatient.   Acute on chronic diastolic heart failure, with acute hypoxemic respiratory failure Patient was admitted to the cardiac ward, he received IV furosemide with good toleration. Negative fluid balance was achieved, -5.460 ml, with significant improvement in his symptoms.   Patient will continue taking furosemide at home 20 mg and instructions to increase to bid in case of edema or weight gain.  Heart failure management with ARB, will hold on B blockade for now due to advance emphysema.   2. HTN. Patient was placed on losartan for blood pressure control.   3. COPD with diffuse emphysema, acute on chronic respiratory failure.  Mild exacerbation, patient received systemic corticosteroids short course along with bronchodilator therapy. Inhaled steroids.  Smoking cessation   4. Right arm edema. Ruled out for DVT Hull ultrasound.   5. Back pain. Continue with analgesics, avoid non steroidal anti inflammatory agents.   6. Pulmonary nodules. 8 by 7 mm right lower lobe pulmonary nodule stable from 2020.  Follow as outpatient.   7. Folliculitis improved with short course of doxycycline.   Discharge Diagnoses:  Principal Problem:   Acute exacerbation of CHF (congestive heart failure) (HCC) Active Problems:   Centrilobular emphysema (HCC)   Hypertension   Tobacco abuse   Coronary artery calcification   Folliculitis  Acute respiratory failure with hypoxia (HCC)   Back pain    Discharge Instructions   Allergies as of 12/29/2021   No Known Allergies      Medication List     STOP taking these medications    BC HEADACHE POWDER PO   GOODY HEADACHE PO   varenicline 0.5 MG X 11 & 1 MG X 42 tablet Commonly known as: CHANTIX PAK       TAKE these medications     Breztri Aerosphere 160-9-4.8 MCG/ACT Aero Generic drug: Budeson-Glycopyrrol-Formoterol Inhale 2 puffs into the lungs in the morning and at bedtime.   furosemide 20 MG tablet Commonly known as: LASIX Take 1 tablet (20 mg total) by mouth daily. Take twice daily in case of leg swelling or wight increase 3 lbs in 48 hrs or 5 lbs in 7 days. Start taking on: December 30, 2021   ipratropium-albuterol 0.5-2.5 (3) MG/3ML Soln Commonly known as: DUONEB Take 3 mLs by nebulization every 6 (six) hours as needed (shortness of breath or wheezing).   losartan 25 MG tablet Commonly known as: COZAAR Take 1 tablet (25 mg total) by mouth daily. Start taking on: December 30, 2021   ProAir HFA 108 (90 Base) MCG/ACT inhaler Generic drug: albuterol Inhale 1-2 puffs into the lungs every 6 (six) hours as needed for wheezing or shortness of breath.   albuterol 108 (90 Base) MCG/ACT inhaler Commonly known as: VENTOLIN HFA Inhale 2 puffs into the lungs every 6 (six) hours as needed for wheezing or shortness of breath.               Durable Medical Equipment  (From admission, onward)           Start     Ordered   12/29/21 1140  For home use only DME oxygen  Once       Comments: At rest use 2 L and on ambulation use 3 L.  Question Answer Comment  Length of Need 6 Months   Mode or (Route) Nasal cannula   Liters Hull Minute 2   Frequency Continuous (stationary and portable oxygen unit needed)   Oxygen conserving device Yes   Oxygen delivery system Gas      12/29/21 1139            Follow-up Information     [facility] at [facility] Follow up.   Specialty: Family Medicine Why: Please follow up in a week Contact information: [address], [place] [phone]               No Known Allergies     Procedures/Studies: DG Chest 2 View  Result Date: 12/26/2021 CLINICAL DATA:  Shortness of breath.  Fluid buildup. EXAM: CHEST - 2  VIEW COMPARISON:  12/26/2021 and 10/13/2019 chest radiographs. 08/13/2021 chest CT. FINDINGS: Cardiac silhouette and mediastinal contours are within normal limits. There is mild increased interstitial thickening predominantly within the lung bases. Increased lucencies are again seen consistent with the centrilobular emphysematous changes seen on prior chest CT. No definite pleural effusion. Flattening of the diaphragms with moderate hyperinflation, unchanged. No pneumothorax. Mild multilevel degenerative disc changes of the thoracic spine. IMPRESSION: Chronic hyperinflation. Possible mild bilateral basilar interstitial thickening minimal edema versus scarring. Electronically Signed   By: Yvonne Kendall M.D.   On: 12/26/2021 11:51   DG Chest 2 View  Result Date: 12/26/2021 CLINICAL DATA:  Pneumonia. EXAM: CHEST - 2 VIEW COMPARISON:  Chest radiograph and CT chest  08/13/2021. FINDINGS: Trachea is midline. Heart size stable. No dense airspace consolidation. Lungs are hyperinflated. No pleural fluid. IMPRESSION: Hyperinflation without acute finding. Electronically Signed   By: Lorin Picket M.D.   On: 12/26/2021 09:30   CT Angio Chest PE W and/or Wo Contrast  Result Date: 12/26/2021 CLINICAL DATA:  Shortness of breath.  Pitting edema. EXAM: CT ANGIOGRAPHY CHEST WITH CONTRAST TECHNIQUE: Multidetector CT imaging of the chest was performed using the standard protocol during bolus administration of intravenous contrast. Multiplanar CT image reconstructions and MIPs were obtained to evaluate the vascular anatomy. RADIATION DOSE REDUCTION: This exam was performed according to the departmental dose-optimization program which includes automated exposure control, adjustment of the mA and/or kV according to patient size and/or use of iterative reconstruction technique. CONTRAST:  136mL OMNIPAQUE IOHEXOL 350 MG/ML SOLN COMPARISON:  Multiple exams, including chest radiograph 12/26/2021 and CTA chest from 08/13/2021  FINDINGS: Cardiovascular: No filling defect is identified in the pulmonary arterial tree to suggest pulmonary embolus. Coronary, aortic arch, and branch vessel atherosclerotic vascular disease. Heart size within normal limits. Mediastinum/Nodes: Mild diffuse wall thickening of the esophagus. Left paratracheal node 1.1 cm in short axis, image 68 series 5 (formerly 0.8 cm). Right hilar node 1.1 cm in short axis, image 85 series 5 (stable). Clustered AP window lymph nodes similar to prior. Lungs/Pleura: Biapical pleuroparenchymal scarring. Prominent emphysema. Improved aeration in the right middle lobe although a small amount of airspace opacity persist for example on image 143 series 7. 8 by 7 mm right lower lobe pulmonary nodule on image 125 series 7, stable from 07/08/2019 slightly decreased in size from 01/25/2019. Some improvement in the adjacent branching nodularity in the left lower lobe previously shown. Upper Abdomen: Upper abdominal ascites and mesenteric edema. Substantial subcutaneous edema along the upper abdomen, increased from 08/13/2021. Abdominal aortic atherosclerosis. Musculoskeletal: Left clavicular deformity from an old healed fracture. Review of the MIP images confirms the above findings. IMPRESSION: 1. No filling defect is identified in the pulmonary arterial tree to suggest pulmonary embolus. 2. Prominent emphysema.  Emphysema (ICD10-J43.9). 3. Upper abdominal ascites with increased mesenteric and subcutaneous edema compatible with third spacing of fluid. 4. Improved airspace opacities in the right middle lobe and right lower lobe. 5. An 8 by 7 mm right lower lobe pulmonary nodule is stable from 07/08/2019 and slightly decreased in size from 01/25/2019. This favors a benign etiology. 6. Mildly prominent mediastinal and right hilar lymph nodes, mostly similar to previous although a left paratracheal lymph node has mildly enlarged. These may well be congested or reactive. 7. Mild diffuse wall  thickening in the esophagus. Esophagitis be a common cause. 8.  Aortic Atherosclerosis (ICD10-I70.0).  Coronary atherosclerosis. Electronically Signed   By: Van Clines M.D.   On: 12/26/2021 15:58   ECHOCARDIOGRAM COMPLETE  Result Date: 12/27/2021    ECHOCARDIOGRAM REPORT   Patient Name:   Sean Hull Date of Exam: 12/27/2021 Medical Rec #:  [id number]      Height:       71.0 in Accession #:    [id number]     Weight:       166.0 lb Date of Birth:  1961-01-27      BSA:          1.948 m Patient Age:    30 years       BP:           173/86 mmHg Patient Gender: M  HR:           79 bpm. Exam Location:  Inpatient Procedure: 2D Echo Indications:    congestive heart failure  History:        Patient has no prior history of Echocardiogram examinations.                 COPD, Signs/Symptoms:Shortness of Breath; Risk                 Factors:Hypertension and Current Smoker.  Sonographer:    Johny Chess RDCS Referring Phys: [id number] [place]  1. Left ventricular ejection fraction, by estimation, is 50%. The left ventricle has low normal function. The left ventricle has no regional wall motion abnormalities. Left ventricular diastolic parameters are consistent with Grade I diastolic dysfunction (impaired relaxation).  2. Right ventricular systolic function is normal. The right ventricular size is dilated. Tricuspid regurgitation signal is inadequate for assessing PA pressure.  3. The mitral valve is normal in structure. Trivial mitral valve regurgitation. No evidence of mitral stenosis.  4. The aortic valve was not well visualized. Aortic valve regurgitation is not visualized.  5. The inferior vena cava is dilated in size with >50% respiratory variability, suggesting right atrial pressure of 8 mmHg.  6. Cannot exclude a small PFO. Comparison(s): No prior Echocardiogram. FINDINGS  Left Ventricle: Left ventricular ejection fraction, by estimation, is 50%. The left ventricle has low normal  function. The left ventricle has no regional wall motion abnormalities. The left ventricular internal cavity size was normal in size. There is no left ventricular hypertrophy. Left ventricular diastolic parameters are consistent with Grade I diastolic dysfunction (impaired relaxation). Right Ventricle: The right ventricular size is moderately enlarged. No increase in right ventricular wall thickness. Right ventricular systolic function is normal. Tricuspid regurgitation signal is inadequate for assessing PA pressure. Left Atrium: Left atrial size was normal in size. Right Atrium: Right atrial size was normal in size. Pericardium: There is no evidence of pericardial effusion. Mitral Valve: The mitral valve is normal in structure. Trivial mitral valve regurgitation. No evidence of mitral valve stenosis. Tricuspid Valve: The tricuspid valve is normal in structure. Tricuspid valve regurgitation is not demonstrated. No evidence of tricuspid stenosis. Aortic Valve: The aortic valve was not well visualized. Aortic valve regurgitation is not visualized. Pulmonic Valve: The pulmonic valve was normal in structure. Pulmonic valve regurgitation is not visualized. No evidence of pulmonic stenosis. Aorta: The aortic root and ascending aorta are structurally normal, with no evidence of dilitation. Venous: The inferior vena cava is dilated in size with greater than 50% respiratory variability, suggesting right atrial pressure of 8 mmHg. IAS/Shunts: Cannot exclude a small PFO.  LEFT VENTRICLE PLAX 2D LVIDd:         4.50 cm   Diastology LVIDs:         3.40 cm   LV e' medial:    9.14 cm/s LV PW:         1.10 cm   LV E/e' medial:  6.7 LV IVS:        1.00 cm   LV e' lateral:   8.70 cm/s LVOT diam:     1.80 cm   LV E/e' lateral: 7.0 LV SV:         44 LV SV Index:   22 LVOT Area:     2.54 cm  RIGHT VENTRICLE             IVC RV S prime:     10.60  cm/s  IVC diam: 2.10 cm TAPSE (M-mode): 1.6 cm LEFT ATRIUM             Index        RIGHT  ATRIUM           Index LA diam:        3.60 cm 1.85 cm/m   RA Area:     13.00 cm LA Vol (A2C):   43.8 ml 22.48 ml/m  RA Volume:   28.30 ml  14.53 ml/m LA Vol (A4C):   33.5 ml 17.20 ml/m LA Biplane Vol: 39.6 ml 20.33 ml/m  AORTIC VALVE LVOT Vmax:   89.40 cm/s LVOT Vmean:  56.700 cm/s LVOT VTI:    0.171 m  AORTA Ao Root diam: 3.10 cm Ao Asc diam:  3.30 cm MITRAL VALVE MV Area (PHT): 4.31 cm    SHUNTS MV Decel Time: 176 msec    Systemic VTI:  0.17 m MV E velocity: 61.30 cm/s  Systemic Diam: 1.80 cm MV A velocity: 99.60 cm/s MV E/A ratio:  0.62 Rudean Haskell MD Electronically signed by Rudean Haskell MD Signature Date/Time: 12/27/2021/1:45:31 PM    Final    UE Venous Duplex ([facility] and [facility] ONLY)  Result Date: 12/27/2021 UPPER VENOUS STUDY  Patient Name:  Sean Hull  Date of Exam:   12/26/2021 Medical Rec #: [id number]       Accession #:    [id number] Date of Birth: Apr 03, 1961       Patient Gender: M Patient Age:   89 years Exam Location:  [facility] Procedure:      VAS [place] UPPER EXTREMITY VENOUS DUPLEX Referring Phys: Regan Lemming --------------------------------------------------------------------------------  Indications: Swelling Risk Factors: None identified. Comparison Study: No prior studies. Performing Technologist: Oliver Hum RVT  Examination Guidelines: A complete evaluation includes B-mode imaging, spectral Doppler, color Doppler, and power Doppler as needed of all accessible portions of each vessel. Bilateral testing is considered an integral part of a complete examination. Limited examinations for reoccurring indications may be performed as noted.  Right Findings: +----------+------------+---------+-----------+----------+-------+  RIGHT      Compressible Phasicity Spontaneous Properties Summary  +----------+------------+---------+-----------+----------+-------+  IJV            Full        Yes        Yes                          +----------+------------+---------+-----------+----------+-------+  Subclavian     Full        Yes        Yes                         +----------+------------+---------+-----------+----------+-------+  Axillary       Full        Yes        Yes                         +----------+------------+---------+-----------+----------+-------+  Brachial       Full        Yes        Yes                         +----------+------------+---------+-----------+----------+-------+  Radial         Full                                               +----------+------------+---------+-----------+----------+-------+  Ulnar          Full                                               +----------+------------+---------+-----------+----------+-------+  Cephalic       Full                                               +----------+------------+---------+-----------+----------+-------+  Basilic        Full                                               +----------+------------+---------+-----------+----------+-------+  Left Findings: +----------+------------+---------+-----------+----------+-------+  LEFT       Compressible Phasicity Spontaneous Properties Summary  +----------+------------+---------+-----------+----------+-------+  Subclavian     Full        Yes        Yes                         +----------+------------+---------+-----------+----------+-------+  Summary:  Right: No evidence of deep vein thrombosis in the upper extremity. No evidence of superficial vein thrombosis in the upper extremity.  Left: No evidence of thrombosis in the subclavian.  *See table(s) above for measurements and observations.  Diagnosing physician: Orlie Pollen Electronically signed by Orlie Pollen on 12/27/2021 at 6:50:00 AM.    Final        Subjective: [name], his dyspnea is back to baseline and his lower extremity edema has improved.   Discharge Exam: Vitals:   12/28/21 1834 12/28/21 2107  BP: 134/84 (!) 155/85  Pulse: 93 85   Resp: 16 13  Temp: 98.7 F (37.1 C) 98.9 F (37.2 C)  SpO2: 93% 94%   Vitals:   12/28/21 0907 12/28/21 1834 12/28/21 2107 12/29/21 0600  BP: (!) 161/92 134/84 (!) 155/85   Pulse:  93 85   Resp:  16 13   Temp:  98.7 F (37.1 C) 98.9 F (37.2 C)   TempSrc:  Oral Oral   SpO2:  93% 94%   Weight:    61.9 kg  Height:        General: Not in pain or dyspnea  Neurology: Awake and alert, non focal  E ENT: no pallor, no icterus, oral mucosa moist Cardiovascular: No JVD. S1-S2 present, rhythmic, no gallops, rubs, or murmurs. Trace bilateral lower extremity edema. Pulmonary: positive breath sounds bilaterally, prolonged expiratory phase with no wheezing or rhonchi Gastrointestinal. Abdomen soft and non tender Skin. No rashes Musculoskeletal: no joint deformities   The results of significant diagnostics from this hospitalization (including imaging, microbiology, ancillary and laboratory) are listed below for reference.     Microbiology: Recent Results (from the past 240 hour(s))  Resp Panel by RT-PCR (Flu A&B, Covid) Nasopharyngeal Swab     Status: None   Collection Time: 12/26/21 10:52 AM   Specimen: Nasopharyngeal Swab; Nasopharyngeal(NP) swabs in vial transport medium  Result Value Ref Range Status   SARS Coronavirus 2 by RT PCR NEGATIVE NEGATIVE Final    Comment: (NOTE) SARS-CoV-2 target nucleic acids are NOT DETECTED.  The SARS-CoV-2 RNA  is generally detectable in upper respiratory specimens during the acute phase of infection. The lowest concentration of SARS-CoV-2 viral copies this assay can detect is 138 copies/mL. A negative result does not preclude SARS-Cov-2 infection and should not be used as the sole basis for treatment or other patient management decisions. A negative result may occur with  improper specimen collection/handling, submission of specimen other than nasopharyngeal swab, presence of viral mutation(s) within the areas targeted by this assay, and  inadequate number of viral copies(<138 copies/mL). A negative result must be combined with clinical observations, patient history, and epidemiological information. The expected result is Negative.  Fact Sheet for Patients:  EntrepreneurPulse.com.au  Fact Sheet for Healthcare Providers:  IncredibleEmployment.be  This test is no t yet approved or cleared by the [place] FDA and  has been authorized for detection and/or diagnosis of SARS-CoV-2 by FDA under an Emergency Use Authorization (EUA). This EUA will remain  in effect (meaning this test can be used) for the duration of the COVID-19 declaration under Section 564(b)(1) of the Act, 21 U.S.C.section 360bbb-3(b)(1), unless the authorization is terminated  or revoked sooner.       Influenza A by PCR NEGATIVE NEGATIVE Final   Influenza B by PCR NEGATIVE NEGATIVE Final    Comment: (NOTE) The Xpert Xpress SARS-CoV-2/FLU/RSV plus assay is intended as an aid in the diagnosis of influenza from Nasopharyngeal swab specimens and should not be used as a sole basis for treatment. Nasal washings and aspirates are unacceptable for Xpert Xpress SARS-CoV-2/FLU/RSV testing.  Fact Sheet for Patients: EntrepreneurPulse.com.au  Fact Sheet for Healthcare Providers: IncredibleEmployment.be  This test is not yet approved or cleared by the [place] FDA and has been authorized for detection and/or diagnosis of SARS-CoV-2 by FDA under an Emergency Use Authorization (EUA). This EUA will remain in effect (meaning this test can be used) for the duration of the COVID-19 declaration under Section 564(b)(1) of the Act, 21 U.S.C. section 360bbb-3(b)(1), unless the authorization is terminated or revoked.  Performed at [facility], [place] [address] [place], [place] [address]      Labs: BNP (last 3 results) Recent Labs    12/26/21 1056  BNP 1,868.7*   Basic  Metabolic Panel: Recent Labs  Lab 12/26/21 1056 12/27/21 0330 12/28/21 0348 12/29/21 0343  NA 141 142 141 143  K 5.0 3.8 3.8 4.1  CL 103 103 99 96*  CO2 29 32 37* 40*  GLUCOSE 83 74 48* 84  BUN 18 13 7 11   CREATININE 1.03 0.87 0.81 0.87  CALCIUM 8.1* 7.7* 8.1* 8.4*  MG 2.0  --  2.1  --   PHOS  --   --  3.6  --    Liver Function Tests: Recent Labs  Lab 12/26/21 1056  AST 28  ALT 15  ALKPHOS 67  BILITOT 0.5  PROT 6.4*  ALBUMIN 3.3*   No results for input(s): LIPASE, AMYLASE in the last 168 hours. No results for input(s): AMMONIA in the last 168 hours. CBC: Recent Labs  Lab 12/26/21 1056 12/27/21 0330  WBC 5.6 5.7  NEUTROABS 4.3  --   HGB 14.2 13.4  HCT 46.8 43.4  MCV 103.3* 99.5  PLT 190 173   Cardiac Enzymes: No results for input(s): CKTOTAL, CKMB, CKMBINDEX, TROPONINI in the last 168 hours. BNP: Invalid input(s): POCBNP CBG: No results for input(s): GLUCAP in the last 168 hours. D-Dimer No results for input(s): DDIMER in the last 72 hours. Hgb A1c No results for  input(s): HGBA1C in the last 72 hours. Lipid Profile No results for input(s): CHOL, HDL, LDLCALC, TRIG, CHOLHDL, LDLDIRECT in the last 72 hours. Thyroid function studies No results for input(s): TSH, T4TOTAL, T3FREE, THYROIDAB in the last 72 hours.  Invalid input(s): FREET3 Anemia work up No results for input(s): VITAMINB12, FOLATE, FERRITIN, TIBC, IRON, RETICCTPCT in the last 72 hours. Urinalysis    Component Value Date/Time   COLORURINE YELLOW 12/27/2021 0330   APPEARANCEUR CLEAR 12/27/2021 0330   LABSPEC 1.028 12/27/2021 0330   PHURINE 5.0 12/27/2021 0330   GLUCOSEU NEGATIVE 12/27/2021 0330   HGBUR NEGATIVE 12/27/2021 0330   BILIRUBINUR NEGATIVE 12/27/2021 0330   KETONESUR NEGATIVE 12/27/2021 0330   PROTEINUR NEGATIVE 12/27/2021 0330   NITRITE NEGATIVE 12/27/2021 0330   LEUKOCYTESUR NEGATIVE 12/27/2021 0330   Sepsis Labs Invalid input(s): PROCALCITONIN,  WBC,   LACTICIDVEN Microbiology Recent Results (from the past 240 hour(s))  Resp Panel by RT-PCR (Flu A&B, Covid) Nasopharyngeal Swab     Status: None   Collection Time: 12/26/21 10:52 AM   Specimen: Nasopharyngeal Swab; Nasopharyngeal(NP) swabs in vial transport medium  Result Value Ref Range Status   SARS Coronavirus 2 by RT PCR NEGATIVE NEGATIVE Final    Comment: (NOTE) SARS-CoV-2 target nucleic acids are NOT DETECTED.  The SARS-CoV-2 RNA is generally detectable in upper respiratory specimens during the acute phase of infection. The lowest concentration of SARS-CoV-2 viral copies this assay can detect is 138 copies/mL. A negative result does not preclude SARS-Cov-2 infection and should not be used as the sole basis for treatment or other patient management decisions. A negative result may occur with  improper specimen collection/handling, submission of specimen other than nasopharyngeal swab, presence of viral mutation(s) within the areas targeted by this assay, and inadequate number of viral copies(<138 copies/mL). A negative result must be combined with clinical observations, patient history, and epidemiological information. The expected result is Negative.  Fact Sheet for Patients:  BloggerCourse.com  Fact Sheet for Healthcare Providers:  SeriousBroker.it  This test is no t yet approved or cleared by the [place] FDA and  has been authorized for detection and/or diagnosis of SARS-CoV-2 by FDA under an Emergency Use Authorization (EUA). This EUA will remain  in effect (meaning this test can be used) for the duration of the COVID-19 declaration under Section 564(b)(1) of the Act, 21 U.S.C.section 360bbb-3(b)(1), unless the authorization is terminated  or revoked sooner.       Influenza A by PCR NEGATIVE NEGATIVE Final   Influenza B by PCR NEGATIVE NEGATIVE Final    Comment: (NOTE) The Xpert Xpress SARS-CoV-2/FLU/RSV plus  assay is intended as an aid in the diagnosis of influenza from Nasopharyngeal swab specimens and should not be used as a sole basis for treatment. Nasal washings and aspirates are unacceptable for Xpert Xpress SARS-CoV-2/FLU/RSV testing.  Fact Sheet for Patients: BloggerCourse.com  Fact Sheet for Healthcare Providers: SeriousBroker.it  This test is not yet approved or cleared by the [place] FDA and has been authorized for detection and/or diagnosis of SARS-CoV-2 by FDA under an Emergency Use Authorization (EUA). This EUA will remain in effect (meaning this test can be used) for the duration of the COVID-19 declaration under Section 564(b)(1) of the Act, 21 U.S.C. section 360bbb-3(b)(1), unless the authorization is terminated or revoked.  Performed at [facility], [address] [place], [place] [address]      Time coordinating discharge: 45 minutes  SIGNED:   Coralie Keens, MD  [facility]  12/29/2021, 11:57 AM

## 2021-12-29 NOTE — Progress Notes (Signed)
Initial Nutrition Assessment  DOCUMENTATION CODES:   Non-severe (moderate) malnutrition in context of chronic illness  INTERVENTION:  -MVI -Provided low sodium diet education    NUTRITION DIAGNOSIS:   Moderate Malnutrition related to chronic illness (COPD and CHF) as evidenced by moderate fat depletion, moderate muscle depletion.   GOAL:   Patient will meet greater than or equal to 90% of their needs   MONITOR:   PO intake, Weight trends, I & O's  REASON FOR ASSESSMENT:   Consult Assessment of nutrition requirement/status  ASSESSMENT:   Pt is a 61 y.o. male with PMH significant for severe COPD, HTN, acute respiratory failure with hypoxia and substance use disorder who presented to the ED for SOB and worsening lower extremity edema.  Per chart review, pt reports having swelling in his lower legs that has progressed two weeks ago up his legs/stomach. Pt was diagnosed with acute exacerbation of CHF.   Visited with pt at bedside. Pt alert and oriented. Per pt, pt reports that his appetite has been great and he has consumed 100% of all of his meals. Per pt, he had a banana, oatmeal, sausage, and eggs for breakfast today with juice. Pt reports that he typically eats 2 meals/day and typically skips breakfast. For lunch he may typically stop somewhere on the road and get a burger and potato chips. Per pt, he typically has chicken, burger, or something that his niece brings to him for dinner. Per pt, pt lives alone, but his niece lives right across the street from him. Pt also reports that he has frozen meals for dinner sometimes.   Per documentation, meal completion of 100% reported for breakfast, lunch, and dinner yesterday, as well as for breakfast today (1/26).   Reviewed weight encounters. Per documented weights, pt has lost 13 kg of body weight since 1/23. Suspect outlier weight on 1/23 is due to fluid overload. Per pt, pt UBW is 135-140#. Pt does not report any weight changes  that he can account for.   Discussed with pt the importance of good nutrition and adequate po intake for a healthful diet. Discussed with pt ways to reduce sodium intake and encouraged pt to read food labels when grocery shopping and choosing frozen meals with less sodium. Provided pt with handout from the Academy of Nutrition and Dietetics, "Low-Sodium Nutrition Therapy." Encouraged pt to continue consuming complex carbohydrate foods such as oatmeal and bananas. Discussed the importance of reducing sodium and taking medications as prescribed to prevent further hospitalization. Teach back method used. Expect fair compliance.   Did not discuss supplement regimen as pt was preparing to be discharged.   Medications reviewed and include: protonix, lasix   Labs reviewed.   I/O's: -5.2 L since admission  UOP: 3975 mL x 24 hrs   NUTRITION - FOCUSED PHYSICAL EXAM:  Flowsheet Row Most Recent Value  Orbital Region Moderate depletion  Upper Arm Region Severe depletion  Thoracic and Lumbar Region Moderate depletion  Buccal Region Moderate depletion  Temple Region Moderate depletion  Clavicle Bone Region Severe depletion  Clavicle and Acromion Bone Region Moderate depletion  Scapular Bone Region Moderate depletion  Dorsal Hand Moderate depletion  Patellar Region Moderate depletion  Anterior Thigh Region Moderate depletion  Posterior Calf Region Moderate depletion  Edema (RD Assessment) None  Hair Reviewed  Eyes Reviewed  Mouth Reviewed  Skin Reviewed  Nails Reviewed       Diet Order:   Diet Order  Diet - low sodium heart healthy           Diet regular Room service appropriate? Yes; Fluid consistency: Thin  Diet effective now                   EDUCATION NEEDS:   Education needs have been addressed  Skin:  Skin Assessment: Reviewed RN Assessment  Last BM:  1/25  Height:   Ht Readings from Last 1 Encounters:  12/27/21 5\' 11"  (1.803 m)    Weight:   Wt  Readings from Last 1 Encounters:  12/29/21 61.9 kg    Ideal Body Weight:  78.2 kg  BMI:  Body mass index is 19.02 kg/m.  Estimated Nutritional Needs:   Kcal:  1900 - 2100  Protein:  95 - 110 grams  Fluid:  > 1.9 L    2101, Dietetic Intern 12/29/2021 2:07 PM

## 2021-12-29 NOTE — Plan of Care (Signed)
Problem: Education: Goal: Ability to demonstrate management of disease process will improve Outcome: Completed/Met Goal: Ability to verbalize understanding of medication therapies will improve Outcome: Completed/Met Goal: Individualized Educational Video(s) Outcome: Completed/Met   Problem: Activity: Goal: Capacity to carry out activities will improve Outcome: Completed/Met   Problem: Cardiac: Goal: Ability to achieve and maintain adequate cardiopulmonary perfusion will improve Outcome: Completed/Met   Problem: Acute Rehab OT Goals (only OT should resolve) Goal: Pt. Will Perform Tub/Shower Transfer Outcome: Completed/Met Goal: OT Additional ADL Goal #1 Outcome: Completed/Met Goal: OT Additional ADL Goal #2 Outcome: Completed/Met   Problem: Acute Rehab PT Goals(only PT should resolve) Goal: Patient Will Transfer Sit To/From Stand Outcome: Completed/Met Goal: Pt Will Transfer Bed To Chair/Chair To Bed Outcome: Completed/Met Goal: Pt Will Ambulate Outcome: Completed/Met   Problem: Education: Goal: Knowledge of General Education information will improve Description: Including pain rating scale, medication(s)/side effects and non-pharmacologic comfort measures Outcome: Completed/Met   Problem: Health Behavior/Discharge Planning: Goal: Ability to manage health-related needs will improve Outcome: Completed/Met   Problem: Clinical Measurements: Goal: Ability to maintain clinical measurements within normal limits will improve Outcome: Completed/Met Goal: Will remain free from infection Outcome: Completed/Met Goal: Diagnostic test results will improve Outcome: Completed/Met Goal: Respiratory complications will improve Outcome: Completed/Met Goal: Cardiovascular complication will be avoided Outcome: Completed/Met   Problem: Activity: Goal: Risk for activity intolerance will decrease Outcome: Completed/Met   Problem: Nutrition: Goal: Adequate nutrition will be  maintained Outcome: Completed/Met   Problem: Coping: Goal: Level of anxiety will decrease Outcome: Completed/Met   Problem: Elimination: Goal: Will not experience complications related to bowel motility Outcome: Completed/Met Goal: Will not experience complications related to urinary retention Outcome: Completed/Met   Problem: Pain Managment: Goal: General experience of comfort will improve Outcome: Completed/Met   Problem: Safety: Goal: Ability to remain free from injury will improve Outcome: Completed/Met   Problem: Skin Integrity: Goal: Risk for impaired skin integrity will decrease Outcome: Completed/Met

## 2021-12-29 NOTE — Progress Notes (Signed)
Pt discharged to home via private vehicle driven by sister.  Home oxygen delivered by oxygen supplier. Discharge instructions reviewed with patient.  These included, but were not limited to, the following:  discharge prescriptions, when to call the MD, follow-up appointments, recommended diet, low sodium options, activity recommendations, importance of weighing self daily, etc.  Escorted to exit via wheelchair by nurse tech.

## 2021-12-29 NOTE — TOC Transition Note (Signed)
Transition of Care [facility]) - CM/SW Discharge Note   Patient Details  Name: Sean Hull MRN: [id number] Date of Birth: 26-Apr-1961  Transition of Care [facility]) CM/SW Contact:  Leone Haven, RN Phone Number: 12/29/2021, 11:54 AM   Clinical Narrative:    NCM spoke with patient at the bedside, asked if he has preference of DME company for his oxygen, he states no.  NCM made referral to [facility] with [employer]. This will be brought up to patient's room.  He states he will also have transportation at dc. He does not have a PCP, follow up apt on AVS.   Final next level of care: Home/Self Care Barriers to Discharge: No Barriers Identified   Patient Goals and CMS Choice Patient states their goals for this hospitalization and ongoing recovery are:: return home   Choice offered to / list presented to : NA  Discharge Placement                       Discharge Plan and Services                DME Arranged: Oxygen DME Agency: [employer] Date DME Agency Contacted: 12/29/21 Time DME Agency Contacted: [phone] Representative spoke with at DME Agency: Ian Malkin HH Arranged: NA          Social Determinants of Health (SDOH) Interventions     Readmission Risk Interventions No flowsheet data found.

## 2021-12-29 NOTE — Progress Notes (Signed)
Physical Therapy Treatment Patient Details Name: Sean Hull MRN: [id number] DOB: 1961/01/14 Today's Date: 12/29/2021   History of Present Illness Patient is a 61 yo male presenting to the ED on 12/26/21 with shortness of breath, orthopnea, and worsening lower extremity edema.  Patient diagnosed with COPD exacerbation and CHF exacerbation. Patient is an active smoker with PMH of substance abuse, severe COPD and HTN.    PT Comments    The pt was agreeable to session despite reports of poor sleep overnight resulting in fatigue this morning. SpO2 maintained in 90s on 2L at rest, but dropped to low of 85% with 3/4 DOE with exertion. Therefore, the pt was increased to 3L O2 for mobility, and SpO2 maintained in 90s with mobility. Pt educated on progressive walking program and self-monitoring of exertion/work of breathing. Will continue to benefit from skilled PT to progress mobility, endurance, and independence with HEP.   Gait Speed: 0.70m/s with supervision but no AD. (Gait speed < 1.55m/s indicates increased risk of falls)  5X Sit-to-Stand: 15.23 sec (> 11.4 sec indicates increased risk of falls for individuals aged 60-69, > 15 sec indicates increased risk of recurrent falls)    Recommendations for follow up therapy are one component of a multi-disciplinary discharge planning process, led by the attending physician.  Recommendations may be updated based on patient status, additional functional criteria and insurance authorization.  Follow Up Recommendations  No PT follow up     Assistance Recommended at Discharge Intermittent Supervision/Assistance  Patient can return home with the following Assistance with cooking/housework   Equipment Recommendations  None recommended by PT    Recommendations for Other Services       Precautions / Restrictions Precautions Precautions: None Precaution Comments: 3L O2 during mobility Restrictions Weight Bearing Restrictions: No     Mobility  Bed  Mobility Overal bed mobility: Independent                  Transfers Overall transfer level: Independent Equipment used: None               General transfer comment: 5x sit-stand 15.23 sec    Ambulation/Gait Ambulation/Gait assistance: Supervision Gait Distance (Feet): 175 Feet (+ 175 ft) Assistive device: None Gait Pattern/deviations: Step-through pattern Gait velocity: 0.7 m/s Gait velocity interpretation: 1.31 - 2.62 ft/sec, indicative of limited community ambulator   General Gait Details: pt with steady gait and good speed, needs 3L O2 to maintain SpO2 >90%. low of 85% with standing rest but pt recovered to 94% with PLB       Balance Overall balance assessment: No apparent balance deficits (not formally assessed)                                          Cognition Arousal/Alertness: Awake/alert Behavior During Therapy: WFL for tasks assessed/performed Overall Cognitive Status: Within Functional Limits for tasks assessed                                          Exercises      General Comments General comments (skin integrity, edema, etc.): VSS on 2L at rest, on 3L with mobility with SpO2 >90 with gait but dropped to low of 85% after exertion      Pertinent Vitals/Pain Pain Assessment Pain Assessment:  No/denies pain Pain Intervention(s): Monitored during session     PT Goals (current goals can now be found in the care plan section) Acute Rehab PT Goals Patient Stated Goal: Recover from this (SOB), back to work. PT Goal Formulation: With patient Time For Goal Achievement: 01/11/22 Potential to Achieve Goals: Good Progress towards PT goals: Progressing toward goals    Frequency    Min 3X/week      PT Plan Current plan remains appropriate       AM-PAC PT "6 Clicks" Mobility   Outcome Measure  Help needed turning from your back to your side while in a flat bed without using bedrails?: None Help needed  moving from lying on your back to sitting on the side of a flat bed without using bedrails?: None Help needed moving to and from a bed to a chair (including a wheelchair)?: None Help needed standing up from a chair using your arms (e.g., wheelchair or bedside chair)?: None Help needed to walk in hospital room?: A Little Help needed climbing 3-5 steps with a railing? : A Little 6 Click Score: 22    End of Session Equipment Utilized During Treatment: Oxygen Activity Tolerance: Patient limited by fatigue Patient left: in bed;with call bell/phone within reach Nurse Communication: Mobility status PT Visit Diagnosis: Other abnormalities of gait and mobility (R26.89);Difficulty in walking, not elsewhere classified (R26.2)     Time: [id number] PT Time Calculation (min) (ACUTE ONLY): 19 min  Charges:  $Therapeutic Exercise: 8-22 mins                     West Carbo, PT, DPT   Acute Rehabilitation Department Pager #: [phone]   Sandra Cockayne 12/29/2021, 8:38 AM

## 2022-01-04 ENCOUNTER — Ambulatory Visit: Payer: No Typology Code available for payment source | Admitting: Nurse Practitioner

## 2022-01-06 ENCOUNTER — Encounter: Payer: Self-pay | Admitting: Nurse Practitioner

## 2022-01-06 ENCOUNTER — Ambulatory Visit (INDEPENDENT_AMBULATORY_CARE_PROVIDER_SITE_OTHER): Payer: No Typology Code available for payment source | Admitting: Nurse Practitioner

## 2022-01-06 ENCOUNTER — Other Ambulatory Visit: Payer: Self-pay

## 2022-01-06 VITALS — BP 178/99 | HR 104 | Temp 98.0°F | Ht 71.0 in | Wt 148.4 lb

## 2022-01-06 DIAGNOSIS — Z0000 Encounter for general adult medical examination without abnormal findings: Secondary | ICD-10-CM | POA: Diagnosis not present

## 2022-01-06 DIAGNOSIS — I10 Essential (primary) hypertension: Secondary | ICD-10-CM

## 2022-01-06 DIAGNOSIS — J441 Chronic obstructive pulmonary disease with (acute) exacerbation: Secondary | ICD-10-CM

## 2022-01-06 DIAGNOSIS — I5033 Acute on chronic diastolic (congestive) heart failure: Secondary | ICD-10-CM

## 2022-01-06 DIAGNOSIS — M25562 Pain in left knee: Secondary | ICD-10-CM

## 2022-01-06 DIAGNOSIS — Z720 Tobacco use: Secondary | ICD-10-CM

## 2022-01-06 DIAGNOSIS — Z129 Encounter for screening for malignant neoplasm, site unspecified: Secondary | ICD-10-CM

## 2022-01-06 DIAGNOSIS — M25561 Pain in right knee: Secondary | ICD-10-CM

## 2022-01-06 DIAGNOSIS — R609 Edema, unspecified: Secondary | ICD-10-CM | POA: Diagnosis not present

## 2022-01-06 LAB — POCT GLYCOSYLATED HEMOGLOBIN (HGB A1C)
HbA1c POC (<> result, manual entry): 5.6 % (ref 4.0–5.6)
HbA1c, POC (controlled diabetic range): 5.6 % (ref 0.0–7.0)
HbA1c, POC (prediabetic range): 5.6 % — AB (ref 5.7–6.4)
Hemoglobin A1C: 5.6 % (ref 4.0–5.6)

## 2022-01-06 MED ORDER — NICOTINE POLACRILEX 4 MG MT LOZG: 4.0000 mg | LOZENGE | OROMUCOSAL | 0 refills | Status: DC | PRN

## 2022-01-06 MED ORDER — MELOXICAM 7.5 MG PO TABS: 7.5000 mg | ORAL_TABLET | Freq: Every day | ORAL | 0 refills | Status: DC

## 2022-01-06 MED ORDER — LOSARTAN POTASSIUM-HCTZ 50-12.5 MG PO TABS: 1.0000 | ORAL_TABLET | Freq: Every day | ORAL | 2 refills | Status: DC

## 2022-01-06 NOTE — Progress Notes (Signed)
[facility] Patient [facility] [address] [place], [place]  [address] Phone:  [phone]   Fax:  [phone] Subjective:   Patient ID: Sean Hull, male    DOB: 23-Apr-1961, 61 y.o.   MRN: [id number]  Chief Complaint  Patient presents with   Establish Care    Pt is here to establish care no questions or concerns    HPI ERIC HEWSON 61 y.o. male  has a past medical history of Acute respiratory failure with hypoxia (HCC), Arthritis, CAP (community acquired pneumonia), COPD (chronic obstructive pulmonary disease) (HCC), Dyspnea, Pneumonia, Substance use disorder, and Tobacco use. To the [facility] to establish care. Patient states that he was admitted to the hospital on 1/23 for shortness of breath and chest tightness.  States that since being discharged from the hospital, he continues to have shortness of breath and chest tightness, with moderate improvement. Was discharged from the hospital with O2, currently 2L via [place]. States that he is compliant with all medications, but continues to have generalized weakness and fatigue. Employed at a [employer], but has been unable to work due to malaise feeling. Last worked 3-4 wks ago.   Requesting medication for pain in BLE. States that he has radiating pain from bilateral knees, mots pronounced in the mornings, but does have some pain at night. Denies any pain during today's visit.   Denies any substance usage. Currently smokes 0.5 PPD x 45 yrs. Requesting assistance with smoking cessation. Current social drinker, stopped drinking in large amounts 4-5 mths ago. Generally eats healthy and walks as much as possible, but is limited due to shortness of breath. Denies any other complaints today. Requesting referral for colonoscopy.  Denies any fever. Denies any fatigue, HA or dizziness. Denies any blurred vision, numbness or tingling.  Past Medical History:  Diagnosis Date   Acute respiratory failure with hypoxia (HCC)    Arthritis    hands    CAP (community acquired pneumonia)    COPD (chronic obstructive pulmonary disease) (HCC)    Dyspnea    with exertion   Pneumonia    Substance use disorder    Tobacco use     Past Surgical History:  Procedure Laterality Date   BACK SURGERY     Disectomy    CYST EXCISION     left side of face   ESOPHAGEAL DILATION     ROTATOR CUFF REPAIR Right     x2   UPPER GASTROINTESTINAL ENDOSCOPY     VIDEO BRONCHOSCOPY WITH ENDOBRONCHIAL ULTRASOUND N/A 02/12/2019   Procedure: VIDEO BRONCHOSCOPY WITH ENDOBRONCHIAL ULTRASOUND;  Surgeon: Leslye Peer, MD;  Location: [facility] OR;  Service: Thoracic;  Laterality: N/A;    Family History  Problem Relation Age of Onset   Diabetes Mother    Heart disease Mother    Hypertension Mother    Hypertension Father    Colon cancer Neg Hx    Esophageal cancer Neg Hx    Colon polyps Neg Hx    Rectal cancer Neg Hx    Stomach cancer Neg Hx     Social History   Socioeconomic History   Marital status: Widowed    Spouse name: Not on file   Number of children: Not on file   Years of education: Not on file   Highest education level: Not on file  Occupational History   Not on file  Tobacco Use   Smoking status: Every Day    Packs/day: 1.50    Years:  40.00    Pack years: 60.00    Types: Cigarettes   Smokeless tobacco: Never   Tobacco comments:    Smoking 1/2 ppd as of 12/26/2021 hfb  Vaping Use   Vaping Use: Never used  Substance and Sexual Activity   Alcohol use: Yes    Alcohol/week: 6.0 standard drinks    Types: 6 Cans of beer per week    Comment: per week   Drug use: Not Currently    Types: "Crack" cocaine    Comment: crack - last time  1 month or so ago- it was days before hospitalization in [place], 01/25/2019.   Sexual activity: Yes  Other Topics Concern   Not on file  Social History Narrative   Not on file   Social Determinants of Health   Financial Resource Strain: Not on file  Food Insecurity: Not on file  Transportation Needs:  Not on file  Physical Activity: Not on file  Stress: Not on file  Social Connections: Not on file  Intimate Partner Violence: Not on file    Outpatient Medications Prior to Visit  Medication Sig Dispense Refill   albuterol (VENTOLIN HFA) 108 (90 Base) MCG/ACT inhaler Inhale 2 puffs into the lungs every 6 (six) hours as needed for wheezing or shortness of breath. 18 g 3   Budeson-Glycopyrrol-Formoterol (BREZTRI AEROSPHERE) 160-9-4.8 MCG/ACT AERO Inhale 2 puffs into the lungs in the morning and at bedtime. 10.7 g 6   furosemide (LASIX) 20 MG tablet Take 1 tablet (20 mg total) by mouth daily. Take twice daily in case of leg swelling or wight increase 3 lbs in 48 hrs or 5 lbs in 7 days. 30 tablet 0   ipratropium-albuterol (DUONEB) 0.5-2.5 (3) MG/3ML SOLN Take 3 mLs by nebulization every 6 (six) hours as needed (shortness of breath or wheezing). 360 mL 3   PROAIR HFA 108 (90 Base) MCG/ACT inhaler Inhale 1-2 puffs into the lungs every 6 (six) hours as needed for wheezing or shortness of breath.     losartan (COZAAR) 25 MG tablet Take 1 tablet (25 mg total) by mouth daily. 30 tablet 0   No facility-administered medications prior to visit.    No Known Allergies  Review of Systems  Constitutional:  Positive for malaise/fatigue. Negative for chills and fever.  HENT: Negative.    Eyes: Negative.   Respiratory:  Positive for shortness of breath. Negative for cough.   Cardiovascular:  Positive for chest pain and leg swelling. Negative for palpitations.       Chest tightness  Gastrointestinal:  Negative for abdominal pain, blood in stool, constipation, diarrhea, heartburn, melena, nausea and vomiting.  Genitourinary: Negative.   Musculoskeletal:  Positive for back pain and joint pain.  Skin: Negative.   Neurological: Negative.   Psychiatric/Behavioral:  Negative for depression and substance abuse. The patient is nervous/anxious.   All other systems reviewed and are negative.     Objective:     Physical Exam Constitutional:      General: He is not in acute distress.    Appearance: Normal appearance. He is normal weight. He is ill-appearing.  HENT:     Head: Normocephalic.     Right Ear: Tympanic membrane, ear canal and external ear normal. There is no impacted cerumen.     Left Ear: Tympanic membrane, ear canal and external ear normal. There is no impacted cerumen.     Nose: Nose normal.     Mouth/Throat:     Mouth: Mucous membranes are moist.  Pharynx: Oropharynx is clear.  Eyes:     Extraocular Movements: Extraocular movements intact.     Conjunctiva/sclera: Conjunctivae normal.     Pupils: Pupils are equal, round, and reactive to light.  Neck:     Vascular: No carotid bruit.  Cardiovascular:     Rate and Rhythm: Normal rate and regular rhythm.     Pulses: Normal pulses.     Heart sounds: Normal heart sounds.     Comments: Moderate, non pitting, BLE edema  Pulmonary:     Effort: Pulmonary effort is normal.     Breath sounds: No decreased air movement. Examination of the right-upper field reveals decreased breath sounds. Examination of the left-upper field reveals decreased breath sounds. Examination of the right-middle field reveals decreased breath sounds. Examination of the left-middle field reveals decreased breath sounds. Examination of the right-lower field reveals decreased breath sounds. Examination of the left-lower field reveals decreased breath sounds. Decreased breath sounds and rhonchi present. No wheezing or rales.     Comments: Audible diffuse rhonchi Abdominal:     General: Abdomen is flat. Bowel sounds are normal. There is no distension.     Palpations: Abdomen is soft. There is no mass.     Tenderness: There is no abdominal tenderness. There is no right CVA tenderness, left CVA tenderness, guarding or rebound.     Hernia: No hernia is present.  Musculoskeletal:        General: Swelling present. Normal range of motion.     Cervical back: Normal range  of motion and neck supple. No rigidity or tenderness.     Right lower leg: Edema present.     Left lower leg: Edema present.     Comments: Moderate swelling noted to BLE  Lymphadenopathy:     Cervical: No cervical adenopathy.  Skin:    General: Skin is warm and dry.     Capillary Refill: Capillary refill takes less than 2 seconds.  Neurological:     General: No focal deficit present.     Mental Status: He is alert and oriented to person, place, and time.     Cranial Nerves: No cranial nerve deficit.     Sensory: No sensory deficit.     Motor: Tremor present. No weakness.     Coordination: Coordination is intact. Coordination normal.     Gait: Gait normal.     Deep Tendon Reflexes: Reflexes normal.     Comments: Noted tremors with movement in bilateral hands  Psychiatric:        Mood and Affect: Mood normal.        Behavior: Behavior normal.        Thought Content: Thought content normal.        Judgment: Judgment normal.    BP (!) 178/99    Pulse (!) 104    Temp 98 F (36.7 C)    Ht 5\' 11"  (1.803 m)    Wt 148 lb 6 oz (67.3 kg)    SpO2 99%    BMI 20.69 kg/m  Wt Readings from Last 3 Encounters:  01/06/22 148 lb 6 oz (67.3 kg)  12/29/21 136 lb 6.4 oz (61.9 kg)  12/26/21 166 lb (75.3 kg)    Immunization History  Administered Date(s) Administered   Influenza,inj,Quad PF,6+ Mos 11/10/2019, 12/29/2021   Pneumococcal Polysaccharide-23 11/10/2019    Diabetic Foot Exam - Simple   No data filed     No results found for: TSH Lab Results  Component Value Date  WBC 5.7 12/27/2021   HGB 13.4 12/27/2021   HCT 43.4 12/27/2021   MCV 99.5 12/27/2021   PLT 173 12/27/2021   Lab Results  Component Value Date   NA 143 12/29/2021   K 4.1 12/29/2021   CO2 40 (H) 12/29/2021   GLUCOSE 84 12/29/2021   BUN 11 12/29/2021   CREATININE 0.87 12/29/2021   BILITOT 0.5 12/26/2021   ALKPHOS 67 12/26/2021   AST 28 12/26/2021   ALT 15 12/26/2021   PROT 6.4 (L) 12/26/2021   ALBUMIN 3.3  (L) 12/26/2021   CALCIUM 8.4 (L) 12/29/2021   ANIONGAP 7 12/29/2021   No results found for: CHOL No results found for: HDL No results found for: LDLCALC No results found for: TRIG No results found for: CHOLHDL Lab Results  Component Value Date   HGBA1C 5.6 01/06/2022   HGBA1C 5.6 01/06/2022   HGBA1C 5.6 (A) 01/06/2022   HGBA1C 5.6 01/06/2022       Assessment & Plan:   Problem List Items Addressed This Visit       Cardiovascular and Mediastinum   Hypertension   Relevant Medications   losartan-hydrochlorothiazide (HYZAAR) 50-12.5 MG tablet Encouraged continued diet and exercise efforts  Encouraged continued compliance with medication     Acute exacerbation of CHF (congestive heart failure) (HCC)   Relevant Medications   losartan-hydrochlorothiazide (HYZAAR) 50-12.5 MG tablet   Other Relevant Orders   Ambulatory referral to Cardiology   AMB referral to CHF clinic     Respiratory   COPD with acute exacerbation ([place])   Relevant Medications   nicotine polacrilex (NICOTINE MINI) 4 MG lozenge Continue taking prescribed medications and O2   Other Relevant Orders   Ambulatory referral to Pulmonology     Other   Tobacco abuse   Relevant Medications   nicotine polacrilex (NICOTINE MINI) 4 MG lozenge Discussed non pharmacological methods for cessation   Other Visit Diagnoses     Health care maintenance    -  Primary   Relevant Orders   POCT glycosylated hemoglobin (Hb A1C) (Completed): 5.6, prediabetic   CBC with Differential/Platelet   Comprehensive metabolic panel   Lipid panel   Peripheral edema       Relevant Medications   losartan-hydrochlorothiazide (HYZAAR) 50-12.5 MG tablet Discussed diet at length Discussed non pharmacological methods for management   Arthralgia of both knees       Relevant Medications   meloxicam (MOBIC) 7.5 MG tablet   Screening for cancer       Relevant Orders   Ambulatory referral to Gastroenterology   Follow up in 1 mth for  reevaluation of hypertension and anxiety, sooner as needed     I have discontinued Nelda Severe. [place]mares's losartan. I am also having him start on losartan-hydrochlorothiazide, meloxicam, and nicotine polacrilex. Additionally, I am having him maintain his ProAir HFA, Breztri Aerosphere, albuterol, ipratropium-albuterol, and furosemide.  Meds ordered this encounter  Medications   losartan-hydrochlorothiazide (HYZAAR) 50-12.5 MG tablet    Sig: Take 1 tablet by mouth daily.    Dispense:  30 tablet    Refill:  2   meloxicam (MOBIC) 7.5 MG tablet    Sig: Take 1 tablet (7.5 mg total) by mouth daily.    Dispense:  30 tablet    Refill:  0   nicotine polacrilex (NICOTINE MINI) 4 MG lozenge    Sig: Take 1 lozenge (4 mg total) by mouth as needed for smoking cessation.    Dispense:  100 tablet    Refill:  0     Teena Dunk, NP

## 2022-01-06 NOTE — Patient Instructions (Signed)
You were seen today in the [facility] to establish care and for reevaluation of chronic illness. Labs were collected, results will be available via MyChart or, if abnormal, you will be contacted by clinic staff. You were prescribed medications, please take as directed. Please follow up in 1 for reevaluation of chronic illness and anxiety.

## 2022-01-07 LAB — CBC WITH DIFFERENTIAL/PLATELET
Basophils Absolute: 0.1 10*3/uL (ref 0.0–0.2)
Basos: 1 %
EOS (ABSOLUTE): 0 10*3/uL (ref 0.0–0.4)
Eos: 1 %
Hematocrit: 42.6 % (ref 37.5–51.0)
Hemoglobin: 14 g/dL (ref 13.0–17.7)
Immature Grans (Abs): 0 10*3/uL (ref 0.0–0.1)
Immature Granulocytes: 1 %
Lymphocytes Absolute: 1.1 10*3/uL (ref 0.7–3.1)
Lymphs: 19 %
MCH: 31.1 pg (ref 26.6–33.0)
MCHC: 32.9 g/dL (ref 31.5–35.7)
MCV: 95 fL (ref 79–97)
Monocytes Absolute: 0.5 10*3/uL (ref 0.1–0.9)
Monocytes: 8 %
Neutrophils Absolute: 4.2 10*3/uL (ref 1.4–7.0)
Neutrophils: 70 %
Platelets: 271 10*3/uL (ref 150–450)
RBC: 4.5 x10E6/uL (ref 4.14–5.80)
RDW: 14.5 % (ref 11.6–15.4)
WBC: 5.8 10*3/uL (ref 3.4–10.8)

## 2022-01-07 LAB — COMPREHENSIVE METABOLIC PANEL
ALT: 19 IU/L (ref 0–44)
AST: 24 IU/L (ref 0–40)
Albumin/Globulin Ratio: 1.9 (ref 1.2–2.2)
Albumin: 4.3 g/dL (ref 3.8–4.9)
Alkaline Phosphatase: 78 IU/L (ref 44–121)
BUN/Creatinine Ratio: 11 (ref 10–24)
BUN: 11 mg/dL (ref 8–27)
Bilirubin Total: 0.3 mg/dL (ref 0.0–1.2)
CO2: 28 mmol/L (ref 20–29)
Calcium: 8.7 mg/dL (ref 8.6–10.2)
Chloride: 102 mmol/L (ref 96–106)
Creatinine, Ser: 0.98 mg/dL (ref 0.76–1.27)
Globulin, Total: 2.3 g/dL (ref 1.5–4.5)
Glucose: 106 mg/dL — ABNORMAL HIGH (ref 70–99)
Potassium: 6 mmol/L — ABNORMAL HIGH (ref 3.5–5.2)
Sodium: 142 mmol/L (ref 134–144)
Total Protein: 6.6 g/dL (ref 6.0–8.5)
eGFR: 88 mL/min/{1.73_m2} (ref >59)

## 2022-01-07 LAB — LIPID PANEL
Chol/HDL Ratio: 2.2 ratio (ref 0.0–5.0)
Cholesterol, Total: 170 mg/dL (ref 100–199)
HDL: 78 mg/dL (ref >39)
LDL Chol Calc (NIH): 82 mg/dL (ref 0–99)
Triglycerides: 50 mg/dL (ref 0–149)
VLDL Cholesterol Cal: 10 mg/dL (ref 5–40)

## 2022-01-23 NOTE — Progress Notes (Deleted)
Cardiology Office Note:    Date:  01/23/2022   ID:  Sean Hull, DOB 1961/03/29, MRN [id number]  PCP:  Sean Dunk, NP  Cardiologist:  None  Electrophysiologist:  None   Referring MD: Sean Dunk, NP   No chief complaint on file. ***  History of Present Illness:    Sean Hull is a 61 y.o. male with a hx of chronic diastolic heart failure, severe COPD, hypertension, substance abuse who is referred by Sean Merino, NP for evaluation of heart failure.  He was admitted in with acute on chronic diastolic heart failure in January 2023.  Reported weight gain of 31 pounds from baseline.  He responded well to IV diuresis and was discharged on Lasix 20 mg daily.  Echocardiogram 12/27/2021 showed EF [id number], grade 1 diastolic dysfunction, normal RV function, no significant valvular disease.  ***Nsaids  Past Medical History:  Diagnosis Date   Acute respiratory failure with hypoxia (HCC)    Arthritis    hands   CAP (community acquired pneumonia)    COPD (chronic obstructive pulmonary disease) (HCC)    Dyspnea    with exertion   Pneumonia    Substance use disorder    Tobacco use     Past Surgical History:  Procedure Laterality Date   BACK SURGERY     Disectomy    CYST EXCISION     left side of face   ESOPHAGEAL DILATION     ROTATOR CUFF REPAIR Right     x2   UPPER GASTROINTESTINAL ENDOSCOPY     VIDEO BRONCHOSCOPY WITH ENDOBRONCHIAL ULTRASOUND N/A 02/12/2019   Procedure: VIDEO BRONCHOSCOPY WITH ENDOBRONCHIAL ULTRASOUND;  Surgeon: Collene Gobble, MD;  Location: [facility] OR;  Service: Thoracic;  Laterality: N/A;    Current Medications: No outpatient medications have been marked as taking for the 01/26/22 encounter (Appointment) with Donato Heinz, MD.     Allergies:   Patient has no known allergies.   Social History   Socioeconomic History   Marital status: Widowed    Spouse name: Not on file   Number of children: Not on file   Years of education: Not  on file   Highest education level: Not on file  Occupational History   Not on file  Tobacco Use   Smoking status: Every Day    Packs/day: 1.50    Years: 40.00    Pack years: 60.00    Types: Cigarettes   Smokeless tobacco: Never   Tobacco comments:    Smoking 1/2 ppd as of 12/26/2021 hfb  Vaping Use   Vaping Use: Never used  Substance and Sexual Activity   Alcohol use: Yes    Alcohol/week: 6.0 standard drinks    Types: 6 Cans of beer per week    Comment: per week   Drug use: Not Currently    Types: "Crack" cocaine    Comment: crack - last time  1 month or so ago- it was days before hospitalization in [place], 01/25/2019.   Sexual activity: Yes  Other Topics Concern   Not on file  Social History Narrative   Not on file   Social Determinants of Health   Financial Resource Strain: Not on file  Food Insecurity: Not on file  Transportation Needs: Not on file  Physical Activity: Not on file  Stress: Not on file  Social Connections: Not on file     Family History: The patient's ***family history includes Diabetes in his mother; Heart disease in his  mother; Hypertension in his father and mother. There is no history of Colon cancer, Esophageal cancer, Colon polyps, Rectal cancer, or Stomach cancer.  ROS:   Please see the history of present illness.    *** All other systems reviewed and are negative.  EKGs/Labs/Other Studies Reviewed:    The following studies were reviewed today: ***  EKG:  EKG is *** ordered today.  The ekg ordered today demonstrates ***  Recent Labs: 12/26/2021: B Natriuretic Peptide 1,868.7 12/28/2021: Magnesium 2.1 01/06/2022: ALT 19; BUN 11; Creatinine, Ser 0.98; Hemoglobin 14.0; Platelets 271; Potassium 6.0; Sodium 142  Recent Lipid Panel    Component Value Date/Time   CHOL 170 01/06/2022 1446   TRIG 50 01/06/2022 1446   HDL 78 01/06/2022 1446   CHOLHDL 2.2 01/06/2022 1446   LDLCALC 82 01/06/2022 1446    Physical Exam:    VS:  There were no  vitals taken for this visit.    Wt Readings from Last 3 Encounters:  01/06/22 148 lb 6 oz (67.3 kg)  12/29/21 136 lb 6.4 oz (61.9 kg)  12/26/21 166 lb (75.3 kg)     GEN: *** Well nourished, well developed in no acute distress HEENT: Normal NECK: No JVD; No carotid bruits LYMPHATICS: No lymphadenopathy CARDIAC: ***RRR, no murmurs, rubs, gallops RESPIRATORY:  Clear to auscultation without rales, wheezing or rhonchi  ABDOMEN: Soft, non-tender, non-distended MUSCULOSKELETAL:  No edema; No deformity  SKIN: Warm and dry NEUROLOGIC:  Alert and oriented x 3 PSYCHIATRIC:  Normal affect   ASSESSMENT:    No diagnosis found. PLAN:    Chronic diastolic heart failure:  Echocardiogram 12/27/2021 showed EF [id number], grade 1 diastolic dysfunction, normal RV function, no significant valvular disease. -Continue Lasix 20 mg daily  Hypertension: On losartan-HCTZ 50-12.5 mg daily  COPD:  RTC in***   Medication Adjustments/Labs and Tests Ordered: Current medicines are reviewed at length with the patient today.  Concerns regarding medicines are outlined above.  No orders of the defined types were placed in this encounter.  No orders of the defined types were placed in this encounter.   There are no Patient Instructions on file for this visit.   Signed, Donato Heinz, MD  01/23/2022 8:09 PM    [place]

## 2022-01-25 ENCOUNTER — Ambulatory Visit (AMBULATORY_SURGERY_CENTER): Payer: No Typology Code available for payment source | Admitting: *Deleted

## 2022-01-25 ENCOUNTER — Other Ambulatory Visit: Payer: Self-pay

## 2022-01-25 VITALS — Ht 71.0 in | Wt 140.0 lb

## 2022-01-25 DIAGNOSIS — Z1211 Encounter for screening for malignant neoplasm of colon: Secondary | ICD-10-CM

## 2022-01-25 NOTE — Progress Notes (Signed)
No egg or soy allergy known to patient  No issues known to pt with past sedation with any surgeries or procedures Patient denies ever being told they had issues or difficulty with intubation  No FH of Malignant Hyperthermia Pt is not on diet pills Pt is  on  home 02  Pt is not on blood thinners  Pt denies issues with constipation  No A fib or A flutter  Pt is not vaccinated  for Covid     Due to the COVID-19 pandemic we are asking patients to follow certain guidelines in PV and the [place]   Pt aware of COVID protocols and [facility] guidelines   PV completed over the phone. Pt verified name, DOB, address and insurance during PV today.  Pt mailed instruction packet with copy of consent form to read and not return, and instructions.  Pt encouraged to call with questions or issues.  If pt has My chart, procedure instructions sent via My Chart   Patient stated during pre-visit 'I have fluid backed on me and was having trouble breathing". Instructions not given OV made pt. On oxygen  @ 2L as of 12/29/21. OV 02/17/22

## 2022-01-26 ENCOUNTER — Ambulatory Visit: Payer: No Typology Code available for payment source | Admitting: Cardiology

## 2022-02-03 ENCOUNTER — Ambulatory Visit: Payer: No Typology Code available for payment source | Admitting: Nurse Practitioner

## 2022-02-08 ENCOUNTER — Encounter: Payer: No Typology Code available for payment source | Admitting: Gastroenterology

## 2022-02-09 ENCOUNTER — Encounter: Payer: Self-pay | Admitting: Internal Medicine

## 2022-02-09 ENCOUNTER — Other Ambulatory Visit: Payer: Self-pay

## 2022-02-09 ENCOUNTER — Ambulatory Visit (INDEPENDENT_AMBULATORY_CARE_PROVIDER_SITE_OTHER): Payer: No Typology Code available for payment source | Admitting: Internal Medicine

## 2022-02-09 VITALS — BP 156/96 | HR 96 | Ht 71.0 in | Wt 151.2 lb

## 2022-02-09 DIAGNOSIS — Z79899 Other long term (current) drug therapy: Secondary | ICD-10-CM

## 2022-02-09 DIAGNOSIS — I5030 Unspecified diastolic (congestive) heart failure: Secondary | ICD-10-CM

## 2022-02-09 MED ORDER — FUROSEMIDE 20 MG PO TABS: 40.0000 mg | ORAL_TABLET | Freq: Every day | ORAL | 3 refills | Status: DC

## 2022-02-09 NOTE — Progress Notes (Signed)
Cardiology Office Note:    Date:  02/09/2022   ID:  Sean Hull, DOB 1961/08/18, MRN [id number]  PCP:  Kathrynn Speed, NP   [facility] Providers Cardiologist:  Maisie Fus, MD     Referring MD: Kathrynn Speed, NP   No chief complaint on file. Shortness of breath  History of Present Illness:    Sean Hull is a 61 y.o. male with a hx HFpeF, of severe COPD, HTN, and substance abuse, recent hospitalization for decompensated CHF/ COPD exacerbation referral for hospital follow up  See DC summary for details 12/29/2021. Briefly p/w  LE edema and orthopnea. He's had a weight gain of 31 pounds. BNP 1868. He has imaging findings below.  Chest radiograph with hyperinflation with positive bibasilar atelectasis more left than right, positive hilar vascular congestion.    CT chest with no pulmonary embolism, diffuse emphysema, with 8-7 mm right lower lobe pulmonary nodule.    He was managed with IV lasix had total net negative 5L.  Last weight 2/22 140 pounds; 136 on discharge.  Wt Readings from Last 3 Encounters:  02/09/22 151 lb 3.2 oz (68.6 kg)  01/25/22 140 lb (63.5 kg)  01/06/22 148 lb 6 oz (67.3 kg)   He feels low energy. He feels no strength for about 6 months to 1 year. He has not had a dramatic improvement with this. He has noticed that from a fluid standpoint he has improved. He can lie flat.  He's 151 pounds today which is . He took lasix Sunday or Monday, then he stopped taking it. He did not think he was getting a good response.   Cardiology Studies  TTE-LVEF 50% RV was moderately enlarged. CT did not show PE. He did not have evidence of pulmonary HTN on echo.   EKG with 89 bpm, RVH  Past Medical History:  Diagnosis Date   Acute respiratory failure with hypoxia (HCC)    Anxiety    Arthritis    hands   CAP (community acquired pneumonia)    Cataract    bilateral-removed   COPD (chronic obstructive pulmonary disease) (HCC)    Dyspnea    with exertion    Emphysema of lung (HCC)    GERD (gastroesophageal reflux disease)    Hypertension    Oxygen deficiency    Pneumonia    Substance use disorder    pt.denies as of 01/25/22   Tobacco use     Past Surgical History:  Procedure Laterality Date   BACK SURGERY     Disectomy    cataracts Bilateral    CYST EXCISION     left side of face   ESOPHAGEAL DILATION     ROTATOR CUFF REPAIR Right     x2   UPPER GASTROINTESTINAL ENDOSCOPY     VIDEO BRONCHOSCOPY WITH ENDOBRONCHIAL ULTRASOUND N/A 02/12/2019   Procedure: VIDEO BRONCHOSCOPY WITH ENDOBRONCHIAL ULTRASOUND;  Surgeon: Leslye Peer, MD;  Location: [facility] OR;  Service: Thoracic;  Laterality: N/A;    Current Medications: Current Meds  Medication Sig   albuterol (VENTOLIN HFA) 108 (90 Base) MCG/ACT inhaler Inhale 2 puffs into the lungs every 6 (six) hours as needed for wheezing or shortness of breath.   Budeson-Glycopyrrol-Formoterol (BREZTRI AEROSPHERE) 160-9-4.8 MCG/ACT AERO Inhale 2 puffs into the lungs in the morning and at bedtime.   ipratropium-albuterol (DUONEB) 0.5-2.5 (3) MG/3ML SOLN Take 3 mLs by nebulization every 6 (six) hours as needed (shortness of breath or wheezing).   losartan-hydrochlorothiazide (HYZAAR)  50-12.5 MG tablet Take 1 tablet by mouth daily.   meloxicam (MOBIC) 7.5 MG tablet Take 1 tablet (7.5 mg total) by mouth daily.   nicotine polacrilex (NICOTINE MINI) 4 MG lozenge Take 1 lozenge (4 mg total) by mouth as needed for smoking cessation.   PROAIR HFA 108 (90 Base) MCG/ACT inhaler Inhale 1-2 puffs into the lungs every 6 (six) hours as needed for wheezing or shortness of breath.   [DISCONTINUED] furosemide (LASIX) 20 MG tablet Take 1 tablet (20 mg total) by mouth daily. Take twice daily in case of leg swelling or wight increase 3 lbs in 48 hrs or 5 lbs in 7 days.     Allergies:   Patient has no known allergies.   Social History   Socioeconomic History   Marital status: Widowed    Spouse name: Not on file    Number of children: Not on file   Years of education: Not on file   Highest education level: Not on file  Occupational History   Not on file  Tobacco Use   Smoking status: Every Day    Packs/day: 1.50    Years: 40.00    Pack years: 60.00    Types: Cigarettes    Passive exposure: Current   Smokeless tobacco: Never   Tobacco comments:    Smoking 1/2 ppd as of 12/26/2021 hfb  Vaping Use   Vaping Use: Never used  Substance and Sexual Activity   Alcohol use: Yes    Alcohol/week: 6.0 standard drinks    Types: 6 Cans of beer per week    Comment: per week   Drug use: Not Currently    Types: "Crack" cocaine    Comment: crack - last time  1 month or so ago- it was days before hospitalization in [place], 01/25/2019.   Sexual activity: Yes  Other Topics Concern   Not on file  Social History Narrative   Not on file   Social Determinants of Health   Financial Resource Strain: Not on file  Food Insecurity: Not on file  Transportation Needs: Not on file  Physical Activity: Not on file  Stress: Not on file  Social Connections: Not on file     Family History: The patient's family history includes Diabetes in his mother; Heart disease in his mother; Hypertension in his father and mother. There is no history of Colon cancer, Esophageal cancer, Colon polyps, Rectal cancer, or Stomach cancer.  ROS:   Please see the history of present illness.     All other systems reviewed and are negative.  EKGs/Labs/Other Studies Reviewed:    The following studies were reviewed today:   EKG:  EKG is  ordered today.  The ekg ordered today demonstrates   NSR, RVH  Recent Labs: 12/26/2021: B Natriuretic Peptide 1,868.7 12/28/2021: Magnesium 2.1 01/06/2022: ALT 19; BUN 11; Creatinine, Ser 0.98; Hemoglobin 14.0; Platelets 271; Potassium 6.0; Sodium 142  Recent Lipid Panel    Component Value Date/Time   CHOL 170 01/06/2022 1446   TRIG 50 01/06/2022 1446   HDL 78 01/06/2022 1446   CHOLHDL 2.2  01/06/2022 1446   LDLCALC 82 01/06/2022 1446     Risk Assessment/Calculations:     The 10-year ASCVD risk score (Arnett DK, et al., 2019) is: 14%   Values used to calculate the score:     Age: 28 years     Sex: Male     Is Non-Hispanic African American: No     Diabetic: No  Tobacco smoker: Yes     Systolic Blood Pressure: 156 mmHg     Is BP treated: Yes     HDL Cholesterol: 78 mg/dL     Total Cholesterol: 170 mg/dL       Physical Exam:    VS:   Vitals:   02/09/22 1303  BP: (!) 156/96  Pulse: 96  SpO2: 90%     Wt Readings from Last 3 Encounters:  02/09/22 151 lb 3.2 oz (68.6 kg)  01/25/22 140 lb (63.5 kg)  01/06/22 148 lb 6 oz (67.3 kg)     GEN: No acute distress HEENT: Normal NECK: No sig JVD LYMPHATICS: No lymphadenopathy CARDIAC: RRR, no murmurs, rubs, gallops RESPIRATORY:  mild wob, inspiratory rales, decreased air movement, no wheezing ABDOMEN: Soft, non-tender, non-distended MUSCULOSKELETAL:  2+ pitting edema at the ankles ; No deformity  SKIN: Warm and dry NEUROLOGIC:  Alert and oriented x 3 PSYCHIATRIC:  Normal affect   ASSESSMENT:    HFpeF: LV EF 50%, moderately enlarged RV. RV function was normal. No pulmonary htn. No large ASD. RV enlargement likely related to significant emphysema. He has gained weight and has pitting edema. Will plan to increase his lasix dose and arrange close follow up. - hypervolemic - increase lasix per below - up 15 pounds - dry weight 136-140  HLD- Will plan for statin initiation of follow up visit.   HTN- decompensated today. Will see how his pressures are after diuresis. Can continue current regimen. Can add spironolactone on follow up; will need to check a repeat K  PLAN:    In order of problems listed above:  Lasix 40 mg BID 5 days Lasix 40 mg daily afterwards  2 weeks BMP, Mg, BNP 1 month       Medication Adjustments/Labs and Tests Ordered: Current medicines are reviewed at length with the patient  today.  Concerns regarding medicines are outlined above.  Orders Placed This Encounter  Procedures   Basic metabolic panel   Magnesium   Brain natriuretic peptide   EKG 12-Lead   Meds ordered this encounter  Medications   furosemide (LASIX) 20 MG tablet    Sig: Take 2 tablets (40 mg total) by mouth daily. START: LASIX  TWICE DAILY for 5 DAYS on 02/09/22- THEN AFTER 5 DAYS DECREASE TO  ONCE DAILY    Dispense:  90 tablet    Refill:  3    Patient Instructions  Medication Instructions:   START: LASIX (FUROSEMIDE)  TWICE DAILY FOR 5 DAYS.   THEN: DECREASE TO  ONCE DAILY THEREAFTER  *If you need a refill on your cardiac medications before your next appointment, please call your pharmacy*  Lab Work: Please return for Blood Work in 2 WEEKS . No appointment needed, lab here at the office is open Monday-Friday from 8AM to 4PM and closed daily for lunch from 12:45-1:45.   If you have labs (blood work) drawn today and your tests are completely normal, you will receive your results only by: MyChart Message (if you have MyChart) OR A paper copy in the mail If you have any lab test that is abnormal or we need to change your treatment, we will call you to review the results.  Follow-Up: At [facility], you and your health needs are our priority.  As part of our continuing mission to provide you with exceptional heart care, we have created designated Provider Care Teams.  These Care Teams include your primary Cardiologist (physician) and Advanced Practice Providers (APPs -  Physician Assistants and Nurse Practitioners) who all work together to provide you with the care you need, when you need it.  Your next appointment:   1 month(s)  The format for your next appointment:   In Person  Provider:   Maisie FusBranch, Yasmin Bronaugh E, MD     Signed, Maisie FusBranch, Shayde Gervacio E, MD  02/09/2022 4:15 PM    [facility]

## 2022-02-09 NOTE — Patient Instructions (Signed)
Medication Instructions:  ? ?START: LASIX (FUROSEMIDE) 40mg  TWICE DAILY FOR 5 DAYS.  ? ?THEN: DECREASE TO 40mg  ONCE DAILY THEREAFTER ? ?*If you need a refill on your cardiac medications before your next appointment, please call your pharmacy* ? ?Lab Work: ?Please return for Blood Work in . No appointment needed, lab here at the office is open Monday-Friday from 8AM to 4PM and closed daily for lunch from 12:45-1:45.  ? ?If you have labs (blood work) drawn today and your tests are completely normal, you will receive your results only by: ?MyChart Message (if you have MyChart) OR ?A paper copy in the mail ?If you have any lab test that is abnormal or we need to change your treatment, we will call you to review the results. ? ?Follow-Up: ?At [facility], you and your health needs are our priority.  As part of our continuing mission to provide you with exceptional heart care, we have created designated Provider Care Teams.  These Care Teams include your primary Cardiologist (physician) and Advanced Practice Providers (APPs -  Physician Assistants and Nurse Practitioners) who all work together to provide you with the care you need, when you need it. ? ?Your next appointment:   ?1 month(s) ? ?The format for your next appointment:   ?In Person ? ?Provider:   ?[employer], MD   ?

## 2022-02-17 ENCOUNTER — Ambulatory Visit: Payer: No Typology Code available for payment source | Admitting: Gastroenterology

## 2022-02-20 ENCOUNTER — Encounter: Payer: Self-pay | Admitting: Nurse Practitioner

## 2022-02-20 ENCOUNTER — Ambulatory Visit (INDEPENDENT_AMBULATORY_CARE_PROVIDER_SITE_OTHER): Payer: No Typology Code available for payment source | Admitting: Nurse Practitioner

## 2022-02-20 ENCOUNTER — Other Ambulatory Visit: Payer: Self-pay

## 2022-02-20 VITALS — BP 171/92 | HR 109 | Temp 97.6°F | Ht 71.0 in | Wt 144.6 lb

## 2022-02-20 DIAGNOSIS — J441 Chronic obstructive pulmonary disease with (acute) exacerbation: Secondary | ICD-10-CM | POA: Diagnosis not present

## 2022-02-20 DIAGNOSIS — I5033 Acute on chronic diastolic (congestive) heart failure: Secondary | ICD-10-CM

## 2022-02-20 DIAGNOSIS — I10 Essential (primary) hypertension: Secondary | ICD-10-CM

## 2022-02-20 NOTE — Progress Notes (Signed)
? ?[facility] ?[address] ?[place], [place]  [address] ?Phone:  [phone]   Fax:  [phone] ?Subjective:  ? Patient ID: Sean MoronJames D Cutright, male    DOB: 08/21/1961, 61 y.o.   MRN: [id number] ? ?Chief Complaint  ?Patient presents with  ? Follow-up  ?  Pt stated he still has SOB and has no energy  ? ?HPI ?Sean Hull 61 y.o. male  has a past medical history of Acute respiratory failure with hypoxia (HCC), Anxiety, Arthritis, CAP (community acquired pneumonia), Cataract, COPD (chronic obstructive pulmonary disease) (HCC), Dyspnea, Emphysema of lung (HCC), GERD (gastroesophageal reflux disease), Hypertension, Oxygen deficiency, Pneumonia, Substance use disorder, and Tobacco use. [facility].  ? ?Patient states that he continues to have shortness of breath and increased fatigue. Shortness of breath is with activity and at rest. States that symptoms have been stable since being discharged from the hospital. Has reduced cigarette smoking to 1/2 PPD. States that he has been compliant with all medications, but has not been taking Hyzaar, was unaware he had refills. Did not take lasix this morning. Has been compliant with cardiology follow up appointments. Has been maintaining low sodium diet. Continues to be at home, does some activities during the day, but this is limited by shortness of breath. Denies any other concerns today. ? ?Denies any fever. Denies any fatigue, HA or dizziness. Denies any blurred vision, numbness or tingling. ?Past Medical History:  ?Diagnosis Date  ? Acute respiratory failure with hypoxia (HCC)   ? Anxiety   ? Arthritis   ? hands  ? CAP (community acquired pneumonia)   ? Cataract   ? bilateral-removed  ? COPD (chronic obstructive pulmonary disease) (HCC)   ? Dyspnea   ? with exertion  ? Emphysema of lung (HCC)   ? GERD (gastroesophageal reflux disease)   ? Hypertension   ? Oxygen deficiency   ? Pneumonia   ? Substance use disorder   ?  pt.denies as of 01/25/22  ? Tobacco use   ? ? ?Past Surgical History:  ?Procedure Laterality Date  ? BACK SURGERY    ? Disectomy   ? cataracts Bilateral   ? CYST EXCISION    ? left side of face  ? ESOPHAGEAL DILATION    ? ROTATOR CUFF REPAIR Right   ?  x2  ? UPPER GASTROINTESTINAL ENDOSCOPY    ? VIDEO BRONCHOSCOPY WITH ENDOBRONCHIAL ULTRASOUND N/A 02/12/2019  ? Procedure: VIDEO BRONCHOSCOPY WITH ENDOBRONCHIAL ULTRASOUND;  Surgeon: Leslye PeerByrum, Robert S, MD;  Location: [facility] OR;  Service: Thoracic;  Laterality: N/A;  ? ? ?Family History  ?Problem Relation Age of Onset  ? Diabetes Mother   ? Heart disease Mother   ? Hypertension Mother   ? Hypertension Father   ? Colon cancer Neg Hx   ? Esophageal cancer Neg Hx   ? Colon polyps Neg Hx   ? Rectal cancer Neg Hx   ? Stomach cancer Neg Hx   ? ? ?Social History  ? ?Socioeconomic History  ? Marital status: Widowed  ?  Spouse name: Not on file  ? Number of children: Not on file  ? Years of education: Not on file  ? Highest education level: Not on file  ?Occupational History  ? Not on file  ?Tobacco Use  ? Smoking status: Every Day  ?  Packs/day: 1.50  ?  Years: 40.00  ?  Pack years: 60.00  ?  Types: Cigarettes  ?  Passive exposure: Current  ? Smokeless tobacco: Never  ? Tobacco comments:  ?  Smoking 1/2 ppd as of 12/26/2021 hfb  ?Vaping Use  ? Vaping Use: Never used  ?Substance and Sexual Activity  ? Alcohol use: Yes  ?  Alcohol/week: 6.0 standard drinks  ?  Types: 6 Cans of beer per week  ?  Comment: per week  ? Drug use: Not Currently  ?  Types: "Crack" cocaine  ?  Comment: crack - last time  1 month or so ago- it was days before hospitalization in [place], 01/25/2019.  ? Sexual activity: Yes  ?Other Topics Concern  ? Not on file  ?Social History Narrative  ? Not on file  ? ?Social Determinants of Health  ? ?Financial Resource Strain: Not on file  ?Food Insecurity: Not on file  ?Transportation Needs: Not on file  ?Physical Activity: Not on file  ?Stress: Not on file  ?Social  Connections: Not on file  ?Intimate Partner Violence: Not on file  ? ? ?Outpatient Medications Prior to Visit  ?Medication Sig Dispense Refill  ? albuterol (VENTOLIN HFA) 108 (90 Base) MCG/ACT inhaler Inhale 2 puffs into the lungs every 6 (six) hours as needed for wheezing or shortness of breath. 18 g 3  ? Budeson-Glycopyrrol-Formoterol (BREZTRI AEROSPHERE) 160-9-4.8 MCG/ACT AERO Inhale 2 puffs into the lungs in the morning and at bedtime. 10.7 g 6  ? furosemide (LASIX) 20 MG tablet Take 2 tablets (40 mg total) by mouth daily. START: LASIX 40mg  TWICE DAILY for 5 DAYS on 02/09/22- THEN AFTER 5 DAYS DECREASE TO 40mg  ONCE DAILY 90 tablet 3  ? ipratropium-albuterol (DUONEB) 0.5-2.5 (3) MG/3ML SOLN Take 3 mLs by nebulization every 6 (six) hours as needed (shortness of breath or wheezing). 360 mL 3  ? losartan-hydrochlorothiazide (HYZAAR) 50-12.5 MG tablet Take 1 tablet by mouth daily. 30 tablet 2  ? meloxicam (MOBIC) 7.5 MG tablet Take 1 tablet (7.5 mg total) by mouth daily. 30 tablet 0  ? PROAIR HFA 108 (90 Base) MCG/ACT inhaler Inhale 1-2 puffs into the lungs every 6 (six) hours as needed for wheezing or shortness of breath.    ? nicotine polacrilex (NICOTINE MINI) 4 MG lozenge Take 1 lozenge (4 mg total) by mouth as needed for smoking cessation. (Patient not taking: Reported on 02/20/2022) 100 tablet 0  ? ?No facility-administered medications prior to visit.  ? ? ?No Known Allergies ? ?Review of Systems  ?Constitutional:  Negative for chills, fever and malaise/fatigue.  ?Respiratory:  Positive for shortness of breath. Negative for cough.   ?Cardiovascular:  Positive for chest pain. Negative for palpitations and leg swelling.  ?Gastrointestinal:  Negative for abdominal pain, blood in stool, constipation, diarrhea, nausea and vomiting.  ?Skin: Negative.   ?Neurological: Negative.   ?Psychiatric/Behavioral:  Negative for depression. The patient is not nervous/anxious.   ?All other systems reviewed and are negative. ? ?    ?Objective:  ?  ?Physical Exam ?Constitutional:   ?   General: He is not in acute distress. ?   Appearance: Normal appearance. He is normal weight.  ?HENT:  ?   Head: Normocephalic.  ?Cardiovascular:  ?   Rate and Rhythm: Normal rate and regular rhythm.  ?   Pulses: Normal pulses.  ?   Heart sounds: Normal heart sounds.  ?   Comments: No obvious peripheral edema ?Pulmonary:  ?   Effort: Pulmonary effort is normal. No respiratory distress.  ?   Breath sounds: No stridor. Rales present. No wheezing  or rhonchi.  ?Chest:  ?   Chest wall: No tenderness.  ?Musculoskeletal:     ?   General: No swelling, tenderness, deformity or signs of injury. Normal range of motion.  ?   Cervical back: Normal range of motion and neck supple.  ?   Right lower leg: No edema.  ?   Left lower leg: No edema.  ?Skin: ?   General: Skin is warm and dry.  ?   Capillary Refill: Capillary refill takes less than 2 seconds.  ?Neurological:  ?   General: No focal deficit present.  ?   Mental Status: He is alert and oriented to person, place, and time.  ?Psychiatric:     ?   Mood and Affect: Mood normal.     ?   Behavior: Behavior normal.     ?   Thought Content: Thought content normal.     ?   Judgment: Judgment normal.  ? ? ?BP (!) 171/92 (BP Location: Left Arm, Patient Position: Sitting, Cuff Size: Normal)   Pulse (!) 109   Temp 97.6 ?F (36.4 ?C)   Ht 5\' 11"  (1.803 m)   Wt 144 lb 9.6 oz (65.6 kg)   SpO2 90%   BMI 20.17 kg/m?  ?Wt Readings from Last 3 Encounters:  ?02/20/22 144 lb 9.6 oz (65.6 kg)  ?02/09/22 151 lb 3.2 oz (68.6 kg)  ?01/25/22 140 lb (63.5 kg)  ? ? ?Immunization History  ?Administered Date(s) Administered  ? Influenza,inj,Quad PF,6+ Mos 11/10/2019, 12/29/2021  ? Pneumococcal Polysaccharide-23 11/10/2019  ? ? ?Diabetic Foot Exam - Simple   ?No data filed ?  ? ? ?No results found for: TSH ?Lab Results  ?Component Value Date  ? WBC 5.8 01/06/2022  ? HGB 14.0 01/06/2022  ? HCT 42.6 01/06/2022  ? MCV 95 01/06/2022  ? PLT 271  01/06/2022  ? ?Lab Results  ?Component Value Date  ? NA 142 01/06/2022  ? K 6.0 (H) 01/06/2022  ? CO2 28 01/06/2022  ? GLUCOSE 106 (H) 01/06/2022  ? BUN 11 01/06/2022  ? CREATININE 0.98 01/06/2022  ? BILITOT 0.3 01/06/2022

## 2022-02-20 NOTE — Patient Instructions (Signed)
You were seen today in the [facility] for reevaluation of chronic illness. Labs were collected, results will be available via MyChart or, if abnormal, you will be contacted by clinic staff. You were prescribed medications, please take as directed. Please follow up in 1 mth for reevaluation.  ?

## 2022-02-21 LAB — COMPREHENSIVE METABOLIC PANEL
ALT: 9 IU/L (ref 0–44)
AST: 13 IU/L (ref 0–40)
Albumin/Globulin Ratio: 1.8 (ref 1.2–2.2)
Albumin: 4.2 g/dL (ref 3.8–4.9)
Alkaline Phosphatase: 94 IU/L (ref 44–121)
BUN/Creatinine Ratio: 18 (ref 10–24)
BUN: 20 mg/dL (ref 8–27)
Bilirubin Total: 0.6 mg/dL (ref 0.0–1.2)
CO2: 27 mmol/L (ref 20–29)
Calcium: 8.5 mg/dL — ABNORMAL LOW (ref 8.6–10.2)
Chloride: 102 mmol/L (ref 96–106)
Creatinine, Ser: 1.14 mg/dL (ref 0.76–1.27)
Globulin, Total: 2.4 g/dL (ref 1.5–4.5)
Glucose: 102 mg/dL — ABNORMAL HIGH (ref 70–99)
Potassium: 5.2 mmol/L (ref 3.5–5.2)
Sodium: 146 mmol/L — ABNORMAL HIGH (ref 134–144)
Total Protein: 6.6 g/dL (ref 6.0–8.5)
eGFR: 74 mL/min/{1.73_m2} (ref >59)

## 2022-03-07 ENCOUNTER — Ambulatory Visit: Payer: No Typology Code available for payment source | Admitting: Adult Health

## 2022-03-17 ENCOUNTER — Encounter: Payer: Self-pay | Admitting: Gastroenterology

## 2022-03-17 ENCOUNTER — Ambulatory Visit (INDEPENDENT_AMBULATORY_CARE_PROVIDER_SITE_OTHER): Payer: No Typology Code available for payment source | Admitting: Gastroenterology

## 2022-03-17 VITALS — BP 148/80 | HR 89 | Wt 135.4 lb

## 2022-03-17 DIAGNOSIS — R1319 Other dysphagia: Secondary | ICD-10-CM | POA: Diagnosis not present

## 2022-03-17 DIAGNOSIS — K219 Gastro-esophageal reflux disease without esophagitis: Secondary | ICD-10-CM

## 2022-03-17 MED ORDER — PANTOPRAZOLE SODIUM 40 MG PO TBEC: 40.0000 mg | DELAYED_RELEASE_TABLET | Freq: Every day | ORAL | 4 refills | Status: DC

## 2022-03-17 NOTE — Progress Notes (Signed)
? ? ?Chief Complaint: FU ? ?Referring Provider:  Orion Crook I, NP    ? ? ?ASSESSMENT AND PLAN;  ? ?#1.  GERD ? ?#2.  Comorbid conditions including severe COPD on O2 with continued smoking, HTN,  HFpeF.  Substance abuse  (cocaine) with mediastinal lymphadenopathy (neg LN Bx for malignancy via endobronchial ultrasound 02/2019). H/O Pseudomonas pneumonia. ? ?Plan: ?-Resume Protonix 40mg  po qd, 1/2hr before breakfast, #90, 4 refills. ?-Hold off on any endoscopic procedures d/t frail pulmonary status. ?-He will continue to FU with cardiology and pulmonary. ?-FU as needed. ?-D/W pt's sister and pt in detail. ? ?HPI:   ? ?Sean Hull is a 61 y.o. male  ?With HFpeF, Severe COPD on O2 with continued smoking, HTN, and substance abuse, recent hospitalization for decompensated CHF/ COPD Jan 2023. ? ?Here for GI FU ? ?He told me that he had EGD with dilatation by Dr. Feb 2023 last year 2022 when he was admitted to [facility].  His reflux was better when he was on Protonix.  He stopped for unknown reasons.  Then started having more heartburn and lately has been having intermittent dysphagia. ? ?He denies having any nausea, vomiting, odynophagia ? ?Denies having any diarrhea or constipation.  No abdominal pain. ? ?No blood in stools ? ?Past GI work-up: ?-In addition to above EGD (report awaited) ?-Refused colonoscopy in past.  Never had one. ? ?EGD 04/2019 ?-Candida esophagitis (biopsied). ?-Mild gastritis ?-S/p empiric esophageal dilatation. ? ?Ba swallow 04/2019 ?1. No esophageal stricture, mass, or mucosal abnormality identified. ?2. Large volume gastroesophageal reflux. ?3. Patent GE junction with barium tablet passing without difficulty. ? ?Past Medical History:  ?Diagnosis Date  ? Acute respiratory failure with hypoxia (HCC)   ? Anxiety   ? Arthritis   ? hands  ? CAP (community acquired pneumonia)   ? Cataract   ? bilateral-removed  ? COPD (chronic obstructive pulmonary disease) (HCC)   ? Dyspnea   ? with  exertion  ? Emphysema of lung (HCC)   ? GERD (gastroesophageal reflux disease)   ? Hypertension   ? Oxygen deficiency   ? Pneumonia   ? Substance use disorder   ? pt.denies as of 01/25/22  ? Tobacco use   ? ? ?Past Surgical History:  ?Procedure Laterality Date  ? BACK SURGERY    ? Disectomy   ? cataracts Bilateral   ? CYST EXCISION    ? left side of face  ? ESOPHAGEAL DILATION    ? ROTATOR CUFF REPAIR Right   ?  x2  ? UPPER GASTROINTESTINAL ENDOSCOPY    ? VIDEO BRONCHOSCOPY WITH ENDOBRONCHIAL ULTRASOUND N/A 02/12/2019  ? Procedure: VIDEO BRONCHOSCOPY WITH ENDOBRONCHIAL ULTRASOUND;  Surgeon: 04/14/2019, MD;  Location: [facility] OR;  Service: Thoracic;  Laterality: N/A;  ? ? ?Family History  ?Problem Relation Age of Onset  ? Diabetes Mother   ? Heart disease Mother   ? Hypertension Mother   ? Hypertension Father   ? Colon cancer Neg Hx   ? Esophageal cancer Neg Hx   ? Colon polyps Neg Hx   ? Rectal cancer Neg Hx   ? Stomach cancer Neg Hx   ? ? ?Social History  ? ?Tobacco Use  ? Smoking status: Every Day  ?  Packs/day: 1.50  ?  Years: 40.00  ?  Pack years: 60.00  ?  Types: Cigarettes  ?  Passive exposure: Current  ? Smokeless tobacco: Never  ? Tobacco comments:  ?  Smoking 1/2 ppd  as of 12/26/2021 hfb  ?Vaping Use  ? Vaping Use: Never used  ?Substance Use Topics  ? Alcohol use: Yes  ?  Alcohol/week: 6.0 standard drinks  ?  Types: 6 Cans of beer per week  ?  Comment: per week  ? Drug use: Not Currently  ?  Types: "Crack" cocaine  ?  Comment: crack - last time  1 month or so ago- it was days before hospitalization in [place], 01/25/2019.  ? ? ?Current Outpatient Medications  ?Medication Sig Dispense Refill  ? albuterol (VENTOLIN HFA) 108 (90 Base) MCG/ACT inhaler Inhale 2 puffs into the lungs every 6 (six) hours as needed for wheezing or shortness of breath. 18 g 3  ? Budeson-Glycopyrrol-Formoterol (BREZTRI AEROSPHERE) 160-9-4.8 MCG/ACT AERO Inhale 2 puffs into the lungs in the morning and at bedtime. 10.7 g 6  ? furosemide  (LASIX) 20 MG tablet Take 2 tablets (40 mg total) by mouth daily. START: LASIX 40mg  TWICE DAILY for 5 DAYS on 02/09/22- THEN AFTER 5 DAYS DECREASE TO 40mg  ONCE DAILY 90 tablet 3  ? ipratropium-albuterol (DUONEB) 0.5-2.5 (3) MG/3ML SOLN Take 3 mLs by nebulization every 6 (six) hours as needed (shortness of breath or wheezing). 360 mL 3  ? losartan-hydrochlorothiazide (HYZAAR) 50-12.5 MG tablet Take 1 tablet by mouth daily. 30 tablet 2  ? meloxicam (MOBIC) 7.5 MG tablet Take 1 tablet (7.5 mg total) by mouth daily. 30 tablet 0  ? nicotine polacrilex (NICOTINE MINI) 4 MG lozenge Take 1 lozenge (4 mg total) by mouth as needed for smoking cessation. 100 tablet 0  ? OXYGEN Inhale into the lungs. On 2 Lt    ? PROAIR HFA 108 (90 Base) MCG/ACT inhaler Inhale 1-2 puffs into the lungs every 6 (six) hours as needed for wheezing or shortness of breath.    ? ?No current facility-administered medications for this visit.  ? ? ?No Known Allergies ? ?Review of Systems:   ?Respiratory: Has SOB, DOE, cough, chest tightness,  and wheezing.  ? ?  ? ?Physical Exam:   ? ?BP (!) 148/80   Pulse 89   Wt 135 lb 6 oz (61.4 kg)   SpO2 91%   BMI 18.88 kg/m?  ?Filed Weights  ? 03/17/22 1321  ?Weight: 135 lb 6 oz (61.4 kg)  ? ?Gen: awake, alert, NAD ?HEENT: anicteric, no pallor ?CV: RRR, no mrg ?Pulm: Severely decreased breath sounds.  No definite wheezing. ?Abd: soft, NT/ND, +BS throughout ?Ext: no c/c/e ?Neuro: nonfocal ? ? ?Data Reviewed: I have personally reviewed following labs and imaging studies ? ?CBC: ? ?  Latest Ref Rng & Units 01/06/2022  ?  2:46 PM 12/27/2021  ?  3:30 AM 12/26/2021  ? 10:56 AM  ?CBC  ?WBC 3.4 - 10.8 x10E3/uL 5.8   5.7   5.6    ?Hemoglobin 13.0 - 17.7 g/dL 12/29/2021   12/28/2021   80.1    ?Hematocrit 37.5 - 51.0 % 42.6   43.4   46.8    ?Platelets 150 - 450 x10E3/uL 271   173   190    ? ? ?CMP: ? ?  Latest Ref Rng & Units 02/20/2022  ? 11:57 AM 01/06/2022  ?  2:46 PM 12/29/2021  ?  3:43 AM  ?CMP  ?Glucose 70 - 99 mg/dL 03/06/2022   12/31/2021   84     ?BUN 8 - 27 mg/dL 20   11   11     ?Creatinine 0.76 - 1.27 mg/dL 827   078  0.87    ?Sodium 134 - 144 mmol/L 146   142   143    ?Potassium 3.5 - 5.2 mmol/L 5.2   6.0   4.1    ?Chloride 96 - 106 mmol/L 102   102   96    ?CO2 20 - 29 mmol/L 27   28   40    ?Calcium 8.6 - 10.2 mg/dL 8.5   8.7   8.4    ?Total Protein 6.0 - 8.5 g/dL 6.6   6.6     ?Total Bilirubin 0.0 - 1.2 mg/dL 0.6   0.3     ?Alkaline Phos 44 - 121 IU/L 94   78     ?AST 0 - 40 IU/L 13   24     ?ALT 0 - 44 IU/L 9   19     ? ? ? ? ?Edman Circleaj Samella Lucchetti, MD 03/17/2022, 1:31 PM ? ?Cc: Orion CrookPassmore, Tewana I, NP ? ? ?

## 2022-03-17 NOTE — Patient Instructions (Signed)
If you are age 60 or older, your body mass index should be between 23-30. Your Body mass index is 18.88 kg/m?Marland Kitchen If this is out of the aforementioned range listed, please consider follow up with your Primary Care Provider. ? ?If you are age 30 or younger, your body mass index should be between 19-25. Your Body mass index is 18.88 kg/m?Marland Kitchen If this is out of the aformentioned range listed, please consider follow up with your Primary Care Provider.  ? ?________________________________________________________ ? ?The [facility] providers would like to encourage you to use [facility] to communicate with providers for non-urgent requests or questions.  Due to long hold times on the telephone, sending your provider a message by [facility] may be a faster and more efficient way to get a response.  Please allow 48 business hours for a response.  Please remember that this is for non-urgent requests.  ?_______________________________________________________ ? ?We have sent the following medications to your pharmacy for you to pick up at your convenience: ?Protonix ? ?Please call in 3 months to schedule an office visit. ? ?Thank you, ? ?Dr. Lynann Bologna ? ? ? ? ?We want to thank you for trusting [facility] with your care. All of our staff and providers value the relationships we have built with our patients, and it is an honor to care for you.  ? ?We are writing to let you know that [facility] will close on Apr 17, 2022, and we invite you to continue to see Dr. Edman Circle and Doristine Locks at the [facility] [place] office location. We are consolidating our serices at these [facility] practices to better provide care. Our office staff will work with you to ensure a seamless transition.  ? ?Doristine Locks, DO -Dr. Barron Alvine will be movig to [facility] at [address], [place], [place] [address], effective Apr 17, 2022.  Contact [phone] to schedule an  appointment with him.  ? ?Edman Circle, MD- Dr. Chales Abrahams will be movig to [facility] at [address] [place], [place] [address], effective Apr 17, 2022.  Contact [phone] to schedule an appointment with him.  ? ?Requesting Medical Records ?If you need to request your medical records, please follow the instructions below. Your medical records are confidential, and a copy can be transferred to another provider or released to you or another person you designate only with your permission. ? ?There are several ways to request your medical records: ?Requests for medical records can be submitted through our practice.   ?You can also request your records electronically, in your MyChart account by selecting the ?Request Health Records? tab.  ?If you need additional information on how to request records, please go to [email or web address], choose Patient Information, then select Request Medical Records. ?To make an appointment or if you have any questions about your health care needs, please contact our office at [phone] and one of our staff members will be glad to assist you. ?[facility] is committed to providing exceptional care for you and our community. Thank you for allowing [place] to serve your health care needs. ?Sincerely, ? ?Trixie Dredge, Director [facility] ?[facility] also offers convenient virtual care options. Sore throat? Sinus problems? Cold or flu symptoms? Get care from the comfort of home with [facility] Video Visits and e-Visits. Learn more about the non-emergency conditions treated and start your virtual visit at [email or web address] ? ?

## 2022-03-21 ENCOUNTER — Ambulatory Visit: Payer: No Typology Code available for payment source | Admitting: Internal Medicine

## 2022-03-24 ENCOUNTER — Telehealth: Payer: Self-pay | Admitting: Nurse Practitioner

## 2022-03-24 ENCOUNTER — Ambulatory Visit (INDEPENDENT_AMBULATORY_CARE_PROVIDER_SITE_OTHER): Payer: No Typology Code available for payment source

## 2022-03-24 ENCOUNTER — Ambulatory Visit (INDEPENDENT_AMBULATORY_CARE_PROVIDER_SITE_OTHER): Payer: No Typology Code available for payment source | Admitting: Nurse Practitioner

## 2022-03-24 ENCOUNTER — Encounter: Payer: Self-pay | Admitting: Nurse Practitioner

## 2022-03-24 VITALS — BP 144/82 | HR 107 | Ht 71.0 in | Wt 135.6 lb

## 2022-03-24 DIAGNOSIS — Z720 Tobacco use: Secondary | ICD-10-CM

## 2022-03-24 DIAGNOSIS — J9601 Acute respiratory failure with hypoxia: Secondary | ICD-10-CM | POA: Diagnosis not present

## 2022-03-24 DIAGNOSIS — J441 Chronic obstructive pulmonary disease with (acute) exacerbation: Secondary | ICD-10-CM

## 2022-03-24 MED ORDER — PREDNISONE 10 MG PO TABS: ORAL_TABLET | ORAL | 0 refills | Status: DC

## 2022-03-24 MED ORDER — ARFORMOTEROL TARTRATE 15 MCG/2ML IN NEBU: 15.0000 ug | INHALATION_SOLUTION | Freq: Two times a day (BID) | RESPIRATORY_TRACT | 3 refills | Status: DC

## 2022-03-24 MED ORDER — DOXYCYCLINE HYCLATE 100 MG PO TABS: 100.0000 mg | ORAL_TABLET | Freq: Two times a day (BID) | ORAL | 0 refills | Status: DC

## 2022-03-24 MED ORDER — ALBUTEROL SULFATE (2.5 MG/3ML) 0.083% IN NEBU: 2.5000 mg | INHALATION_SOLUTION | Freq: Four times a day (QID) | RESPIRATORY_TRACT | 12 refills | Status: DC | PRN

## 2022-03-24 MED ORDER — REVEFENACIN 175 MCG/3ML IN SOLN: 175.0000 ug | Freq: Every day | RESPIRATORY_TRACT | 3 refills | Status: DC

## 2022-03-24 MED ORDER — BUDESONIDE 0.5 MG/2ML IN SUSP: 0.5000 mg | Freq: Two times a day (BID) | RESPIRATORY_TRACT | 3 refills | Status: DC

## 2022-03-24 NOTE — Assessment & Plan Note (Signed)
Current everyday smoker.  Will discuss smoking cessation further at next visit.  Would be candidate for lung cancer screening program.  Will discuss at next visit ?

## 2022-03-24 NOTE — Telephone Encounter (Signed)
Printed OV notes and insurance card. Will fax to [facility].  ?

## 2022-03-24 NOTE — Progress Notes (Signed)
Please notify pt that his CXR did not show any pna. He does have some thickening, consistent with bronchitis. Please have him take prednisone and doxy as previously prescribed. Thanks!

## 2022-03-24 NOTE — Progress Notes (Signed)
Patient was called and notified. He voiced understanding and will continue to take medications as prescribed

## 2022-03-24 NOTE — Patient Instructions (Addendum)
Continue Breztri 2 puffs Twice daily until you get your nebulizer treatments. Brush tongue and rinse mouth afterwards. ?Continue Albuterol inhaler 2 puffs or 3 mL neb every 6 hours as needed for shortness of breath or wheezing. Notify if symptoms persist despite rescue inhaler/neb use. ?Continue supplemental oxygen 2 lpm for goal oxygen >88-90%.  ? ?Doxycycline 1 tab Twice daily for 7 days. Avoid direct exposure and wear sunscreen while taking ?Prednisone taper. 4 tabs for 2 days, then 3 tabs for 2 days, 2 tabs for 2 days, then 1 tab for 2 days, then stop. Take in AM with food.  ?Mucinex 600 mg Twice daily  ?Flutter valve 2-3 times a day after neb treatments  ? ?Start Yupelri neb once daily ?Start budesonide nebs Twice daily. Brush tongue and rinse mouth afterwards ?Start brovana nebs Twice daily  ? ?Chest x ray today. We will notify you of any abnormal results. ? ?Follow up in one week with Dr. Delton Coombes or Philis Nettle. If symptoms do not improve or worsen, please contact office for sooner follow up or seek emergency care. ?

## 2022-03-24 NOTE — Progress Notes (Signed)
? ?@Patient  ID: Sean Hull, male    DOB: 1961/04/27, 61 y.o.   MRN: [id number] ? ?Chief Complaint  ?Patient presents with  ? Follow-up  ?  SOB, coughing  ? ? ?Referring provider: ?Bo Merino I, NP ? ?HPI: ?61 year old male, current everyday smoker (60 pack years, 1.5 pack/day) followed for COPD with emphysema.  He is a patient of Dr. Agustina Caroli and last seen in office 12/26/2021 by Parrett,NP.  Past medical history significant for mediastinal lymphadenopathy, coronary artery calcification, hypertension, CHF, history of Pseudomonas infection. ? ?TEST/EVENTS:  ?12/26/2021 CTA chest: No evidence of PE.  There is mild diffuse thickening of the esophagus.  There is a left paratracheal node 1.1 cm.  Right hilar node 1.1 cm.  Both suspected to be reactive.  Clustered AP window lymph nodes.  Similar to prior.  There is biapical pleural-parenchymal scarring.  Prominent emphysema is present.  There is improved aeration in the right middle lobe, although a small amount of airspace opacity persists.  There is an 8 x 7 mm right lower lobe pulmonary nodule, slightly decreased in size from previous scan in 2020.  Some improvement of the adjacent branching nodularity in the left lower lobe previously seen.  There is upper abdominal ascites and mesenteric edema.  Substantial subcutaneous edema along the upper abdomen. ?12/27/2021 echocardiogram: EF 50%.  G1 DD.  Unable to measure PASP.  Trivial MR. ? ?12/26/2021: OV with Parrett,NP for acute visit.  Progressive swelling of his ankles and feet.  Also noticed worsening in his breathing.  Weight is up 36 pounds.  He was sent to the emergency department via EMS given concern for respiratory failure related to suspected acute exacerbation of heart failure. ? ?03/24/2022: Today-follow-up ?Patient presents today for overdue follow-up.  Since we saw him last, he reports that his swelling has been significantly better.  He continues on Lasix daily.  Has not had any further episodes of lower  extremity edema.  Weight is also stabilized.  He continues to have DOE, worse over the last couple days.  Reports that it has been progressive over the past month though.  Has an occasional cough with thick, white sputum production.  Notices an occasional wheeze.  Denies any recent hemoptysis, chest pain, palpitations, orthopnea, PND.  Has not been routinely monitoring his oxygen at home.  He does wear supplemental O2 2 L/min at night.  Continues on Breztri inhaler.  Feels like it works okay but does not work as well as his nebulizers.  Using his nebulizers multiple times a day. ? ?No Known Allergies ? ?Immunization History  ?Administered Date(s) Administered  ? Influenza,inj,Quad PF,6+ Mos 11/10/2019, 12/29/2021  ? Pneumococcal Polysaccharide-23 11/10/2019  ? ? ?Past Medical History:  ?Diagnosis Date  ? Acute respiratory failure with hypoxia ([facility])   ? Anxiety   ? Arthritis   ? hands  ? CAP (community acquired pneumonia)   ? Cataract   ? bilateral-removed  ? COPD (chronic obstructive pulmonary disease) ([place] [place])   ? Dyspnea   ? with exertion  ? Emphysema of lung ([place])   ? GERD (gastroesophageal reflux disease)   ? Hypertension   ? Oxygen deficiency   ? Pneumonia   ? Substance use disorder   ? pt.denies as of 01/25/22  ? Tobacco use   ? ? ?Tobacco History: ?Social History  ? ?Tobacco Use  ?Smoking Status Every Day  ? Packs/day: 1.50  ? Years: 40.00  ? Pack years: 60.00  ? Types: Cigarettes  ? Passive exposure:  Current  ?Smokeless Tobacco Never  ?Tobacco Comments  ? Smoking 1/2 ppd as of 12/26/2021 hfb  ? ?Ready to quit: Not Answered ?Counseling given: Not Answered ?Tobacco comments: Smoking 1/2 ppd as of 12/26/2021 hfb ? ? ?Outpatient Medications Prior to Visit  ?Medication Sig Dispense Refill  ? albuterol (VENTOLIN HFA) 108 (90 Base) MCG/ACT inhaler Inhale 2 puffs into the lungs every 6 (six) hours as needed for wheezing or shortness of breath. 18 g 3  ? furosemide (LASIX) 20 MG tablet Take 2 tablets (40 mg total) by mouth  daily. START: LASIX 40mg  TWICE DAILY for 5 DAYS on 02/09/22- THEN AFTER 5 DAYS DECREASE TO 40mg  ONCE DAILY 90 tablet 3  ? losartan-hydrochlorothiazide (HYZAAR) 50-12.5 MG tablet Take 1 tablet by mouth daily. 30 tablet 2  ? meloxicam (MOBIC) 7.5 MG tablet Take 1 tablet (7.5 mg total) by mouth daily. 30 tablet 0  ? nicotine polacrilex (NICOTINE MINI) 4 MG lozenge Take 1 lozenge (4 mg total) by mouth as needed for smoking cessation. 100 tablet 0  ? OXYGEN Inhale into the lungs. On 2 Lt    ? pantoprazole (PROTONIX) 40 MG tablet Take 1 tablet (40 mg total) by mouth daily. Take 30 minutes before breakfast 90 tablet 4  ? Budeson-Glycopyrrol-Formoterol (BREZTRI AEROSPHERE) 160-9-4.8 MCG/ACT AERO Inhale 2 puffs into the lungs in the morning and at bedtime. 10.7 g 6  ? ipratropium-albuterol (DUONEB) 0.5-2.5 (3) MG/3ML SOLN Take 3 mLs by nebulization every 6 (six) hours as needed (shortness of breath or wheezing). 360 mL 3  ? PROAIR HFA 108 (90 Base) MCG/ACT inhaler Inhale 1-2 puffs into the lungs every 6 (six) hours as needed for wheezing or shortness of breath.    ? ?No facility-administered medications prior to visit.  ? ? ? ?Review of Systems:  ? ?Constitutional: No weight loss or gain, night sweats, fevers, chills +fatigue, lassitude. ?HEENT: No headaches, difficulty swallowing, tooth/dental problems, or sore throat. No sneezing, itching, ear ache, nasal congestion, or post nasal drip ?CV:  No chest pain, orthopnea, PND, swelling in lower extremities, anasarca, dizziness, palpitations, syncope ?Resp: +shortness of breath with exertion; productive cough; occasional wheeze.   No hemoptysis.  No chest wall deformity ?GI:  No heartburn, indigestion, abdominal pain, nausea, vomiting, diarrhea, change in bowel habits, loss of appetite, bloody stools.  ?GU: No dysuria, change in color of urine, urgency or frequency.  No flank pain, no hematuria  ?Skin: No rash, lesions, ulcerations ?MSK:  No joint pain or swelling.  No decreased  range of motion.  No back pain. ?Neuro: No dizziness or lightheadedness.  ?Psych: No depression or anxiety. Mood stable.  ? ? ? ?Physical Exam: ? ?BP (!) 144/82 (BP Location: Right Arm, Cuff Size: Normal)   Pulse (!) 107   Ht 5\' 11"  (1.803 m)   Wt 135 lb 9.6 oz (61.5 kg)   SpO2 92%   BMI 18.91 kg/m?  ? ?GEN: Pleasant, interactive, chronically-ill appearing; in no acute distress. ?HEENT:  Normocephalic and atraumatic. PERRLA. Sclera white. Nasal turbinates pink, moist and patent bilaterally. No rhinorrhea present. Oropharynx pink and moist, without exudate or edema. No lesions, ulcerations, or postnasal drip.  ?NECK:  Supple w/ fair ROM. No JVD present. Normal carotid impulses w/o bruits. Thyroid symmetrical with no goiter or nodules palpated. No lymphadenopathy.   ?CV: RRR, no m/r/g, no peripheral edema. Pulses intact, +2 bilaterally. No cyanosis, pallor or clubbing. ?PULMONARY:  Unlabored, regular breathing.  Scattered rhonchi with expiratory wheeze bilaterally A&P. No accessory  muscle use. No dullness to percussion. ?GI: BS present and normoactive. Soft, non-tender to palpation.  ?MSK: No erythema, warmth or tenderness. Cap refil <2 sec all extrem. No deformities or joint swelling noted.  ?Neuro: A/Ox3. No focal deficits noted.   ?Skin: Warm, no lesions or rashe ?Psych: Normal affect and behavior. Judgement and thought content appropriate.  ? ? ? ?Lab Results: ? ?CBC ?   ?Component Value Date/Time  ? WBC 5.8 01/06/2022 1446  ? WBC 5.7 12/27/2021 0330  ? RBC 4.50 01/06/2022 1446  ? RBC 4.36 12/27/2021 0330  ? HGB 14.0 01/06/2022 1446  ? HCT 42.6 01/06/2022 1446  ? PLT 271 01/06/2022 1446  ? MCV 95 01/06/2022 1446  ? MCH 31.1 01/06/2022 1446  ? MCH 30.7 12/27/2021 0330  ? MCHC 32.9 01/06/2022 1446  ? MCHC 30.9 12/27/2021 0330  ? RDW 14.5 01/06/2022 1446  ? LYMPHSABS 1.1 01/06/2022 1446  ? MONOABS 0.5 12/26/2021 1056  ? EOSABS 0.0 01/06/2022 1446  ? BASOSABS 0.1 01/06/2022 1446  ? ? ?BMET ?   ?Component Value  Date/Time  ? NA 146 (H) 02/20/2022 1157  ? K 5.2 02/20/2022 1157  ? CL 102 02/20/2022 1157  ? CO2 27 02/20/2022 1157  ? GLUCOSE 102 (H) 02/20/2022 1157  ? GLUCOSE 84 12/29/2021 0343  ? BUN 20 02/20/2022 115

## 2022-03-24 NOTE — Assessment & Plan Note (Signed)
New oxygen requirement today. Oxygen is usually in the low 90s upon review of chart.  Patient was noted to be 87% upon arrival to exam room.  Recovered to 90s with 2 L/min.  Advised that he use supplemental 2 L/min continuously until we see him back in a week then we can reevaluate.  Goal oxygen greater than 88 to 90%.  Advised to seek emergency care if he is unable to maintain oxygen saturations despite O2 therapy or if his respiratory symptoms worsen. ?

## 2022-03-24 NOTE — Assessment & Plan Note (Addendum)
AECOPD.  Continues to experience high symptom burden.  We will switch to triple therapy nebs as he feels he gets better benefit from his nebulizer treatments and his inhaler.  Treat with prednisone taper and doxycycline course.  CXR today without superimposed infection.  There was evidence of bronchitis.  Close follow-up ? ?Patient Instructions  ?Continue Breztri 2 puffs Twice daily until you get your nebulizer treatments. Brush tongue and rinse mouth afterwards. ?Continue Albuterol inhaler 2 puffs or 3 mL neb every 6 hours as needed for shortness of breath or wheezing. Notify if symptoms persist despite rescue inhaler/neb use. ?Continue supplemental oxygen 2 lpm for goal oxygen >88-90%.  ? ?Doxycycline 1 tab Twice daily for 7 days. Avoid direct exposure and wear sunscreen while taking ?Prednisone taper. 4 tabs for 2 days, then 3 tabs for 2 days, 2 tabs for 2 days, then 1 tab for 2 days, then stop. Take in AM with food.  ?Mucinex 600 mg Twice daily  ?Flutter valve 2-3 times a day after neb treatments  ? ?Start Yupelri neb once daily ?Start budesonide nebs Twice daily. Brush tongue and rinse mouth afterwards ?Start brovana nebs Twice daily  ? ?Chest x ray today. We will notify you of any abnormal results. ? ?Follow up in one week with Dr. Delton Coombes or Philis Nettle. If symptoms do not improve or worsen, please contact office for sooner follow up or seek emergency care. ? ? ?

## 2022-03-27 ENCOUNTER — Ambulatory Visit: Payer: No Typology Code available for payment source | Admitting: Nurse Practitioner

## 2022-03-31 ENCOUNTER — Ambulatory Visit (INDEPENDENT_AMBULATORY_CARE_PROVIDER_SITE_OTHER): Payer: No Typology Code available for payment source | Admitting: Nurse Practitioner

## 2022-03-31 ENCOUNTER — Ambulatory Visit (INDEPENDENT_AMBULATORY_CARE_PROVIDER_SITE_OTHER): Payer: No Typology Code available for payment source

## 2022-03-31 ENCOUNTER — Encounter: Payer: Self-pay | Admitting: Nurse Practitioner

## 2022-03-31 VITALS — BP 120/76 | HR 113 | Temp 98.7°F | Ht 71.0 in | Wt 142.2 lb

## 2022-03-31 DIAGNOSIS — R000 Tachycardia, unspecified: Secondary | ICD-10-CM

## 2022-03-31 DIAGNOSIS — J9601 Acute respiratory failure with hypoxia: Secondary | ICD-10-CM

## 2022-03-31 DIAGNOSIS — I5032 Chronic diastolic (congestive) heart failure: Secondary | ICD-10-CM

## 2022-03-31 DIAGNOSIS — J441 Chronic obstructive pulmonary disease with (acute) exacerbation: Secondary | ICD-10-CM

## 2022-03-31 MED ORDER — METHYLPREDNISOLONE ACETATE 80 MG/ML IJ SUSP
80.0000 mg | Freq: Once | INTRAMUSCULAR | Status: AC
  Administered 2022-03-31: 80 mg via INTRAMUSCULAR

## 2022-03-31 MED ORDER — LEVOFLOXACIN 500 MG PO TABS: 500.0000 mg | ORAL_TABLET | Freq: Every day | ORAL | 0 refills | Status: AC

## 2022-03-31 MED ORDER — LEVOFLOXACIN 750 MG PO TABS: 750.0000 mg | ORAL_TABLET | Freq: Every day | ORAL | 0 refills | Status: DC

## 2022-03-31 MED ORDER — PREDNISONE 10 MG PO TABS
ORAL_TABLET | ORAL | 0 refills | Status: DC
Start: 2022-03-31 — End: 2022-04-27

## 2022-03-31 NOTE — Patient Instructions (Addendum)
You were strongly advised to go to the emergency department today. If you do not improve or your breathing worsens, please call 911 or go to the ED.  ? ?-Continue Breztri 2 puffs Twice daily until you get your nebulizer treatments. Brush tongue and rinse mouth afterwards. ?-Continue Albuterol inhaler 2 puffs or 3 mL neb every 6 hours as needed for shortness of breath or wheezing. Notify if symptoms persist despite rescue inhaler/neb use. ?-It is important that you keep your oxygen on at all times at 2 lpm. Monitor for goal oxygen >88-90%.  ?-Levaquin 500 mg daily for 7 days. Take with food. Stop and notify immediately if any tendon pain develops.  ?-Prednisone taper. 4 tabs for 3 days, then 3 tabs for 3 days, 2 tabs for 3 days, then 1 tab for 3 days, then stop. Take in AM with food. Start tomorrow.  ?-Continue Mucinex 600 mg Twice daily  ?-Continue Flutter valve 2-3 times a day after neb treatments  ? ?Once you receive your nebs, please stop Breztri and start...  ?Start Yupelri neb once daily ?Start budesonide nebs Twice daily. Brush tongue and rinse mouth afterwards ?Start brovana nebs Twice daily  ?  ?Chest x ray today.  ? ?Labs today - d dimer, BMET, CBC with diff ?  ?Follow up in one week with Dr. Delton Coombes or Philis Nettle. If symptoms do not improve or worsen, please contact office for sooner follow up or seek emergency care. ?

## 2022-03-31 NOTE — Assessment & Plan Note (Signed)
EKG obtained due to elevated heart rate.  Showed sinus tachycardia with right bundle branch block and possible right ventricular hypertrophy.  Obtaining D-dimer; which I suspect will likely be elevated.  Will obtain CTA or VQ scan depending on kidney function if positive. ?

## 2022-03-31 NOTE — Assessment & Plan Note (Signed)
Worsening hypoxemic respiratory failure.  Able to maintain saturations on 2 to 3 L/min.  Educated that he needs to wear his oxygen at all times.  Sent home with portable tank today.  Order sent to DME for portable oxygen.  Goal sats greater than 88 to 90%.  Advised that if he is dropping below this despite oxygen therapy he will need to go to the emergency department for further evaluation.  He verbalized understanding. ?

## 2022-03-31 NOTE — Assessment & Plan Note (Addendum)
Appears compensated on exam.  No evidence of fluid overload.  Continue Lasix as directed.  Follow-up with cardiology as scheduled. ?

## 2022-03-31 NOTE — Progress Notes (Deleted)
@Patient  ID: Sean Hull, male    DOB: 1961/06/12, 61 y.o.   MRN: [id number]  Chief Complaint  Patient presents with   Follow-up    Follow up. Patient says things are still the same.     Referring provider: Orion Crook I, NP  HPI:   TEST/EVENTS:   No Known Allergies  Immunization History  Administered Date(s) Administered   Influenza,inj,Quad PF,6+ Mos 11/10/2019, 12/29/2021   Influenza-Unspecified 11/10/2019   Pneumococcal Polysaccharide-23 11/10/2019    Past Medical History:  Diagnosis Date   Acute respiratory failure with hypoxia (HCC)    Anxiety    Arthritis    hands   CAP (community acquired pneumonia)    Cataract    bilateral-removed   COPD (chronic obstructive pulmonary disease) (HCC)    Dyspnea    with exertion   Emphysema of lung (HCC)    GERD (gastroesophageal reflux disease)    Hypertension    Oxygen deficiency    Pneumonia    Substance use disorder    pt.denies as of 01/25/22   Tobacco use     Tobacco History: Social History   Tobacco Use  Smoking Status Every Day   Packs/day: 1.50   Years: 40.00   Pack years: 60.00   Types: Cigarettes   Passive exposure: Current  Smokeless Tobacco Never  Tobacco Comments   Smoking 1/2 ppd as of 12/26/2021 hfb   Ready to quit: Not Answered Counseling given: Not Answered Tobacco comments: Smoking 1/2 ppd as of 12/26/2021 hfb   Outpatient Medications Prior to Visit  Medication Sig Dispense Refill   albuterol (PROVENTIL) (2.5 MG/3ML) 0.083% nebulizer solution Take 3 mLs (2.5 mg total) by nebulization every 6 (six) hours as needed for wheezing or shortness of breath. 75 mL 12   albuterol (VENTOLIN HFA) 108 (90 Base) MCG/ACT inhaler Inhale 2 puffs into the lungs every 6 (six) hours as needed for wheezing or shortness of breath. 18 g 3   arformoterol (BROVANA) 15 MCG/2ML NEBU Take 2 mLs (15 mcg total) by nebulization 2 (two) times daily. 360 mL 3   budesonide (PULMICORT) 0.5 MG/2ML nebulizer solution  Take 2 mLs (0.5 mg total) by nebulization 2 (two) times daily. 360 mL 3   doxycycline (VIBRA-TABS) 100 MG tablet Take 1 tablet (100 mg total) by mouth 2 (two) times daily. 14 tablet 0   furosemide (LASIX) 20 MG tablet Take 2 tablets (40 mg total) by mouth daily. START: LASIX 40mg  TWICE DAILY for 5 DAYS on 02/09/22- THEN AFTER 5 DAYS DECREASE TO 40mg  ONCE DAILY 90 tablet 3   losartan-hydrochlorothiazide (HYZAAR) 50-12.5 MG tablet Take 1 tablet by mouth daily. 30 tablet 2   meloxicam (MOBIC) 7.5 MG tablet Take 1 tablet (7.5 mg total) by mouth daily. 30 tablet 0   nicotine polacrilex (NICOTINE MINI) 4 MG lozenge Take 1 lozenge (4 mg total) by mouth as needed for smoking cessation. 100 tablet 0   OXYGEN Inhale into the lungs. On 2 Lt     pantoprazole (PROTONIX) 40 MG tablet Take 1 tablet (40 mg total) by mouth daily. Take 30 minutes before breakfast 90 tablet 4   predniSONE (DELTASONE) 10 MG tablet 4 tabs for 2 days, then 3 tabs for 2 days, 2 tabs for 2 days, then 1 tab for 2 days, then stop 20 tablet 0   revefenacin (YUPELRI) 175 MCG/3ML nebulizer solution Take 3 mLs (175 mcg total) by nebulization daily. 270 mL 3   No facility-administered medications prior to visit.  Review of Systems:   Constitutional: No weight loss or gain, night sweats, fevers, chills, fatigue, or lassitude. HEENT: No headaches, difficulty swallowing, tooth/dental problems, or sore throat. No sneezing, itching, ear ache, nasal congestion, or post nasal drip CV:  No chest pain, orthopnea, PND, swelling in lower extremities, anasarca, dizziness, palpitations, syncope Resp: No shortness of breath with exertion or at rest. No excess mucus or change in color of mucus. No productive or non-productive. No hemoptysis. No wheezing.  No chest wall deformity GI:  No heartburn, indigestion, abdominal pain, nausea, vomiting, diarrhea, change in bowel habits, loss of appetite, bloody stools.  GU: No dysuria, change in color of urine,  urgency or frequency.  No flank pain, no hematuria  Skin: No rash, lesions, ulcerations MSK:  No joint pain or swelling.  No decreased range of motion.  No back pain. Neuro: No dizziness or lightheadedness.  Psych: No depression or anxiety. Mood stable.     Physical Exam:  BP 120/76 (BP Location: Right Arm, Patient Position: Sitting, Cuff Size: Normal)   Pulse (!) 113   Temp 98.7 F (37.1 C) (Oral)   Ht 5\' 11"  (1.803 m)   Wt 142 lb 3.2 oz (64.5 kg)   SpO2 (!) 82%   BMI 19.83 kg/m   GEN: Pleasant, interactive, well-nourished/chronically-ill appearing/acutely-ill appearing/poorly-nourished/morbidly obese; in no acute distress.****** HEENT:  Normocephalic and atraumatic. EACs patent bilaterally. TM pearly gray with present light reflex bilaterally. PERRLA. Sclera white. Nasal turbinates pink, moist and patent bilaterally. No rhinorrhea present. Oropharynx pink and moist, without exudate or edema. No lesions, ulcerations, or postnasal drip.  NECK:  Supple w/ fair ROM. No JVD present. Normal carotid impulses w/o bruits. Thyroid symmetrical with no goiter or nodules palpated. No lymphadenopathy.   CV: RRR, no m/r/g, no peripheral edema. Pulses intact, +2 bilaterally. No cyanosis, pallor or clubbing. PULMONARY:  Unlabored, regular breathing. Clear bilaterally A&P w/o wheezes/rales/rhonchi. No accessory muscle use. No dullness to percussion. GI: BS present and normoactive. Soft, non-tender to palpation. No organomegaly or masses detected. No CVA tenderness. MSK: No erythema, warmth or tenderness. Cap refil <2 sec all extrem. No deformities or joint swelling noted.  Neuro: A/Ox3. No focal deficits noted.   Skin: Warm, no lesions or rashe Psych: Normal affect and behavior. Judgement and thought content appropriate.     Lab Results:  CBC    Component Value Date/Time   WBC 5.8 01/06/2022 1446   WBC 5.7 12/27/2021 0330   RBC 4.50 01/06/2022 1446   RBC 4.36 12/27/2021 0330   HGB 14.0  01/06/2022 1446   HCT 42.6 01/06/2022 1446   PLT 271 01/06/2022 1446   MCV 95 01/06/2022 1446   MCH 31.1 01/06/2022 1446   MCH 30.7 12/27/2021 0330   MCHC 32.9 01/06/2022 1446   MCHC 30.9 12/27/2021 0330   RDW 14.5 01/06/2022 1446   LYMPHSABS 1.1 01/06/2022 1446   MONOABS 0.5 12/26/2021 1056   EOSABS 0.0 01/06/2022 1446   BASOSABS 0.1 01/06/2022 1446    BMET    Component Value Date/Time   NA 146 (H) 02/20/2022 1157   K 5.2 02/20/2022 1157   CL 102 02/20/2022 1157   CO2 27 02/20/2022 1157   GLUCOSE 102 (H) 02/20/2022 1157   GLUCOSE 84 12/29/2021 0343   BUN 20 02/20/2022 1157   CREATININE 1.14 02/20/2022 1157   CALCIUM 8.5 (L) 02/20/2022 1157   GFRNONAA >60 12/29/2021 0343   GFRAA >60 02/10/2019 1138    BNP    Component Value  Date/Time   BNP 1,868.7 (H) 12/26/2021 1056     Imaging:  DG Chest 2 View  Result Date: 03/24/2022 CLINICAL DATA:  Productive cough, hypoxia EXAM: CHEST - 2 VIEW COMPARISON:  12/26/2021 FINDINGS: Cardiac size is within normal limits. There is peribronchial thickening. There is prominence of interstitial markings in both lungs, more so in the lower lung fields. There is no focal pulmonary consolidation. There is no pleural effusion or pneumothorax. Increase in AP diameter of chest suggests COPD. Old healed fracture is seen in the left clavicle. IMPRESSION: COPD. There is no focal pulmonary consolidation. Peribronchial thickening suggests bronchitis. There is prominence of interstitial markings in both lungs suggesting scarring or interstitial pneumonia. Electronically Signed   By: Ernie Avena M.D.   On: 03/24/2022 14:36          View : No data to display.          No results found for: NITRICOXIDE      Assessment & Plan:   No problem-specific Assessment & Plan notes found for this encounter.   I spent *** minutes of dedicated to the care of this patient on the date of this encounter to include pre-visit review of records,  face-to-face time with the patient discussing conditions above, post visit ordering of testing, clinical documentation with the electronic health record, making appropriate referrals as documented, and communicating necessary findings to members of the patients care team.  Noemi Chapel, NP 03/31/2022  Pt aware and understands NP's role.

## 2022-03-31 NOTE — Assessment & Plan Note (Signed)
Unresolving AECOPD.  Strongly advised patient to go to the emergency department; concern he will quickly decompensate at home.  Verbalized these concerns with him.  Patient refused to go to the emergency department.  Advised that I strongly disagree with this, but respect his decision.  Full work-up completed.  Depo injection x1.  Extended prednisone taper and Levaquin course.  CXR did not show any evidence of superimposed infection.  Will obtain completely CBC with diff, BMET, and d dimer. Will obtain CT imaging dependent on labs - did notify him that imaging would not be scheduled until probably Monday as it is Friday and I will not be able to get it scheduled over the weekend. Verbalized understanding and understood associated risks. ? ?Patient Instructions  ?You were strongly advised to go to the emergency department today. If you do not improve or your breathing worsens, please call 911 or go to the ED.  ? ?-Continue Breztri 2 puffs Twice daily until you get your nebulizer treatments. Brush tongue and rinse mouth afterwards. ?-Continue Albuterol inhaler 2 puffs or 3 mL neb every 6 hours as needed for shortness of breath or wheezing. Notify if symptoms persist despite rescue inhaler/neb use. ?-It is important that you keep your oxygen on at all times at 2 lpm. Monitor for goal oxygen >88-90%.  ?-Levaquin 500 mg daily for 7 days. Take with food. Stop and notify immediately if any tendon pain develops.  ?-Prednisone taper. 4 tabs for 3 days, then 3 tabs for 3 days, 2 tabs for 3 days, then 1 tab for 3 days, then stop. Take in AM with food. Start tomorrow.  ?-Continue Mucinex 600 mg Twice daily  ?-Continue Flutter valve 2-3 times a day after neb treatments  ? ?Once you receive your nebs, please stop Breztri and start...  ?Start Yupelri neb once daily ?Start budesonide nebs Twice daily. Brush tongue and rinse mouth afterwards ?Start brovana nebs Twice daily  ?  ?Chest x ray today.  ? ?Labs today - d dimer, BMET, CBC  with diff ?  ?Follow up in one week with Dr. Lamonte Sakai or Alanson Aly. If symptoms do not improve or worsen, please contact office for sooner follow up or seek emergency care. ? ? ?

## 2022-03-31 NOTE — Progress Notes (Signed)
? ?@Patient  ID: , male    DOB: 22-Jan-1961, 61 y.o.   MRN: [id number] ? ?Chief Complaint  ?Patient presents with  ? Follow-up  ?  Follow up. Patient says things are still the same.   ? ? ?Referring provider: ?[id number] I, NP ? ?HPI: ?61 year old male, current everyday smoker (60 pack years, 1.5 pack/day) followed for COPD with emphysema.  He is a patient of Dr. 67 and last seen in office 03/24/2022 by Parrett,NP.  Past medical history significant for mediastinal lymphadenopathy, coronary artery calcification, hypertension, CHF, history of Pseudomonas infection. ? ?TEST/EVENTS:  ?12/26/2021 CTA chest: No evidence of PE.  There is mild diffuse thickening of the esophagus.  There is a left paratracheal node 1.1 cm.  Right hilar node 1.1 cm.  Both suspected to be reactive.  Clustered AP window lymph nodes.  Similar to prior.  There is biapical pleural-parenchymal scarring.  Prominent emphysema is present.  There is improved aeration in the right middle lobe, although a small amount of airspace opacity persists.  There is an 8 x 7 mm right lower lobe pulmonary nodule, slightly decreased in size from previous scan in 2020.  Some improvement of the adjacent branching nodularity in the left lower lobe previously seen.  There is upper abdominal ascites and mesenteric edema.  Substantial subcutaneous edema along the upper abdomen. ?12/27/2021 echocardiogram: EF 50%.  G1 DD.  Unable to measure PASP.  Trivial MR. ? ?12/26/2021: OV with Parrett,NP for acute visit.  Progressive swelling of his ankles and feet.  Also noticed worsening in his breathing.  Weight is up 36 pounds.  He was sent to the emergency department via EMS given concern for respiratory failure related to suspected acute exacerbation of heart failure. ? ?03/24/2022: Patient presents today for overdue follow-up.  Since we saw him last, he reports that his swelling has been significantly better.  He continues on Lasix daily.  Has not had any  further episodes of lower extremity edema.  Weight is also stabilized.  He continues to have DOE, worse over the last couple days.  Reports that it has been progressive over the past month though.  Has an occasional cough with thick, white sputum production.  Notices an occasional wheeze.  Reported feeling like he did not feel like Breztri worked as well anymore.  Felt better after using nebulizers.  Transition to triple therapy nebs.  Advised to continue to use Breztri until he receives nebs in the mail.  Treated for AECOPD with prednisone taper and doxycycline course.  Advised mucociliary clearance therapies with Mucinex and flutter valve.  He was noted to be 87% on room air upon arrival to exam room; advised to use 2 L/min supplemental O2 which she had at home.  Declined being sent home with an O2 tank as he reported having some at home.  CXR showed bronchitic changes; no evidence of superimposed infection. ? ?03/31/2022: Today -follow-up ?Patient presented today for intended follow-up but upon arrival reported worsening symptoms.  He was ill-appearing with oxygen saturations in the low 80s on room air upon arrival to exam room.  Also was shaking and tachycardic into the 110's.  He reported that he completed the prednisone taper and doxycycline course yesterday.  Was feeling okay for few days and then began feeling worse over the past couple days.  Has not been wearing his oxygen consistently at home.  Does continue to wear it at night.  He does continue to have a cough, which is  productive (brown sputum) at times; however a lot of the times he feels like he cannot get it up and just has chest congestion.  Notices an occasional wheeze.  He denies any recent fevers, lower extremity swelling, nausea, palpitations, chest pain.  He continues on Breztri 2 puffs daily.  He continues to use his nebulizer as needed treatments a couple times a day.  He has yet to receive his triple therapy nebs from [facility] but was  notified that this should be coming soon.  He continues on Mucinex twice daily. ? ?No Known Allergies ? ?Immunization History  ?Administered Date(s) Administered  ? Influenza,inj,Quad PF,6+ Mos 11/10/2019, 12/29/2021  ? Influenza-Unspecified 11/10/2019  ? Pneumococcal Polysaccharide-23 11/10/2019  ? ? ?Past Medical History:  ?Diagnosis Date  ? Acute respiratory failure with hypoxia (HCC)   ? Anxiety   ? Arthritis   ? hands  ? CAP (community acquired pneumonia)   ? Cataract   ? bilateral-removed  ? COPD (chronic obstructive pulmonary disease) (HCC)   ? Dyspnea   ? with exertion  ? Emphysema of lung (HCC)   ? GERD (gastroesophageal reflux disease)   ? Hypertension   ? Oxygen deficiency   ? Pneumonia   ? Substance use disorder   ? pt.denies as of 01/25/22  ? Tobacco use   ? ? ?Tobacco History: ?Social History  ? ?Tobacco Use  ?Smoking Status Every Day  ? Packs/day: 1.50  ? Years: 40.00  ? Pack years: 60.00  ? Types: Cigarettes  ? Passive exposure: Current  ?Smokeless Tobacco Never  ?Tobacco Comments  ? Smoking 1/2 ppd as of 12/26/2021 hfb  ? ?Ready to quit: Not Answered ?Counseling given: Not Answered ?Tobacco comments: Smoking 1/2 ppd as of 12/26/2021 hfb ? ? ?Outpatient Medications Prior to Visit  ?Medication Sig Dispense Refill  ? albuterol (PROVENTIL) (2.5 MG/3ML) 0.083% nebulizer solution Take 3 mLs (2.5 mg total) by nebulization every 6 (six) hours as needed for wheezing or shortness of breath. 75 mL 12  ? albuterol (VENTOLIN HFA) 108 (90 Base) MCG/ACT inhaler Inhale 2 puffs into the lungs every 6 (six) hours as needed for wheezing or shortness of breath. 18 g 3  ? arformoterol (BROVANA) 15 MCG/2ML NEBU Take 2 mLs (15 mcg total) by nebulization 2 (two) times daily. 360 mL 3  ? budesonide (PULMICORT) 0.5 MG/2ML nebulizer solution Take 2 mLs (0.5 mg total) by nebulization 2 (two) times daily. 360 mL 3  ? doxycycline (VIBRA-TABS) 100 MG tablet Take 1 tablet (100 mg total) by mouth 2 (two) times daily. 14 tablet 0  ?  furosemide (LASIX) 20 MG tablet Take 2 tablets (40 mg total) by mouth daily. START: LASIX 40mg  TWICE DAILY for 5 DAYS on 02/09/22- THEN AFTER 5 DAYS DECREASE TO 40mg  ONCE DAILY 90 tablet 3  ? losartan-hydrochlorothiazide (HYZAAR) 50-12.5 MG tablet Take 1 tablet by mouth daily. 30 tablet 2  ? meloxicam (MOBIC) 7.5 MG tablet Take 1 tablet (7.5 mg total) by mouth daily. 30 tablet 0  ? nicotine polacrilex (NICOTINE MINI) 4 MG lozenge Take 1 lozenge (4 mg total) by mouth as needed for smoking cessation. 100 tablet 0  ? OXYGEN Inhale into the lungs. On 2 Lt    ? pantoprazole (PROTONIX) 40 MG tablet Take 1 tablet (40 mg total) by mouth daily. Take 30 minutes before breakfast 90 tablet 4  ? revefenacin (YUPELRI) 175 MCG/3ML nebulizer solution Take 3 mLs (175 mcg total) by nebulization daily. 270 mL 3  ? predniSONE (  DELTASONE) 10 MG tablet 4 tabs for 2 days, then 3 tabs for 2 days, 2 tabs for 2 days, then 1 tab for 2 days, then stop 20 tablet 0  ? ?No facility-administered medications prior to visit.  ? ? ? ?Review of Systems:  ? ?Constitutional: No weight loss or gain, night sweats, fevers, chills +fatigue, lassitude, anorexia. ?HEENT: No headaches, difficulty swallowing, tooth/dental problems, or sore throat. No sneezing, itching, ear ache, nasal congestion, or post nasal drip ?CV:  No chest pain, orthopnea, PND, swelling in lower extremities, anasarca, dizziness, palpitations, syncope ?Resp: +shortness of breath with exertion; productive cough; occasional wheeze.   No hemoptysis.  No chest wall deformity ?GI:  No heartburn, indigestion, abdominal pain, nausea, vomiting, diarrhea, change in bowel habits, loss of appetite, bloody stools.  ?GU: No dysuria, change in color of urine, urgency or frequency.  No flank pain, no hematuria  ?Skin: No rash, lesions, ulcerations ?MSK:  No joint pain or swelling.  No decreased range of motion.  No back pain. ?Neuro: No dizziness or lightheadedness.  ?Psych: No depression or anxiety. Mood  stable.  ? ? ? ?Physical Exam: ? ?BP 120/76 (BP Location: Right Arm, Patient Position: Sitting, Cuff Size: Normal)   Pulse (!) 113   Temp 98.7 ?F (37.1 ?C) (Oral)   Ht 5\' 11"  (1.803 m)   Wt 142 lb 3.2 oz (64.5

## 2022-04-07 ENCOUNTER — Ambulatory Visit: Payer: No Typology Code available for payment source | Admitting: Nurse Practitioner

## 2022-04-12 ENCOUNTER — Ambulatory Visit: Payer: No Typology Code available for payment source | Admitting: Nurse Practitioner

## 2022-04-17 ENCOUNTER — Ambulatory Visit (INDEPENDENT_AMBULATORY_CARE_PROVIDER_SITE_OTHER): Payer: No Typology Code available for payment source | Admitting: Nurse Practitioner

## 2022-04-17 ENCOUNTER — Encounter: Payer: Self-pay | Admitting: Nurse Practitioner

## 2022-04-17 VITALS — BP 144/82 | HR 123 | Ht 71.0 in | Wt 145.6 lb

## 2022-04-17 DIAGNOSIS — R591 Generalized enlarged lymph nodes: Secondary | ICD-10-CM | POA: Diagnosis not present

## 2022-04-17 DIAGNOSIS — Z720 Tobacco use: Secondary | ICD-10-CM

## 2022-04-17 DIAGNOSIS — J9611 Chronic respiratory failure with hypoxia: Secondary | ICD-10-CM

## 2022-04-17 DIAGNOSIS — R7989 Other specified abnormal findings of blood chemistry: Secondary | ICD-10-CM

## 2022-04-17 DIAGNOSIS — R000 Tachycardia, unspecified: Secondary | ICD-10-CM | POA: Diagnosis not present

## 2022-04-17 DIAGNOSIS — R300 Dysuria: Secondary | ICD-10-CM | POA: Diagnosis not present

## 2022-04-17 DIAGNOSIS — J441 Chronic obstructive pulmonary disease with (acute) exacerbation: Secondary | ICD-10-CM | POA: Diagnosis not present

## 2022-04-17 DIAGNOSIS — I5032 Chronic diastolic (congestive) heart failure: Secondary | ICD-10-CM | POA: Diagnosis not present

## 2022-04-17 DIAGNOSIS — J449 Chronic obstructive pulmonary disease, unspecified: Secondary | ICD-10-CM

## 2022-04-17 LAB — URINALYSIS, ROUTINE W REFLEX MICROSCOPIC
Hgb urine dipstick: NEGATIVE
Ketones, ur: NEGATIVE
Leukocytes,Ua: NEGATIVE
Nitrite: NEGATIVE
RBC / HPF: NONE SEEN (ref 0–?)
Specific Gravity, Urine: 1.02 (ref 1.000–1.030)
Total Protein, Urine: 100 — AB
Urine Glucose: NEGATIVE
Urobilinogen, UA: 1 (ref 0.0–1.0)
pH: 7 (ref 5.0–8.0)

## 2022-04-17 LAB — CBC WITH DIFFERENTIAL/PLATELET
Basophils Absolute: 0 10*3/uL (ref 0.0–0.1)
Basophils Relative: 0.2 % (ref 0.0–3.0)
Eosinophils Absolute: 0 10*3/uL (ref 0.0–0.7)
Eosinophils Relative: 0.2 % (ref 0.0–5.0)
HCT: 43.2 % (ref 39.0–52.0)
Hemoglobin: 13.5 g/dL (ref 13.0–17.0)
Lymphocytes Relative: 5.4 % — ABNORMAL LOW (ref 12.0–46.0)
Lymphs Abs: 0.6 10*3/uL — ABNORMAL LOW (ref 0.7–4.0)
MCHC: 31.2 g/dL (ref 30.0–36.0)
MCV: 97.1 fl (ref 78.0–100.0)
Monocytes Absolute: 0.6 10*3/uL (ref 0.1–1.0)
Monocytes Relative: 5.8 % (ref 3.0–12.0)
Neutro Abs: 9.3 10*3/uL — ABNORMAL HIGH (ref 1.4–7.7)
Neutrophils Relative %: 88.1 % — ABNORMAL HIGH (ref 43.0–77.0)
Platelets: 176 10*3/uL (ref 150.0–400.0)
RBC: 4.44 Mil/uL (ref 4.22–5.81)
RDW: 22.4 % — ABNORMAL HIGH (ref 11.5–15.5)
WBC: 10.5 10*3/uL (ref 4.0–10.5)

## 2022-04-17 LAB — D-DIMER, QUANTITATIVE: D-Dimer, Quant: 1.03 mcg/mL FEU — ABNORMAL HIGH (ref <0.50)

## 2022-04-17 NOTE — Assessment & Plan Note (Signed)
Slowly recovering from AECOPD.  Suspect residual DOE likely related to fluid overload.  Does have high symptom burden at baseline.  Advised that he contact [facility] to determine next steps to receive nebulizer treatments as we faxed all of the information they requested over per the most recent telephone note.  Encouraged to get started on these as soon as possible.  In interim, continue with Breztri and as needed albuterol. ? ?Patient Instructions  ?Continue Breztri 2 puffs Twice daily until you get your nebulizer treatments. Brush tongue and rinse mouth afterwards. ?Continue Albuterol inhaler 2 puffs or 3 mL neb every 6 hours as needed for shortness of breath or wheezing. Notify if symptoms persist despite rescue inhaler/neb use. ?Continue supplemental oxygen 2 lpm continuous for goal oxygen >88-90%.  ? ?Increase lasix to 80 mg for 5 days then decrease back to your 40 mg daily. Monitor your BP and notify if >100/60 ?Mucinex 600 mg Twice daily  ?Flutter valve 2-3 times a day after neb treatments  ? ?Call [facility]: [phone] ?Start Yupelri neb once daily ?Start budesonide nebs Twice daily. Brush tongue and rinse mouth afterwards ?Start brovana nebs Twice daily  ? ?Labs - BMET  ? ?Follow up in one week with Dr. Delton Coombes or Philis Nettle. If symptoms do not improve or worsen, please contact office for sooner follow up or seek emergency care ? ? ?

## 2022-04-17 NOTE — Assessment & Plan Note (Addendum)
Evident on CTA chest from end of January.  Suspected to be reactive.  Will order repeat imaging at follow-up next week to ensure resolution and no other underlying pulmonary process. ?

## 2022-04-17 NOTE — Assessment & Plan Note (Signed)
Evidence of fluid overload on exam.  Will check BMET today and increase Lasix to 40 mg twice daily for 5 days, then return to 40 mg daily.  Educated on importance of compliance with diuretic therapy.  Follow-up with cardiology ASAP. ?

## 2022-04-17 NOTE — Assessment & Plan Note (Signed)
Reports that he has not smoked since Friday.  Counseled to remain smoke-free.  ?

## 2022-04-17 NOTE — Progress Notes (Signed)
? ?@Patient  ID: , male    DOB: 1960-12-07, 61 y.o.   MRN: [id number] ? ?Chief Complaint  ?Patient presents with  ? Follow-up  ?  copd  ? ? ?Referring provider: ?[id number] I, NP ? ?HPI: ?61 year old male, current everyday smoker (60 pack years, 1.5 pack/day) followed for COPD with emphysema.  He is a patient of Dr. 67 and last seen in office 03/24/2022 by Parrett,NP.  Past medical history significant for mediastinal lymphadenopathy, coronary artery calcification, hypertension, CHF, history of Pseudomonas infection. ? ?TEST/EVENTS:  ?12/26/2021 CTA chest: No evidence of PE.  There is mild diffuse thickening of the esophagus.  There is a left paratracheal node 1.1 cm.  Right hilar node 1.1 cm.  Both suspected to be reactive.  Clustered AP window lymph nodes.  Similar to prior.  There is biapical pleural-parenchymal scarring.  Prominent emphysema is present.  There is improved aeration in the right middle lobe, although a small amount of airspace opacity persists.  There is an 8 x 7 mm right lower lobe pulmonary nodule, slightly decreased in size from previous scan in 2020.  Some improvement of the adjacent branching nodularity in the left lower lobe previously seen.  There is upper abdominal ascites and mesenteric edema.  Substantial subcutaneous edema along the upper abdomen. ?12/27/2021 echocardiogram: EF 50%.  G1 DD.  Unable to measure PASP.  Trivial MR. ? ?12/26/2021: OV with Parrett,NP for acute visit.  Progressive swelling of his ankles and feet.  Also noticed worsening in his breathing.  Weight is up 36 pounds.  He was sent to the emergency department via EMS given concern for respiratory failure related to suspected acute exacerbation of heart failure. ? ?03/24/2022: Patient presents today for overdue follow-up.  Since we saw him last, he reports that his swelling has been significantly better.  He continues on Lasix daily.  Has not had any further episodes of lower extremity edema.  Weight  is also stabilized.  He continues to have DOE, worse over the last couple days.  Reports that it has been progressive over the past month though.  Has an occasional cough with thick, white sputum production.  Notices an occasional wheeze.  Reported feeling like he did not feel like Breztri worked as well anymore.  Felt better after using nebulizers.  Transition to triple therapy nebs.  Advised to continue to use Breztri until he receives nebs in the mail.  Treated for AECOPD with prednisone taper and doxycycline course.  Advised mucociliary clearance therapies with Mucinex and flutter valve.  He was noted to be 87% on room air upon arrival to exam room; advised to use 2 L/min supplemental O2 which she had at home.  Declined being sent home with an O2 tank as he reported having some at home.  CXR showed bronchitic changes; no evidence of superimposed infection. ? ?03/31/2022: OV with Linwood Gullikson,NP for follow-up but upon arrival reported worsening symptoms.  He was ill-appearing with oxygen saturations in the low 80s on room air upon arrival to exam room.  Also was shaking and tachycardic into the 110's.  He reported that he completed the prednisone taper and doxycycline course yesterday.  Was feeling okay for few days and then began feeling worse over the past couple days.  Has not been wearing his oxygen consistently at home.  Does continue to wear it at night.  He does continue to have a cough, which is productive (brown sputum) at times; however a lot of the times he  feels like he cannot get it up and just has chest congestion.  Notices an occasional wheeze. Continued on Breztri - awaiting triple therapy nebs. Concern given unresolving/worsened AECOPD. Strongly advised to go to the ED but pt declined. Treated with Depo inj x 1, pred taper and levaquin course. CXR without evidence of superimposed infection. Ordered BMET, CBC with diff and d dimer - pt never got labs drawn. Strict ED precautions were advised - verbalized  understanding.  ? ?04/17/2022: Today - follow up ?Patient presents today with sister for follow-up after being treated for severe COPD exacerbation.  He reports feeling better.  Still has some shortness of breath with exertion but definitely feels closer to his baseline.  Cough is back to his baseline; primarily productive in AM.  He has noticed an increase in his lower extremity swelling.  He was not taking his Lasix but restarted it on Friday.  Has not noticed much of a difference with his swelling yet.  They also report that he has been having some discomfort with urination over the weekend.  He contacted his PCP who wanted to obtain a urinalysis; asked if he could go ahead and provide that sample today.  Denies any flank pain, nausea or vomiting.  He denies any orthopnea, chest pain, palpitations, fevers, hemoptysis, recent weight loss.  He did come to his appointment on room air and left his oxygen tank in the car.  He was noted to be 87% on arrival to exam room; recovered to 90s with 2 L/min.  Wants to know if he can get a smaller oxygen tank.  Has yet to receive triple therapy nebs from [facility].  Currently on Breztri and doing as needed albuterol a few times a day. ? ?No Known Allergies ? ?Immunization History  ?Administered Date(s) Administered  ? Influenza,inj,Quad PF,6+ Mos 11/10/2019, 12/29/2021  ? Influenza-Unspecified 11/10/2019  ? Pneumococcal Polysaccharide-23 11/10/2019  ? ? ?Past Medical History:  ?Diagnosis Date  ? Acute respiratory failure with hypoxia (HCC)   ? Anxiety   ? Arthritis   ? hands  ? CAP (community acquired pneumonia)   ? Cataract   ? bilateral-removed  ? COPD (chronic obstructive pulmonary disease) (HCC)   ? Dyspnea   ? with exertion  ? Emphysema of lung (HCC)   ? GERD (gastroesophageal reflux disease)   ? Hypertension   ? Oxygen deficiency   ? Pneumonia   ? Substance use disorder   ? pt.denies as of 01/25/22  ? Tobacco use   ? ? ?Tobacco History: ?Social History  ? ?Tobacco Use   ?Smoking Status Some Days  ? Packs/day: 1.50  ? Years: 40.00  ? Pack years: 60.00  ? Types: Cigarettes  ? Passive exposure: Current  ?Smokeless Tobacco Never  ?Tobacco Comments  ? Smoking 1/2 ppd as of 12/26/2021 hfb  ? ?Ready to quit: Not Answered ?Counseling given: Not Answered ?Tobacco comments: Smoking 1/2 ppd as of 12/26/2021 hfb ? ? ?Outpatient Medications Prior to Visit  ?Medication Sig Dispense Refill  ? albuterol (PROVENTIL) (2.5 MG/3ML) 0.083% nebulizer solution Take 3 mLs (2.5 mg total) by nebulization every 6 (six) hours as needed for wheezing or shortness of breath. 75 mL 12  ? albuterol (VENTOLIN HFA) 108 (90 Base) MCG/ACT inhaler Inhale 2 puffs into the lungs every 6 (six) hours as needed for wheezing or shortness of breath. 18 g 3  ? arformoterol (BROVANA) 15 MCG/2ML NEBU Take 2 mLs (15 mcg total) by nebulization 2 (two) times daily. 360  mL 3  ? budesonide (PULMICORT) 0.5 MG/2ML nebulizer solution Take 2 mLs (0.5 mg total) by nebulization 2 (two) times daily. 360 mL 3  ? nicotine polacrilex (NICOTINE MINI) 4 MG lozenge Take 1 lozenge (4 mg total) by mouth as needed for smoking cessation. 100 tablet 0  ? OXYGEN Inhale into the lungs. On 2 Lt    ? pantoprazole (PROTONIX) 40 MG tablet Take 1 tablet (40 mg total) by mouth daily. Take 30 minutes before breakfast 90 tablet 4  ? revefenacin (YUPELRI) 175 MCG/3ML nebulizer solution Take 3 mLs (175 mcg total) by nebulization daily. 270 mL 3  ? doxycycline (VIBRA-TABS) 100 MG tablet Take 1 tablet (100 mg total) by mouth 2 (two) times daily. 14 tablet 0  ? furosemide (LASIX) 20 MG tablet Take 2 tablets (40 mg total) by mouth daily. START: LASIX 40mg  TWICE DAILY for 5 DAYS on 02/09/22- THEN AFTER 5 DAYS DECREASE TO 40mg  ONCE DAILY (Patient not taking: Reported on 04/17/2022) 90 tablet 3  ? losartan-hydrochlorothiazide (HYZAAR) 50-12.5 MG tablet Take 1 tablet by mouth daily. 30 tablet 2  ? meloxicam (MOBIC) 7.5 MG tablet Take 1 tablet (7.5 mg total) by mouth daily.  (Patient not taking: Reported on 04/17/2022) 30 tablet 0  ? predniSONE (DELTASONE) 10 MG tablet 4 tabs for 3 days, then 3 tabs for 3 days, 2 tabs for 3 days, then 1 tab for 3 days, then stop (Patient not tak

## 2022-04-17 NOTE — Patient Instructions (Addendum)
Continue Breztri 2 puffs Twice daily until you get your nebulizer treatments. Brush tongue and rinse mouth afterwards. ?Continue Albuterol inhaler 2 puffs or 3 mL neb every 6 hours as needed for shortness of breath or wheezing. Notify if symptoms persist despite rescue inhaler/neb use. ?Continue supplemental oxygen 2 lpm continuous for goal oxygen >88-90%.  ? ?Increase lasix to 80 mg for 5 days then decrease back to your 40 mg daily. Monitor your BP and notify if >100/60 ?Mucinex 600 mg Twice daily  ?Flutter valve 2-3 times a day after neb treatments  ? ?Call [facility]: [phone] ?Start Yupelri neb once daily ?Start budesonide nebs Twice daily. Brush tongue and rinse mouth afterwards ?Start brovana nebs Twice daily  ? ?Labs - BMET  ? ?Follow up in one week with Dr. Lamonte Sakai or Alanson Aly. If symptoms do not improve or worsen, please contact office for sooner follow up or seek emergency care ?

## 2022-04-17 NOTE — Assessment & Plan Note (Signed)
Continues to have ongoing oxygen requirement.  Discussed the importance of wearing supplemental O2 with activity as the last couple visits he has been in the 80s on room air upon arrival to exam room.  He did bring his oxygen with him today and just left in the car.  Verbalized understanding.  Attempted to qualify for POC however he was unable to tolerate pulsed; maintain sats in the 90s on 2 L continuous.  Goal SPO2 greater than 88 to 90%. ?

## 2022-04-18 ENCOUNTER — Inpatient Hospital Stay: Admission: RE | Admit: 2022-04-18 | Payer: No Typology Code available for payment source | Source: Ambulatory Visit

## 2022-04-18 LAB — BASIC METABOLIC PANEL
BUN: 19 mg/dL (ref 6–23)
CO2: 30 mEq/L (ref 19–32)
Calcium: 8.5 mg/dL (ref 8.4–10.5)
Chloride: 95 mEq/L — ABNORMAL LOW (ref 96–112)
Creatinine, Ser: 0.74 mg/dL (ref 0.40–1.50)
GFR: 98.63 mL/min (ref >60.00)
Glucose, Bld: 99 mg/dL (ref 70–99)
Potassium: 4.1 mEq/L (ref 3.5–5.1)
Sodium: 142 mEq/L (ref 135–145)

## 2022-04-18 NOTE — Progress Notes (Signed)
Please notify patient that labs ordered at his previous visit were collected yesterday. His d dimer was elevated, likely related to acute heart failure exacerbation; however, could also mean there is a blood clot present. Because of this and his ongoing respiratory issues, I ordered a STAT CTA chest that he will need done either today or tomorrow. Please ensure he has increased his lasix as we discussed yesterday. Also, he had a high amount of protein in his urine, no evidence of UTI. Likely from his current illness. Would advise follow up with his PCP.

## 2022-04-18 NOTE — Addendum Note (Signed)
Addended by: Noemi Chapel on: 04/18/2022 08:42 AM ? ? Modules accepted: Orders ? ?

## 2022-04-19 ENCOUNTER — Ambulatory Visit (HOSPITAL_COMMUNITY)
Admission: RE | Admit: 2022-04-19 | Discharge: 2022-04-19 | Disposition: A | Discharge status: Home / Self Care | Payer: No Typology Code available for payment source | Source: Ambulatory Visit | Attending: Nurse Practitioner | Admitting: Nurse Practitioner

## 2022-04-19 DIAGNOSIS — R000 Tachycardia, unspecified: Secondary | ICD-10-CM | POA: Diagnosis present

## 2022-04-19 DIAGNOSIS — R7989 Other specified abnormal findings of blood chemistry: Secondary | ICD-10-CM | POA: Diagnosis present

## 2022-04-19 MED ORDER — IOHEXOL 350 MG/ML SOLN
150.0000 mL | Freq: Once | INTRAVENOUS | Status: AC | PRN
  Administered 2022-04-19: 140 mL via INTRAVENOUS

## 2022-04-19 NOTE — Progress Notes (Signed)
?  Evaluation after Contrast Extravasation ? ?Patient seen and examined immediately after contrast extravasation while in CT. ? ?Exam: ?There is moderate swelling at the [facility] area.  ?There is mild erythema. ?There is mild discoloration. ?There are no blisters. ?There are no signs of decreased perfusion of the skin.  ?It is warm to touch.  ?The patient has full ROM in fingers.  ?Radial pulse is normal. ? ?Per contrast extravasation protocol, I have instructed the patient to keep an ice pack on the area for 20-60 minutes at a time for about 48 hours. ?  ?Keep arm elevated as much as possible. ?  ?The patient understands to call the radiology department if there is: ?- increase in pain or swelling ?- changed or altered sensation ?- ulceration or blistering ?- increasing redness ?- warmth or increasing firmness ?- decreased tissue perfusion as noted by decreased capillary refill or discoloration of skin ?- decreased pulses peripheral to site ? ? ?Shon Hough, AGNP ?04/19/2022 ?3:01 PM ? ? ?  ?

## 2022-04-24 ENCOUNTER — Ambulatory Visit: Payer: No Typology Code available for payment source | Admitting: Nurse Practitioner

## 2022-04-25 NOTE — Progress Notes (Signed)
I was out of the office when results came in and looks like there was no call report made. Please notify patient he had evidence of multilobar pneumonia on his CTA. He was treated a few weeks ago with levaquin course so this could be residual that has not fully cleared on imaging. Please inquire as to how he is feeling and if his breathing/cough is stable compared to when I saw him on 5/15. If so, I will plan to see him 5/25, as previously scheduled. No evidence of PE. Thanks.

## 2022-04-27 ENCOUNTER — Ambulatory Visit (INDEPENDENT_AMBULATORY_CARE_PROVIDER_SITE_OTHER): Payer: No Typology Code available for payment source | Admitting: Nurse Practitioner

## 2022-04-27 ENCOUNTER — Encounter: Payer: Self-pay | Admitting: Nurse Practitioner

## 2022-04-27 VITALS — BP 122/84 | HR 111 | Temp 97.9°F | Ht 71.0 in | Wt 148.8 lb

## 2022-04-27 DIAGNOSIS — J189 Pneumonia, unspecified organism: Secondary | ICD-10-CM | POA: Insufficient documentation

## 2022-04-27 DIAGNOSIS — I5032 Chronic diastolic (congestive) heart failure: Secondary | ICD-10-CM

## 2022-04-27 DIAGNOSIS — Z720 Tobacco use: Secondary | ICD-10-CM

## 2022-04-27 DIAGNOSIS — J449 Chronic obstructive pulmonary disease, unspecified: Secondary | ICD-10-CM | POA: Diagnosis not present

## 2022-04-27 DIAGNOSIS — J441 Chronic obstructive pulmonary disease with (acute) exacerbation: Secondary | ICD-10-CM | POA: Diagnosis not present

## 2022-04-27 DIAGNOSIS — J9611 Chronic respiratory failure with hypoxia: Secondary | ICD-10-CM

## 2022-04-27 MED ORDER — ARFORMOTEROL TARTRATE 15 MCG/2ML IN NEBU: 15.0000 ug | INHALATION_SOLUTION | Freq: Two times a day (BID) | RESPIRATORY_TRACT | 3 refills | Status: DC

## 2022-04-27 MED ORDER — NICOTINE 21 MG/24HR TD PT24: 21.0000 mg | MEDICATED_PATCH | Freq: Every day | TRANSDERMAL | 0 refills | Status: AC

## 2022-04-27 MED ORDER — NICOTINE 14 MG/24HR TD PT24: 14.0000 mg | MEDICATED_PATCH | Freq: Every day | TRANSDERMAL | 0 refills | Status: AC

## 2022-04-27 MED ORDER — POTASSIUM CHLORIDE CRYS ER 20 MEQ PO TBCR: 20.0000 meq | EXTENDED_RELEASE_TABLET | Freq: Every day | ORAL | 0 refills | Status: DC

## 2022-04-27 MED ORDER — REVEFENACIN 175 MCG/3ML IN SOLN: 175.0000 ug | Freq: Every day | RESPIRATORY_TRACT | 3 refills | Status: DC

## 2022-04-27 MED ORDER — BUDESONIDE 0.5 MG/2ML IN SUSP: 0.5000 mg | Freq: Two times a day (BID) | RESPIRATORY_TRACT | 3 refills | Status: DC

## 2022-04-27 NOTE — Assessment & Plan Note (Signed)
Has cut down but been unable to quit entirely. Wants to move forward with nicotine patches. Taper sent today - 21 mg patch for 6 weeks then 14 mg patch for 2 weeks then 7 mg patch for 2 weeks then stop. Previous nodules were noted to be stable. Unable to fully assess if there were any new/suspicious nodules given the consolidation so will reassess in 5 weeks.

## 2022-04-27 NOTE — Assessment & Plan Note (Signed)
Stable on 2 lpm continuous. Previously tired on POC but unable to tolerate. If he maintains stability, we can rewalk him for POC qualification at his next visit. Goal >88-90%

## 2022-04-27 NOTE — Assessment & Plan Note (Addendum)
RLL consolidation with likely reactive LAD on recent CTA. Treated with levaquin prior to this visit. Advised imaging can lag clinical improvement. We will plan for repeat CT in 5 weeks to ensure clearing and rule out other underlying etiology.

## 2022-04-27 NOTE — Assessment & Plan Note (Signed)
Persistent BLE edema despite increased lasix for 5 days. Advised he increase to 40 mg Twice daily after we check BMET to ensure kidney function stable. Check BNP today. Add potassium 20 meq. Needs follow up with cardiology ASAP - sister plans to call them to set this up. I will also send them a message today to ensure they are aware.

## 2022-04-27 NOTE — Assessment & Plan Note (Signed)
Overall stable and improved from previous AECOPD/CAP. Would still like for him to transition to triple therapy nebs. Contacted [facility] who said that his insurance required the medicines be sent to a different pharmacy - sent to his local [facility] and advised him to notify if there are any cost concerns. Previously recommended pulm rehab but he declined.   Patient Instructions  Continue Breztri 2 puffs Twice daily until you get your nebulizer treatments. Brush tongue and rinse mouth afterwards. Continue Albuterol inhaler 2 puffs or 3 mL neb every 6 hours as needed for shortness of breath or wheezing. Notify if symptoms persist despite rescue inhaler/neb use. Continue supplemental oxygen 2 lpm continuous for goal oxygen >88-90%.  Continue Mucinex 600 mg Twice daily  Continue Flutter valve 2-3 times a day after neb treatments  Increase lasix to 40 mg Twice daily. Monitor your BP; goal >100/60. Notify of any dizziness or low blood pressure. Contact cardiology to schedule an appt ASAP. Potassium 20 meq daily while on increased dose of lasix  We sent your neb prescriptions to [facility] - [facility] stated that due to your insurance, they would need to go to a different pharmacy. Please let us know if you have any issues with cost or getting these.  -Start Yupelri neb once daily -Start budesonide nebs Twice daily. Brush tongue and rinse mouth afterwards -Start brovana nebs Twice daily   Labs today - BMET and BNP  Repeat CT chest with contrast in 5 weeks    Follow up in 6 weeks after CT scan with Dr. Delton Coombes. If symptoms do not improve or worsen, please contact office for sooner follow up or seek emergency care.

## 2022-04-27 NOTE — Patient Instructions (Addendum)
Continue Breztri 2 puffs Twice daily until you get your nebulizer treatments. Brush tongue and rinse mouth afterwards. Continue Albuterol inhaler 2 puffs or 3 mL neb every 6 hours as needed for shortness of breath or wheezing. Notify if symptoms persist despite rescue inhaler/neb use. Continue supplemental oxygen 2 lpm continuous for goal oxygen >88-90%.  Continue Mucinex 600 mg Twice daily  Continue Flutter valve 2-3 times a day after neb treatments  Increase lasix to 40 mg Twice daily. Monitor your BP; goal >100/60. Notify of any dizziness or low blood pressure. Contact cardiology to schedule an appt ASAP. Potassium 20 meq daily while on increased dose of lasix  We sent your neb prescriptions to [facility] - [facility] stated that due to your insurance, they would need to go to a different pharmacy. Please let us know if you have any issues with cost or getting these.  -Start Yupelri neb once daily -Start budesonide nebs Twice daily. Brush tongue and rinse mouth afterwards -Start brovana nebs Twice daily   Labs today - BMET and BNP  Repeat CT chest with contrast in 5 weeks    Follow up in 6 weeks after CT scan with Dr. Delton Coombes. If symptoms do not improve or worsen, please contact office for sooner follow up or seek emergency care.

## 2022-04-27 NOTE — Progress Notes (Signed)
  ID: Sean Hull, male    DOB: May 08, 1961, 61 y.o.   MRN: [id number]  Chief Complaint  Patient presents with   Follow-up    Pt states his breathing is not any better compared to last OV. Denies any complaints of wheezing and states he is not coughing anymore as well.    Referring provider: Orion Crook I, NP  HPI: 61 year old male, current everyday smoker (60 pack years, 1.5 pack/day) followed for COPD with emphysema.  He is a patient of Dr. Kavin Leech and last seen in office 03/24/2022 by Parrett,NP.  Past medical history significant for mediastinal lymphadenopathy, coronary artery calcification, hypertension, CHF, history of Pseudomonas infection.  TEST/EVENTS:  12/26/2021 CTA chest: No evidence of PE.  There is mild diffuse thickening of the esophagus.  There is a left paratracheal node 1.1 cm.  Right hilar node 1.1 cm.  Both suspected to be reactive.  Clustered AP window lymph nodes.  Similar to prior.  There is biapical pleural-parenchymal scarring.  Prominent emphysema is present.  There is improved aeration in the right middle lobe, although a small amount of airspace opacity persists.  There is an 8 x 7 mm right lower lobe pulmonary nodule, slightly decreased in size from previous scan in 2020.  Some improvement of the adjacent branching nodularity in the left lower lobe previously seen.  There is upper abdominal ascites and mesenteric edema.  Substantial subcutaneous edema along the upper abdomen. 12/27/2021 echocardiogram: EF 50%.  G1 DD.  Unable to measure PASP.  Trivial MR.  12/26/2021: OV with Parrett,NP for acute visit.  Progressive swelling of his ankles and feet.  Also noticed worsening in his breathing.  Weight is up 36 pounds.  He was sent to the emergency department via EMS given concern for respiratory failure related to suspected acute exacerbation of heart failure.  03/24/2022: Patient presents today for overdue follow-up.  Since we saw him last, he reports that his  swelling has been significantly better.  He continues on Lasix daily.  Has not had any further episodes of lower extremity edema.  Weight is also stabilized.  He continues to have DOE, worse over the last couple days.  Reports that it has been progressive over the past month though.  Has an occasional cough with thick, white sputum production.  Notices an occasional wheeze.  Reported feeling like he did not feel like Breztri worked as well anymore.  Felt better after using nebulizers.  Transition to triple therapy nebs.  Advised to continue to use Breztri until he receives nebs in the mail.  Treated for AECOPD with prednisone taper and doxycycline course.  Advised mucociliary clearance therapies with Mucinex and flutter valve.  He was noted to be 87% on room air upon arrival to exam room; advised to use 2 L/min supplemental O2 which she had at home.  Declined being sent home with an O2 tank as he reported having some at home.  CXR showed bronchitic changes; no evidence of superimposed infection.  03/31/2022: OV with Beauregard Jarrells,NP for follow-up but upon arrival reported worsening symptoms.  He was ill-appearing with oxygen saturations in the low 80s on room air upon arrival to exam room.  Also was shaking and tachycardic into the 110's.  He reported that he completed the prednisone taper and doxycycline course yesterday.  Was feeling okay for few days and then began feeling worse over the past couple days.  Has not been wearing his oxygen consistently at home.  Does continue to wear  it at night.  He does continue to have a cough, which is productive (brown sputum) at times; however a lot of the times he feels like he cannot get it up and just has chest congestion.  Notices an occasional wheeze. Continued on Breztri - awaiting triple therapy nebs. Concern given unresolving/worsened AECOPD. Strongly advised to go to the ED but pt declined. Treated with Depo inj x 1, pred taper and levaquin course. CXR without evidence of  superimposed infection. Ordered BMET, CBC with diff and d dimer - pt never got labs drawn. Strict ED precautions were advised - verbalized understanding.   04/17/2022: OV with Lataria Courser NP for overdue follow-up after being treated for severe COPD exacerbation, which she was recommended to go to the hospital for but declined.  He reports feeling better.  Still has some shortness of breath with exertion but definitely feels closer to his baseline.  Cough is back to his baseline; primarily productive in AM.  He has noticed an increase in his lower extremity swelling.  He was not taking his Lasix but restarted it on Friday.  Has not noticed much of a difference with his swelling yet.  They also report that he has been having some discomfort with urination over the weekend.  He contacted his PCP who wanted to obtain a urinalysis; asked if he could go ahead and provide that sample today.  He did come to his appointment on room air and left his oxygen tank in the car.  He was noted to be 87% on arrival to exam room; recovered to 90s with 2 L/min.  Wants to know if he can get a smaller oxygen tank -walked for POC qualification and was unable to maintain sats in greater than 88%.  Advised that we could recheck once his symptoms have improved.  Has yet to receive triple therapy nebs from [facility] -provided with number to contact them to determine the hold-up.  Currently on Breztri and doing as needed albuterol a few times a day.  BMET was checked with normal kidney function and urinalysis without evidence of UTI; he was instructed to increase his Lasix to 40 mg twice a day for 5 days then return to his baseline 40 mg daily - follow up with cardiology  04/27/2022: Today-follow-up Patient presents today with sister for follow-up.  During her last visit, labs ended up being drawn from previous encounter where he was having a severe COPD exacerbation which included a D-dimer.  The D-dimer ended up being positive so CTA with chest  was ordered for further evaluation.  He was noted to have a focal consolidation in the right middle lobe and interstitial thickening in the right lower lobe with increased LAD.  He completed levaquin course 2 weeks prior and was feeling better at the time of this imaging so no further antibiotics were given.   Today, he reports that his breathing is relatively stable; he does feel much better when compared to his visit 4/28. His cough is minimal and usually only productive in the AM. He does continue to have lower extremity edema; feels like the increase dosing of lasix helped some but quickly returned once he was back on 40 mg daily. He denies any fevers, night sweats, palpitations, wheezing, hemoptysis, weight loss. Continues on [place]; has yet to receive nebs. He is wearing supplemental oxygen today at 2 lpm and oxygen levels were stable. He continues to smoke but has cut back. Does want to try nicotine patches.  No Known Allergies  Immunization History  Administered Date(s) Administered   Influenza,inj,Quad PF,6+ Mos 11/10/2019, 12/29/2021   Influenza-Unspecified 11/10/2019   Pneumococcal Polysaccharide-23 11/10/2019    Past Medical History:  Diagnosis Date   Acute respiratory failure with hypoxia (HCC)    Anxiety    Arthritis    hands   CAP (community acquired pneumonia)    Cataract    bilateral-removed   COPD (chronic obstructive pulmonary disease) (HCC)    Dyspnea    with exertion   Emphysema of lung (HCC)    GERD (gastroesophageal reflux disease)    Hypertension    Oxygen deficiency    Pneumonia    Substance use disorder    pt.denies as of 01/25/22   Tobacco use     Tobacco History: Social History   Tobacco Use  Smoking Status Some Days   Packs/day: 1.50   Years: 40.00   Pack years: 60.00   Types: Cigarettes   Passive exposure: Current  Smokeless Tobacco Never  Tobacco Comments   Smoking 2cigs per day as of 04/27/22 ep   Ready to quit: Not Answered Counseling  given: Not Answered Tobacco comments: Smoking 2cigs per day as of 04/27/22 ep   Outpatient Medications Prior to Visit  Medication Sig Dispense Refill   albuterol (PROVENTIL) (2.5 MG/3ML) 0.083% nebulizer solution Take 3 mLs (2.5 mg total) by nebulization every 6 (six) hours as needed for wheezing or shortness of breath. 75 mL 12   albuterol (VENTOLIN HFA) 108 (90 Base) MCG/ACT inhaler Inhale 2 puffs into the lungs every 6 (six) hours as needed for wheezing or shortness of breath. 18 g 3   furosemide (LASIX) 20 MG tablet Take 2 tablets (40 mg total) by mouth daily. START: LASIX 40mg  TWICE DAILY for 5 DAYS on 02/09/22- THEN AFTER 5 DAYS DECREASE TO 40mg  ONCE DAILY (Patient not taking: Reported on 04/17/2022) 90 tablet 3   losartan-hydrochlorothiazide (HYZAAR) 50-12.5 MG tablet Take 1 tablet by mouth daily. 30 tablet 2   meloxicam (MOBIC) 7.5 MG tablet Take 1 tablet (7.5 mg total) by mouth daily. (Patient not taking: Reported on 04/17/2022) 30 tablet 0   nicotine polacrilex (NICOTINE MINI) 4 MG lozenge Take 1 lozenge (4 mg total) by mouth as needed for smoking cessation. 100 tablet 0   OXYGEN Inhale 3-4 L into the lungs.     pantoprazole (PROTONIX) 40 MG tablet Take 1 tablet (40 mg total) by mouth daily. Take 30 minutes before breakfast 90 tablet 4   arformoterol (BROVANA) 15 MCG/2ML NEBU Take 2 mLs (15 mcg total) by nebulization 2 (two) times daily. 360 mL 3   budesonide (PULMICORT) 0.5 MG/2ML nebulizer solution Take 2 mLs (0.5 mg total) by nebulization 2 (two) times daily. 360 mL 3   doxycycline (VIBRA-TABS) 100 MG tablet Take 1 tablet (100 mg total) by mouth 2 (two) times daily. 14 tablet 0   predniSONE (DELTASONE) 10 MG tablet 4 tabs for 3 days, then 3 tabs for 3 days, 2 tabs for 3 days, then 1 tab for 3 days, then stop (Patient not taking: Reported on 04/17/2022) 20 tablet 0   revefenacin (YUPELRI) 175 MCG/3ML nebulizer solution Take 3 mLs (175 mcg total) by nebulization daily. 270 mL 3   No  facility-administered medications prior to visit.     Review of Systems:   Constitutional: No weight loss or gain, night sweats, fevers, chills +fatigue (improved) HEENT: No headaches, difficulty swallowing, tooth/dental problems, or sore throat. No sneezing, itching, ear ache, nasal  congestion, or post nasal drip CV:  +swelling in lower extremities (unchanged). No chest pain, orthopnea, PND, anasarca, dizziness, palpitations, syncope Resp: +shortness of breath with exertion (improved); productive AM cough (baseline). No wheeze.   No hemoptysis.  No chest wall deformity GI:  No heartburn, indigestion, abdominal pain, nausea, vomiting, diarrhea, change in bowel habits, loss of appetite, bloody stools.  GU: No dysuria, change in color of urine, urgency or frequency.  No flank pain, no hematuria  Skin: No rash, lesions, ulcerations MSK:  No joint pain or swelling.  No decreased range of motion.  No back pain. Neuro: No dizziness or lightheadedness.  Psych: No depression or anxiety. Mood stable.     Physical Exam:  BP 122/84 (BP Location: Right Arm, Patient Position: Sitting, Cuff Size: Normal)   Pulse (!) 111   Temp 97.9 F (36.6 C) (Oral)   Ht 5\' 11"  (1.803 m)   Wt 148 lb 12.8 oz (67.5 kg)   SpO2 93% Comment: 3L cont  BMI 20.75 kg/m   GEN: Pleasant, interactive, chronically-ill appearing; in no acute distress. HEENT:  Normocephalic and atraumatic. PERRLA. Sclera white. Nasal turbinates pink, moist and patent bilaterally. No rhinorrhea present. Oropharynx pink and moist, without exudate or edema. No lesions, ulcerations, or postnasal drip.  NECK:  Supple w/ fair ROM. No JVD present. Normal carotid impulses w/o bruits. Thyroid symmetrical with no goiter or nodules palpated. No lymphadenopathy.   CV: RRR, no m/r/g, +2 pitting BLE edema. Pulses intact, +2 bilaterally. No cyanosis, pallor or clubbing. PULMONARY: Unlabored, regular. Diminished bases bilaterally, bilaterally A&P w/o  wheezes/rhonchi/rales. No accessory muscle use. No dullness to percussion. GI: BS present and normoactive. Soft, non-tender to palpation.  MSK: No erythema, warmth or tenderness. Cap refil <2 sec all extrem. No deformities or joint swelling noted.  Neuro: A/Ox3. No focal deficits noted.   Skin: Warm, no lesions or rashe Psych: Normal affect and behavior. Judgement and thought content appropriate.     Lab Results:  CBC    Component Value Date/Time   WBC 10.5 04/17/2022 1517   RBC 4.44 04/17/2022 1517   HGB 13.5 04/17/2022 1517   HGB 14.0 01/06/2022 1446   HCT 43.2 04/17/2022 1517   HCT 42.6 01/06/2022 1446   PLT 176.0 04/17/2022 1517   PLT 271 01/06/2022 1446   MCV 97.1 04/17/2022 1517   MCV 95 01/06/2022 1446   MCH 31.1 01/06/2022 1446   MCH 30.7 12/27/2021 0330   MCHC 31.2 04/17/2022 1517   RDW 22.4 (H) 04/17/2022 1517   RDW 14.5 01/06/2022 1446   LYMPHSABS 0.6 (L) 04/17/2022 1517   LYMPHSABS 1.1 01/06/2022 1446   MONOABS 0.6 04/17/2022 1517   EOSABS 0.0 04/17/2022 1517   EOSABS 0.0 01/06/2022 1446   BASOSABS 0.0 04/17/2022 1517   BASOSABS 0.1 01/06/2022 1446    BMET    Component Value Date/Time   NA 142 04/17/2022 1517   NA 146 (H) 02/20/2022 1157   K 4.1 04/17/2022 1517   CL 95 (L) 04/17/2022 1517   CO2 30 04/17/2022 1517   GLUCOSE 99 04/17/2022 1517   BUN 19 04/17/2022 1517   BUN 20 02/20/2022 1157   CREATININE 0.74 04/17/2022 1517   CALCIUM 8.5 04/17/2022 1517   GFRNONAA >60 12/29/2021 0343   GFRAA >60 02/10/2019 1138    BNP    Component Value Date/Time   BNP 1,868.7 (H) 12/26/2021 1056     Imaging:  DG Chest 2 View  Result Date: 03/31/2022 CLINICAL DATA:  Shortness of breath. Productive cough. COPD exacerbation. EXAM: CHEST - 2 VIEW COMPARISON:  Chest two views 03/24/2022, 12/26/2021, 08/13/2021; CT chest FINDINGS: Cardiac silhouette and mediastinal contours are within normal limits. There is again flattening of the diaphragms and moderate  hyperinflation. Diffuse bilateral mild interstitial thickening is mildly decreased from 03/24/2022 but increased from 12/26/2021. No pleural effusion or pneumothorax. Mild multilevel degenerative disc changes of the thoracic spine. Mild displaced old healed left clavicle fracture. IMPRESSION: Chronic hyperinflation consistent with COPD. Mild interstitial thickening is improved from 03/24/2022 but increased from 12/26/2021 and may represent mild interstitial pulmonary edema. Electronically Signed   By: Neita Garnet M.D.   On: 03/31/2022 15:34   CT Angio Chest Pulmonary Embolism (PE) W or WO Contrast  Result Date: 04/19/2022 CLINICAL DATA:  Positive D-dimer.  Concern for pulmonary embolism. EXAM: CT ANGIOGRAPHY CHEST WITH CONTRAST TECHNIQUE: Multidetector CT imaging of the chest was performed using the standard protocol during bolus administration of intravenous contrast. Multiplanar CT image reconstructions and MIPs were obtained to evaluate the vascular anatomy. RADIATION DOSE REDUCTION: This exam was performed according to the departmental dose-optimization program which includes automated exposure control, adjustment of the mA and/or kV according to patient size and/or use of iterative reconstruction technique. CONTRAST:  OMNIPAQUE IOHEXOL 350 MG/ML SOLN COMPARISON:  CT 1 23 23  FINDINGS: Cardiovascular: No filling defects within the pulmonary arteries to suggest acute pulmonary embolism. Mediastinum/Nodes: There is mild esophageal wall thickening throughout the course of the esophagus similar to prior. Subcarinal lymph node measures 18 mm compared to 12 mm. RIGHT hilar node measures 12 mm similar prior. RIGHT infrahilar peribronchial thickening to 12 mm (image 95/5) is also similar. Lungs/Pleura: There is a focus consolidation in the RIGHT middle lobe (image 129/9). Interstitial thickening in the RIGHT lower lobe to a focus of consolidation of the diaphragm. Severe centrilobular emphysema the upper  lobes. Interstitial thickening also present in the LEFT lower lobe 10 mm nodule in the RIGHT lower lobe (image 113/9) compared to 8 mm on prior. No change from more remote scan from 04/07/2020 Upper Abdomen: Limited view of the liver, kidneys, pancreas are unremarkable. Normal adrenal glands. Musculoskeletal: No aggressive osseous lesion. Review of the MIP images confirms the above findings. IMPRESSION: 1. No evidence acute pulmonary embolism. 2. Consolidation in the inferior RIGHT middle lobe and RIGHT lower lobe consistent multi lobar pneumonia versus aspiration pneumonitis. 3. Thickening of the through the esophagus suggests esophagitis. Consider endoscopy if patient has symptoms of dysphagia. 4. Mild mediastinal and RIGHT hilar adenopathy similar to comparison CT. 5. RIGHT lower lobe nodule stable over 2 year interval Electronically Signed   By: 06/07/2020 M.D.   On: 04/19/2022 15:39    methylPREDNISolone acetate (DEPO-MEDROL) injection 80 mg     Date Action Dose Route User   03/31/2022 1540 Given 80 mg Intramuscular (Left Ventrogluteal) 04/02/2022, RN           View : No data to display.          No results found for: NITRICOXIDE      Assessment & Plan:   COPD, very severe (HCC) Overall stable and improved from previous AECOPD/CAP. Would still like for him to transition to triple therapy nebs. Contacted [facility] who said that his insurance required the medicines be sent to a different pharmacy - sent to his local [facility] and advised him to notify if there are any cost concerns. Previously recommended pulm rehab but he declined.   Patient Instructions  Continue Breztri 2 puffs Twice daily until you get your nebulizer treatments. Brush tongue and rinse mouth afterwards. Continue Albuterol inhaler 2 puffs or 3 mL neb every 6 hours as needed for shortness of breath or wheezing. Notify if symptoms persist despite rescue inhaler/neb use. Continue supplemental oxygen 2 lpm  continuous for goal oxygen >88-90%.  Continue Mucinex 600 mg Twice daily  Continue Flutter valve 2-3 times a day after neb treatments  Increase lasix to 40 mg Twice daily. Monitor your BP; goal >100/60. Notify of any dizziness or low blood pressure. Contact cardiology to schedule an appt ASAP. Potassium 20 meq daily while on increased dose of lasix  We sent your neb prescriptions to [facility] - [facility] stated that due to your insurance, they would need to go to a different pharmacy. Please let us know if you have any issues with cost or getting these.  -Start Yupelri neb once daily -Start budesonide nebs Twice daily. Brush tongue and rinse mouth afterwards -Start brovana nebs Twice daily   Labs today - BMET and BNP  Repeat CT chest with contrast in 5 weeks    Follow up in 6 weeks after CT scan with Dr. Delton Coombes. If symptoms do not improve or worsen, please contact office for sooner follow up or seek emergency care.     Chronic respiratory failure with hypoxia (HCC) Stable on 2 lpm continuous. Previously tired on POC but unable to tolerate. If he maintains stability, we can rewalk him for POC qualification at his next visit. Goal >88-90%  CAP (community acquired pneumonia) RLL consolidation with likely reactive LAD on recent CTA. Treated with levaquin prior to this visit. Advised imaging can lag clinical improvement. We will plan for repeat CT in 5 weeks to ensure clearing and rule out other underlying etiology.   Tobacco abuse Has cut down but been unable to quit entirely. Wants to move forward with nicotine patches. Taper sent today - 21 mg patch for 6 weeks then 14 mg patch for 2 weeks then 7 mg patch for 2 weeks then stop. Previous nodules were noted to be stable. Unable to fully assess if there were any new/suspicious nodules given the consolidation so will reassess in 5 weeks.   CHF (congestive heart failure) (HCC) Persistent BLE edema despite increased lasix for 5 days. Advised he  increase to 40 mg Twice daily after we check BMET to ensure kidney function stable. Check BNP today. Add potassium 20 meq. Needs follow up with cardiology ASAP - sister plans to call them to set this up. I will also send them a message today to ensure they are aware.    I spent 38 minutes of dedicated to the care of this patient on the date of this encounter to include pre-visit review of records, face-to-face time with the patient discussing conditions above, post visit ordering of testing, clinical documentation with the electronic health record, making appropriate referrals as documented, and communicating necessary findings to members of the patients care team.  Noemi Chapel, NP 04/27/2022  Pt aware and understands NP's role.

## 2022-04-28 ENCOUNTER — Telehealth: Payer: Self-pay | Admitting: Internal Medicine

## 2022-04-28 LAB — BASIC METABOLIC PANEL
BUN: 13 mg/dL (ref 6–23)
CO2: 45 mEq/L — ABNORMAL HIGH (ref 19–32)
Calcium: 8.2 mg/dL — ABNORMAL LOW (ref 8.4–10.5)
Chloride: 93 mEq/L — ABNORMAL LOW (ref 96–112)
Creatinine, Ser: 0.87 mg/dL (ref 0.40–1.50)
GFR: 93.9 mL/min (ref >60.00)
Glucose, Bld: 68 mg/dL — ABNORMAL LOW (ref 70–99)
Potassium: 4.2 mEq/L (ref 3.5–5.1)
Sodium: 143 mEq/L (ref 135–145)

## 2022-04-28 LAB — BRAIN NATRIURETIC PEPTIDE: Pro B Natriuretic peptide (BNP): 1338 pg/mL — ABNORMAL HIGH (ref 0.0–100.0)

## 2022-04-28 NOTE — Progress Notes (Signed)
Please notify pt BNP significantly elevated; concerning for possible acute exacerbation of his CHF. Advise that if his swelling worsens or he develops worsening SOB/respiratory symptoms, he seek emergency care. His BMET was stable so I want him to increase his lasix to 40 mg Twice daily as we discussed yesterday. I see he has follow up with cardiology on 6/7. I asked them if they could move this appointment up and I'm waiting to hear back. Thanks.

## 2022-04-28 NOTE — Telephone Encounter (Signed)
Pt c/o swelling: STAT is pt has developed SOB within 24 hours  If swelling, where is the swelling located? legs  How much weight have you gained and in what time span? About 15 pounds in a week Have you gained 3 pounds in a day or 5 pounds in a week? About 15 pounds in a week  Do you have a log of your daily weights (if so, list)? Around 130 lbs last week, 145 lbs this week  Are you currently taking a fluid pill? yes  Are you currently SOB? Not now, but does get SOB  Have you traveled recently? no  Patient's sister states the patient saw his PCP was told he is retaining fluid. She states he has swelling in his legs and does get SOB, but is also on oxygen 24/7. Phone: [phone]

## 2022-04-28 NOTE — Telephone Encounter (Signed)
Called number back listed, it was patient's sister that is not on the [place], [place]. Informed her that I would have to call patient to see if I would speak to her. Called patient, no answer and voicemail is full. Called patient's sister Daine Floras, who is on the [place]. Informed her of the message received. She is already up to date on what is going on with patient. She stated that her and her sister live out of town and cannot give any definite answers to questions asked. Chart states that patient has not been taking his Lasix. Susie stated that she is not sure if he is taking his medications and she will be in town tomorrow and she will find out. Patient is scheduled to see APP on 05/10/22. Informed sister that patient never got his follow-up lab work after his visit with Dr. Harl Bowie in March. Informed her this is important, for one it lets [place] know if the medications are working and it lets [place] know if we can increase medications or need to make medication changes. Informed her if patient has not been taking his lasix and he has gain 15 pounds, he might have to go to the ED to get fluid off. Informed her if he is SOB and has edema he should go to the ED. She verbalized understanding and stated she would try to get in touch with her brother and get back with our office. Will forward message to Dr. Harl Bowie for further advisement.

## 2022-05-06 ENCOUNTER — Other Ambulatory Visit (HOSPITAL_COMMUNITY): Payer: Self-pay

## 2022-05-06 ENCOUNTER — Telehealth: Payer: Self-pay | Admitting: Pharmacy Technician

## 2022-05-06 NOTE — Telephone Encounter (Signed)
Patient Advocate Encounter  Received notification from [employer] that prior authorization for Yupelri 175MCG/3ML solution is required.   PA submitted on 6.3.23 Key [id number]  Status is pending   [facility] will continue to follow  Luciano Cutter, CPhT Patient Advocate Phone: [phone]

## 2022-05-06 NOTE — Telephone Encounter (Signed)
Patient Advocate Encounter  Received notification from [employer] that prior authorization for Arformoterol Tartrate 15MCG/2ML nebulizer solution is required.   PA submitted on 6.3.23 Key [id number]  Status is pending   [facility] will continue to follow  Ricke Hey, CPhT Patient Advocate Phone: [phone]

## 2022-05-08 NOTE — Progress Notes (Unsigned)
Cardiology Clinic Note   Patient Name: KASEN RAMMER Date of Encounter: 05/10/2022  Primary Care Provider:  Teena Dunk, NP Primary Cardiologist:  Janina Mayo, MD  Patient Profile    AAMIL MERCHANT 61 year old male presents to the clinic today for evaluation of his lower extremity swelling.  Past Medical History    Past Medical History:  Diagnosis Date   Acute respiratory failure with hypoxia ([place])    Anxiety    Arthritis    hands   CAP (community acquired pneumonia)    Cataract    bilateral-removed   COPD (chronic obstructive pulmonary disease) ([place])    Dyspnea    with exertion   Emphysema of lung ([facility])    GERD (gastroesophageal reflux disease)    Hypertension    Oxygen deficiency    Pneumonia    Substance use disorder    pt.denies as of 01/25/22   Tobacco use    Past Surgical History:  Procedure Laterality Date   BACK SURGERY     Disectomy    cataracts Bilateral    CYST EXCISION     left side of face   ESOPHAGEAL DILATION     ROTATOR CUFF REPAIR Right     x2   UPPER GASTROINTESTINAL ENDOSCOPY     VIDEO BRONCHOSCOPY WITH ENDOBRONCHIAL ULTRASOUND N/A 02/12/2019   Procedure: VIDEO BRONCHOSCOPY WITH ENDOBRONCHIAL ULTRASOUND;  Surgeon: Collene Gobble, MD;  Location: [facility] OR;  Service: Thoracic;  Laterality: N/A;    Allergies  No Known Allergies  History of Present Illness    HAEDEN MARCHAK has a PMH of HFpEF, severe COPD, hypertension, and substance abuse.  He was admitted to the hospital 12/29/2021 with lower extremity swelling and orthopnea.  He was noted to have a weight gain of 31 pounds.  BNP was 1868.  Chest x-ray showed hyper infiltration with positive bibasilar atelectasis more on the left than on the right.  CT chest showed no pulmonary embolus, diffuse emphysema, and 7-8 mm right lower lobe pulmonary nodule.  He received IV diuresis and was discharged 5 L net negative.  His weight at discharge was 136 pounds and was noted to be 151 pounds on  02/09/2022.  He was seen by Dr. Harl Bowie on 02/09/2022.  He reported low energy and felt he had no strength for the past 6 months to a year.  He felt that he had not improved from a fluid standpoint.  He was able to lay flat.  He reported intermittent compliance with his furosemide.  He did not feel he was getting a good response from the medication.  It was felt his dry weight was somewhere between 136 and 140 pounds.  His furosemide was increased and follow-up was planned for 1 month.  He contacted nurse triage line on 04/28/2022.  Medication could not be verified.  Family was instructed that patient may need to go to the emergency department for IV diuresis.  Family expressed understanding.  He presents to the clinic today for follow-up evaluation states he does not have a scale at home.  He has noticed increased lower extremity swelling.  His weight today in the office is 154 pounds.  His dry weight is around 136 to 140 pounds.  We reviewed the importance of avoiding salt, daily weights, and will provide instructions for fluid restriction.  He reports that his p.o. furosemide has not been working well.  I will administer subcu furosemide and plan follow-up for early next week.  I will stop his furosemide and start torsemide 20 mg.  We will increase his potassium to 40 mill equivalents daily.  I have also discussed the importance of lower extremity support stockings and will give the [place] support stocking sheet.  We will also provide him with a home scale.  Today he denies chest pain, increased shortness of breath,  fatigue, palpitations, melena, hematuria, hemoptysis, diaphoresis, weakness, presyncope, syncope, orthopnea, and PND.   Home Medications    Prior to Admission medications   Medication Sig Start Date End Date Taking? Authorizing Provider  albuterol (PROVENTIL) (2.5 MG/3ML) 0.083% nebulizer solution Take 3 mLs (2.5 mg total) by nebulization every 6 (six) hours as needed for wheezing or  shortness of breath. 03/24/22   Cobb, Karie Schwalbe, NP  albuterol (VENTOLIN HFA) 108 (90 Base) MCG/ACT inhaler Inhale 2 puffs into the lungs every 6 (six) hours as needed for wheezing or shortness of breath. 12/26/21   Parrett, Fonnie Mu, NP  arformoterol (BROVANA) 15 MCG/2ML NEBU Take 2 mLs (15 mcg total) by nebulization 2 (two) times daily. 04/27/22   Cobb, Karie Schwalbe, NP  budesonide (PULMICORT) 0.5 MG/2ML nebulizer solution Take 2 mLs (0.5 mg total) by nebulization 2 (two) times daily. 04/27/22   Cobb, Karie Schwalbe, NP  furosemide (LASIX) 20 MG tablet Take 2 tablets (40 mg total) by mouth daily. START: LASIX 40mg  TWICE DAILY for 5 DAYS on 02/09/22- THEN AFTER 5 DAYS DECREASE TO 40mg  ONCE DAILY Patient not taking: Reported on 04/17/2022 02/09/22   Janina Mayo, MD  losartan-hydrochlorothiazide (HYZAAR) 50-12.5 MG tablet Take 1 tablet by mouth daily. 01/06/22 04/06/22  Bo Merino I, NP  meloxicam (MOBIC) 7.5 MG tablet Take 1 tablet (7.5 mg total) by mouth daily. Patient not taking: Reported on 04/17/2022 01/06/22   Bo Merino I, NP  nicotine (NICODERM CQ - DOSED IN MG/24 HOURS) 14 mg/24hr patch Place 1 patch (14 mg total) onto the skin daily for 14 days. 06/08/22 06/22/22  Cobb, Karie Schwalbe, NP  nicotine (NICODERM CQ - DOSED IN MG/24 HOURS) 21 mg/24hr patch Place 1 patch (21 mg total) onto the skin daily. For 6 weeks 04/27/22 06/08/22  Clayton Bibles, NP  nicotine polacrilex (NICOTINE MINI) 4 MG lozenge Take 1 lozenge (4 mg total) by mouth as needed for smoking cessation. 01/06/22   Bo Merino I, NP  OXYGEN Inhale 3-4 L into the lungs.    [provider]  pantoprazole (PROTONIX) 40 MG tablet Take 1 tablet (40 mg total) by mouth daily. Take 30 minutes before breakfast 03/17/22   Jackquline Denmark, MD  potassium chloride SA (KLOR-CON M) 20 MEQ tablet Take 1 tablet (20 mEq total) by mouth daily. 04/27/22   Cobb, Karie Schwalbe, NP  revefenacin (YUPELRI) 175 MCG/3ML nebulizer solution Take 3 mLs (175 mcg  total) by nebulization daily. 04/27/22   Cobb, Karie Schwalbe, NP    Family History    Family History  Problem Relation Age of Onset   Diabetes Mother    Heart disease Mother    Hypertension Mother    Hypertension Father    Colon cancer Neg Hx    Esophageal cancer Neg Hx    Colon polyps Neg Hx    Rectal cancer Neg Hx    Stomach cancer Neg Hx    He indicated that his mother is deceased. He indicated that his father is deceased. He indicated that the status of his neg hx is unknown.  Social History    Social History  Socioeconomic History   Marital status: Widowed    Spouse name: Not on file   Number of children: Not on file   Years of education: Not on file   Highest education level: Not on file  Occupational History   Not on file  Tobacco Use   Smoking status: Some Days    Packs/day: 1.50    Years: 40.00    Pack years: 60.00    Types: Cigarettes    Passive exposure: Current   Smokeless tobacco: Never   Tobacco comments:    Smoking 2cigs per day as of 04/27/22 ep  Vaping Use   Vaping Use: Never used  Substance and Sexual Activity   Alcohol use: Yes    Alcohol/week: 6.0 standard drinks    Types: 6 Cans of beer per week    Comment: per week   Drug use: Not Currently    Types: "Crack" cocaine    Comment: crack - last time  1 month or so ago- it was days before hospitalization in [place], 01/25/2019.   Sexual activity: Yes  Other Topics Concern   Not on file  Social History Narrative   Not on file   Social Determinants of Health   Financial Resource Strain: Not on file  Food Insecurity: Not on file  Transportation Needs: Not on file  Physical Activity: Not on file  Stress: Not on file  Social Connections: Not on file  Intimate Partner Violence: Not on file     Review of Systems    General:  No chills, fever, night sweats or weight changes.  Cardiovascular:  No chest pain, dyspnea on exertion, edema, orthopnea, palpitations, paroxysmal nocturnal  dyspnea. Dermatological: No rash, lesions/masses Respiratory: No cough, dyspnea with routine physical activity Urologic: No hematuria, dysuria Abdominal:   No nausea, vomiting, diarrhea, bright red blood per rectum, melena, or hematemesis Neurologic:  No visual changes, wkns, changes in mental status. All other systems reviewed and are otherwise negative except as noted above.  Physical Exam    VS:  BP 132/81   Ht 5\' 11"  (1.803 m)   Wt 154 lb (69.9 kg)   BMI 21.48 kg/m  , BMI Body mass index is 21.48 kg/m. GEN: Well nourished, well developed, in no acute distress. HEENT: normal. Neck: Supple, no JVD, carotid bruits, or masses. Cardiac: RRR, no murmurs, rubs, or gallops. No clubbing, cyanosis, +2 pitting bilateral lower extremity edema to knee .  Radials/DP/PT 2+ and equal bilaterally.  Respiratory:  Respirations regular and unlabored, clear to auscultation bilaterally. GI: Soft, nontender, nondistended, BS + x 4. MS: no deformity or atrophy. Skin: warm and dry, no rash. Neuro:  Strength and sensation are intact. Psych: Normal affect.  Accessory Clinical Findings    Recent Labs: 12/26/2021: B Natriuretic Peptide 1,868.7 12/28/2021: Magnesium 2.1 02/20/2022: ALT 9 04/17/2022: Hemoglobin 13.5; Platelets 176.0 04/27/2022: BUN 13; Creatinine, Ser 0.87; Potassium 4.2; Pro B Natriuretic peptide (BNP) 1,338.0; Sodium 143   Recent Lipid Panel    Component Value Date/Time   CHOL 170 01/06/2022 1446   TRIG 50 01/06/2022 1446   HDL 78 01/06/2022 1446   CHOLHDL 2.2 01/06/2022 1446   LDLCALC 82 01/06/2022 1446    ECG personally reviewed by me today-none today.  Echocardiogram 12/27/2021 IMPRESSIONS     1. Left ventricular ejection fraction, by estimation, is 50%. The left  ventricle has low normal function. The left ventricle has no regional wall  motion abnormalities. Left ventricular diastolic parameters are consistent  with Grade I  diastolic  dysfunction (impaired relaxation).    2. Right ventricular systolic function is normal. The right ventricular  size is dilated. Tricuspid regurgitation signal is inadequate for  assessing PA pressure.   3. The mitral valve is normal in structure. Trivial mitral valve  regurgitation. No evidence of mitral stenosis.   4. The aortic valve was not well visualized. Aortic valve regurgitation  is not visualized.   5. The inferior vena cava is dilated in size with >50% respiratory  variability, suggesting right atrial pressure of 8 mmHg.   6. Cannot exclude a small PFO.   Comparison(s): No prior Echocardiogram.  Assessment & Plan   1.  HFpEF-notes increased dyspnea and orthopnea.  Weight today 154 Continues to report intermittent compliance with furosemide.  Has not been weighing daily. Continue  losartan, HCTZ,  Stop furosemide  Increase potassium 40 mill equivalents daily Start torsemide 20 mill equivalents Daily weights-contact office with a weight increase of 2-3 pounds overnight or 5 pounds in 1 week. Lower extremity support stockings Fluid restriction-less than 48 ounces daily Elevate lower extremities when not active Heart healthy low-sodium diet-salty 6 given Increase physical activity as tolerated Order BMP and CBC Monday Start 80 mg subcu Lasix  Essential hypertension-BP today 132/81. Continue HCTZ, losartan, furosemide Heart healthy low-sodium diet-salty 6 given Increase physical activity as tolerated  Hyperlipidemia-LDL 82 on 01/06/22 Heart healthy low-sodium high-fiber diet.   Increase physical activity as tolerated  COPD-stable.  Requiring 3 liters of oxygen at rest and 4 L with activity.  Instructed to contact pulmonology to review O2 prescription. Continue current medical therapy Follows with PCP and pulmonology  Disposition: Follow-up with me in 1 week.  Jossie Ng. Alvaro Aungst NP-C    05/10/2022, 4:10 PM [facility] [address] Office [phone] Fax [phone]  Notice: This dictation was prepared with Dragon dictation along with smaller phrase technology. Any transcriptional errors that result from this process are unintentional and may not be corrected upon review.  I spent 16 minutes examining this patient, reviewing medications, and using patient centered shared decision making involving her cardiac care.  Prior to her visit I spent greater than 20 minutes reviewing her past medical history,  medications, and prior cardiac tests.

## 2022-05-09 ENCOUNTER — Ambulatory Visit: Payer: No Typology Code available for payment source | Admitting: Nurse Practitioner

## 2022-05-09 ENCOUNTER — Telehealth: Payer: Self-pay

## 2022-05-09 NOTE — Telephone Encounter (Signed)
Called pt he states that he "looks very swollen". But, he states that he does not have a scale to know for sure. He thinks that he weighs 145#. He denies SOB and does not sound SOB when he is speaking.  He will continue the Lasix and will arrive early for his 335pm appt tomorrow.

## 2022-05-10 ENCOUNTER — Ambulatory Visit (INDEPENDENT_AMBULATORY_CARE_PROVIDER_SITE_OTHER): Payer: No Typology Code available for payment source | Admitting: General Practice

## 2022-05-10 ENCOUNTER — Encounter: Payer: Self-pay | Admitting: Nurse Practitioner

## 2022-05-10 ENCOUNTER — Encounter: Payer: Self-pay | Admitting: General Practice

## 2022-05-10 ENCOUNTER — Other Ambulatory Visit: Payer: Self-pay | Admitting: General Practice

## 2022-05-10 ENCOUNTER — Ambulatory Visit (INDEPENDENT_AMBULATORY_CARE_PROVIDER_SITE_OTHER): Payer: No Typology Code available for payment source | Admitting: Nurse Practitioner

## 2022-05-10 VITALS — BP 159/100 | HR 113 | Temp 98.4°F | Ht 71.0 in | Wt 154.0 lb

## 2022-05-10 VITALS — BP 132/81 | Ht 71.0 in | Wt 154.0 lb

## 2022-05-10 DIAGNOSIS — G8929 Other chronic pain: Secondary | ICD-10-CM | POA: Diagnosis not present

## 2022-05-10 DIAGNOSIS — M546 Pain in thoracic spine: Secondary | ICD-10-CM | POA: Diagnosis not present

## 2022-05-10 DIAGNOSIS — I10 Essential (primary) hypertension: Secondary | ICD-10-CM

## 2022-05-10 DIAGNOSIS — J449 Chronic obstructive pulmonary disease, unspecified: Secondary | ICD-10-CM | POA: Diagnosis not present

## 2022-05-10 DIAGNOSIS — I5030 Unspecified diastolic (congestive) heart failure: Secondary | ICD-10-CM

## 2022-05-10 DIAGNOSIS — I5032 Chronic diastolic (congestive) heart failure: Secondary | ICD-10-CM

## 2022-05-10 DIAGNOSIS — R000 Tachycardia, unspecified: Secondary | ICD-10-CM

## 2022-05-10 DIAGNOSIS — E782 Mixed hyperlipidemia: Secondary | ICD-10-CM

## 2022-05-10 MED ORDER — POTASSIUM CHLORIDE CRYS ER 20 MEQ PO TBCR: 40.0000 meq | EXTENDED_RELEASE_TABLET | Freq: Every day | ORAL | 1 refills | Status: DC

## 2022-05-10 MED ORDER — TORSEMIDE 20 MG PO TABS: 20.0000 mg | ORAL_TABLET | Freq: Every day | ORAL | 1 refills | Status: DC

## 2022-05-10 MED ORDER — METOPROLOL SUCCINATE ER 25 MG PO TB24: 25.0000 mg | ORAL_TABLET | Freq: Every day | ORAL | 3 refills | Status: DC

## 2022-05-10 NOTE — Patient Instructions (Signed)
You were seen today in the [facility] for reevaluation of chronic illness. Labs were collected, results will be available via MyChart or, if abnormal, you will be contacted by clinic staff. You were prescribed medications, please take as directed. Please follow up in 3 mths for reevaluation of hypertension and tachycardia

## 2022-05-10 NOTE — Patient Instructions (Signed)
Medication Instructions:  LASIX INJECTION-KEEP ON FOR 5 HOURS  STOP LASIX   START TORSEMIDE 20MG  DAILY  *If you need a refill on your cardiac medications before your next appointment, please call your pharmacy*  Lab Work:    BMET AND CBC ON MONDAY      If you have labs (blood work) drawn today and your tests are completely normal, you will receive your results only by: MyChart Message (if you have MyChart) OR  A paper copy in the mail If you have any lab test that is abnormal or we need to change your treatment, we will call you to review the results.  Special Instructions PLEASE READ AND FOLLOW SALTY 6-ATTACHED-1,800mg  daily  TAKE AND LOG YOUR WEIGHT DAILY  CALL PULMONARY FOR YOUR OXYGEN  FLUID RESTRICTION 48 OUNCES OR LESS DAILY  STOCKINGS-YOU MAY GET THE ONES WITH THE [employer]TRY [employer].  PLEASE PURCHASE AND WEAR COMPRESSION STOCKINGS DAILY AND TAKE OFF AT BEDTIME. Compression stockings are elastic socks that squeeze the legs. They help to increase blood flow to the legs and to decrease swelling in the legs from fluid retention, and reduce the chance of developing blood clots in the lower legs. Please put on in the AM when dressing and off at night when dressing for bed.  LET THEM KNOW THAT YOU NEED KNEE HIGH'S WITH COMPRESSION OF 15-20 mmhg.  [employer]  [address] ([phone]); [place], [place]; [phone]  EMAIL   [email or web address].  PLEASE MAKE SURE TO ELEVATE YOUR FEET & LEGS WHILE SITTING, THIS WILL HELP WITH THE SWELLING ALSO.    Follow-Up: Your next appointment:  MONDAY  AT 255PM  In Person with [phone], FNP nt  :1  At [facility], you and your health needs are our priority.  As part of our continuing mission to provide you with exceptional heart care, we have created designated Provider Care Teams.  These Care Teams include your primary Cardiologist (physician) and Advanced Practice Providers (APPs -  Physician  Assistants and Nurse Practitioners) who all work together to provide you with the care you need, when you need it.   Important Information About Sugar               6 SALTY THINGS TO AVOID     1,800MG  DAILY

## 2022-05-10 NOTE — Progress Notes (Signed)
[place] [place], [place]  [address] Phone:  [phone]   Fax:  [phone] Subjective:   Patient ID: Sean Hull, male    DOB: [birth year], 61 y.o.   MRN: [id number]  Chief Complaint  Patient presents with   Follow-up    3 month follow up;hypertension Patient states that he is having trouble breathing and left his oxygen in the car so he is a little short of breath right now. Patient has been having a lot of twitching in his hand and body that has been going on x 3 months or more.   HPI Sean Hull 61 y.o. male  has a past medical history of Acute respiratory failure with hypoxia ([place]), Anxiety, Arthritis, CAP (community acquired pneumonia), Cataract, COPD (chronic obstructive pulmonary disease) ([place]), Dyspnea, Emphysema of lung ([place]), GERD (gastroesophageal reflux disease), Hypertension, Oxygen deficiency, Pneumonia, Substance use disorder, and Tobacco use. To the [facility] for reevaluation of HTN and twitching.   When question about today's B/P states that he has been compliant with all medications. Hypertension: Patient here for follow-up of elevated blood pressure. He is not exercising and is adherent to low salt diet.  Denies checking B/P at home.Cardiac symptoms none. Patient denies none.  Cardiovascular risk factors: advanced age (older than 12 for men, 2 for women), hypertension, and male gender. Use of agents associated with hypertension: none. History of target organ damage: none.   When questioned about being short of breath, states that it is related to walking from car to office. Left O2 in the car. Has been compliant with pulmonology appointments. Last appointment with pulmonology last week. Endorses continued tobacco abuse, down to 2-3 cigarettes per day.   Concerned today about pain in neck and back, requesting pain management. Denies any other concerns today.   Denies any fatigue, chest pain, HA or dizziness. Denies any blurred  vision, numbness or tingling.   Past Medical History:  Diagnosis Date   Acute respiratory failure with hypoxia ([place])    Anxiety    Arthritis    hands   CAP (community acquired pneumonia)    Cataract    bilateral-removed   COPD (chronic obstructive pulmonary disease) ([place])    Dyspnea    with exertion   Emphysema of lung ([facility])    GERD (gastroesophageal reflux disease)    Hypertension    Oxygen deficiency    Pneumonia    Substance use disorder    pt.denies as of 01/25/22   Tobacco use     Past Surgical History:  Procedure Laterality Date   BACK SURGERY     Disectomy    cataracts Bilateral    CYST EXCISION     left side of face   ESOPHAGEAL DILATION     ROTATOR CUFF REPAIR Right     x2   UPPER GASTROINTESTINAL ENDOSCOPY     VIDEO BRONCHOSCOPY WITH ENDOBRONCHIAL ULTRASOUND N/A 02/12/2019   Procedure: VIDEO BRONCHOSCOPY WITH ENDOBRONCHIAL ULTRASOUND;  Surgeon: Collene Gobble, MD;  Location: [facility] OR;  Service: Thoracic;  Laterality: N/A;    Family History  Problem Relation Age of Onset   Diabetes Mother    Heart disease Mother    Hypertension Mother    Hypertension Father    Colon cancer Neg Hx    Esophageal cancer Neg Hx    Colon polyps Neg Hx    Rectal cancer Neg Hx    Stomach cancer Neg Hx     Social  History   Socioeconomic History   Marital status: Widowed    Spouse name: Not on file   Number of children: Not on file   Years of education: Not on file   Highest education level: Not on file  Occupational History   Not on file  Tobacco Use   Smoking status: Some Days    Packs/day: 1.50    Years: 40.00    Total pack years: 60.00    Types: Cigarettes    Passive exposure: Current   Smokeless tobacco: Never   Tobacco comments:    Smoking 2cigs per day as of 04/27/22 ep  Vaping Use   Vaping Use: Never used  Substance and Sexual Activity   Alcohol use: Yes    Alcohol/week: 6.0 standard drinks of alcohol    Types: 6 Cans of beer per week    Comment: per  week   Drug use: Not Currently    Types: "Crack" cocaine    Comment: crack - last time  1 month or so ago- it was days before hospitalization in [place], 01/25/2019.   Sexual activity: Yes  Other Topics Concern   Not on file  Social History Narrative   Not on file   Social Determinants of Health   Financial Resource Strain: Not on file  Food Insecurity: Not on file  Transportation Needs: Not on file  Physical Activity: Not on file  Stress: Not on file  Social Connections: Not on file  Intimate Partner Violence: Not on file    Outpatient Medications Prior to Visit  Medication Sig Dispense Refill   albuterol (PROVENTIL) (2.5 MG/3ML) 0.083% nebulizer solution Take 3 mLs (2.5 mg total) by nebulization every 6 (six) hours as needed for wheezing or shortness of breath. 75 mL 12   albuterol (VENTOLIN HFA) 108 (90 Base) MCG/ACT inhaler Inhale 2 puffs into the lungs every 6 (six) hours as needed for wheezing or shortness of breath. 18 g 3   arformoterol (BROVANA) 15 MCG/2ML NEBU Take 2 mLs (15 mcg total) by nebulization 2 (two) times daily. 360 mL 3   budesonide (PULMICORT) 0.5 MG/2ML nebulizer solution Take 2 mLs (0.5 mg total) by nebulization 2 (two) times daily. 360 mL 3   meloxicam (MOBIC) 7.5 MG tablet Take 1 tablet (7.5 mg total) by mouth daily. 30 tablet 0   [START ON 06/08/2022] nicotine (NICODERM CQ - DOSED IN MG/24 HOURS) 14 mg/24hr patch Place 1 patch (14 mg total) onto the skin daily for 14 days. 14 patch 0   nicotine (NICODERM CQ - DOSED IN MG/24 HOURS) 21 mg/24hr patch Place 1 patch (21 mg total) onto the skin daily. For 6 weeks 42 patch 0   OXYGEN Inhale 3-4 L into the lungs.     pantoprazole (PROTONIX) 40 MG tablet Take 1 tablet (40 mg total) by mouth daily. Take 30 minutes before breakfast 90 tablet 4   revefenacin (YUPELRI) 175 MCG/3ML nebulizer solution Take 3 mLs (175 mcg total) by nebulization daily. 270 mL 3   furosemide (LASIX) 20 MG tablet Take 2 tablets (40 mg total) by  mouth daily. START: LASIX 66m TWICE DAILY for 5 DAYS on 02/09/22- THEN AFTER 5 DAYS DECREASE TO 450mONCE DAILY 90 tablet 3   potassium chloride SA (KLOR-CON M) 20 MEQ tablet Take 1 tablet (20 mEq total) by mouth daily. 30 tablet 0   losartan-hydrochlorothiazide (HYZAAR) 50-12.5 MG tablet Take 1 tablet by mouth daily. 30 tablet 2   nicotine polacrilex (NICOTINE MINI) 4 MG  lozenge Take 1 lozenge (4 mg total) by mouth as needed for smoking cessation. 100 tablet 0   No facility-administered medications prior to visit.    No Known Allergies  Review of Systems  Constitutional:  Negative for chills, fever and malaise/fatigue.  HENT: Negative.    Eyes: Negative.   Respiratory:  Positive for shortness of breath. Negative for cough.   Cardiovascular:  Negative for chest pain, palpitations and leg swelling.  Gastrointestinal:  Negative for abdominal pain, blood in stool, constipation, diarrhea, nausea and vomiting.  Musculoskeletal:  Positive for back pain and neck pain.  Skin: Negative.   Neurological: Negative.   Psychiatric/Behavioral:  Negative for depression. The patient is not nervous/anxious.   All other systems reviewed and are negative.      Objective:    Physical Exam Constitutional:      General: He is not in acute distress.    Appearance: Normal appearance.  HENT:     Head: Normocephalic.  Neck:     Vascular: No carotid bruit.  Cardiovascular:     Rate and Rhythm: Normal rate and regular rhythm.     Pulses: Normal pulses.     Heart sounds: Normal heart sounds.     Comments: No obvious peripheral edema Pulmonary:     Effort: Pulmonary effort is normal. No respiratory distress.     Breath sounds: No stridor. Rales present. No wheezing or rhonchi.     Comments: Shortness of breath noted, but patient in no acute distress  Chest:     Chest wall: No tenderness.  Musculoskeletal:        General: No swelling, tenderness, deformity or signs of injury. Normal range of motion.      Cervical back: Normal range of motion and neck supple. No rigidity or tenderness.     Right lower leg: No edema.     Left lower leg: No edema.  Lymphadenopathy:     Cervical: No cervical adenopathy.  Skin:    General: Skin is warm and dry.     Capillary Refill: Capillary refill takes less than 2 seconds.  Neurological:     General: No focal deficit present.     Mental Status: He is alert and oriented to person, place, and time.  Psychiatric:        Mood and Affect: Mood normal.        Behavior: Behavior normal.        Thought Content: Thought content normal.        Judgment: Judgment normal.     BP (!) 159/100   Pulse (!) 113   Temp 98.4 F (36.9 C)   Ht 5' 11" (1.803 m)   Wt 154 lb (69.9 kg)   SpO2 95%   BMI 21.48 kg/m  Wt Readings from Last 3 Encounters:  05/10/22 154 lb (69.9 kg)  05/10/22 154 lb (69.9 kg)  04/27/22 148 lb 12.8 oz (67.5 kg)    Immunization History  Administered Date(s) Administered   Influenza,inj,Quad PF,6+ Mos 11/10/2019, 12/29/2021   Influenza-Unspecified 11/10/2019   Pneumococcal Polysaccharide-23 11/10/2019    Diabetic Foot Exam - Simple   No data filed     No results found for: "TSH" Lab Results  Component Value Date   WBC 10.5 04/17/2022   HGB 13.5 04/17/2022   HCT 43.2 04/17/2022   MCV 97.1 04/17/2022   PLT 176.0 04/17/2022   Lab Results  Component Value Date   NA 143 04/27/2022   K 4.2 04/27/2022  CO2 45 (H) 04/27/2022   GLUCOSE 68 (L) 04/27/2022   BUN 13 04/27/2022   CREATININE 0.87 04/27/2022   BILITOT 0.6 02/20/2022   ALKPHOS 94 02/20/2022   AST 13 02/20/2022   ALT 9 02/20/2022   PROT 6.6 02/20/2022   ALBUMIN 4.2 02/20/2022   CALCIUM 8.2 (L) 04/27/2022   ANIONGAP 7 12/29/2021   EGFR 74 02/20/2022   GFR 93.90 04/27/2022   Lab Results  Component Value Date   CHOL 170 01/06/2022   Lab Results  Component Value Date   HDL 78 01/06/2022   Lab Results  Component Value Date   LDLCALC 82 01/06/2022   Lab  Results  Component Value Date   TRIG 50 01/06/2022   Lab Results  Component Value Date   CHOLHDL 2.2 01/06/2022   Lab Results  Component Value Date   HGBA1C 5.6 01/06/2022   HGBA1C 5.6 01/06/2022   HGBA1C 5.6 (A) 01/06/2022   HGBA1C 5.6 01/06/2022       Assessment & Plan:   Problem List Items Addressed This Visit       Cardiovascular and Mediastinum   Hypertension   Relevant Medications   metoprolol succinate (TOPROL-XL) 25 MG 24 hr tablet, initiated during visit Encouraged continued diet and exercise efforts  Encouraged continued compliance with medication       Other   Back pain   Relevant Orders   Ambulatory referral to Pain Clinic Discussed non pharmacological methods for management of symptoms Informed to take OTC medications as needed    Tachycardia - Primary   Relevant Medications   metoprolol succinate (TOPROL-XL) 25 MG 24 hr tablet, initiate during visit  Encouraged to maintain follow up and treatment plan established by pulmonology Encouraged to use O2 as prescribed throughout the day   Follow up in 3 mths for reevaluation of HTN and tachycardia, sooner as needed     I am having Sean Hull start on metoprolol succinate. I am also having him maintain his albuterol, losartan-hydrochlorothiazide, meloxicam, nicotine polacrilex, OXYGEN, pantoprazole, albuterol, arformoterol, budesonide, revefenacin, nicotine, and nicotine.  Meds ordered this encounter  Medications   metoprolol succinate (TOPROL-XL) 25 MG 24 hr tablet    Sig: Take 1 tablet (25 mg total) by mouth daily.    Dispense:  90 tablet    Refill:  3     Teena Dunk, NP

## 2022-05-11 ENCOUNTER — Telehealth: Payer: Self-pay | Admitting: Nurse Practitioner

## 2022-05-11 DIAGNOSIS — J9611 Chronic respiratory failure with hypoxia: Secondary | ICD-10-CM

## 2022-05-11 NOTE — Telephone Encounter (Signed)
I left a brief message per DPR that if he was having a lot of trouble breathing to get him to the ER. She was asked to call back tomorrow. He would need a qualifying walk test to change medical supply companies.

## 2022-05-12 ENCOUNTER — Telehealth: Payer: Self-pay

## 2022-05-12 NOTE — Telephone Encounter (Addendum)
Called pt to follow up from appt and Lasix. He states that he did take off the lasix unit at indicated time as discussed. He states that his weight yesterday was 148.7 and Today 146. His weight here at his appt was 154 so, he is down 8#. Pt states that he did have increased urination and he will take his Torsemide as prescribed. He will follow up as planned. Edd Fabian, FNP-C notified.

## 2022-05-12 NOTE — Telephone Encounter (Signed)
Yes, please send the order so they can switch. Is he still having trouble breathing? I aw the note prior mentioning this. Thanks.

## 2022-05-12 NOTE — Telephone Encounter (Signed)
Called and spoke with Susie. She stated that the patient has been having a hard time with getting his oxygen from [employer]. They are requiring him to bring his tanks into their office for refills. Because of this, they want to switch to [facility].   Per Lynnell Dike, she has been in contact with Maralyn Sago at [facility]. Maralyn Sago stated that all [facility] needs is an order, recent office visit and a walk test from the past 90 days. Patient was recently walked on 04/17/22 during his OV.   Katie, please advise if you are ok with [place] placing an order for him to switch to [facility]. Thanks!

## 2022-05-12 NOTE — Telephone Encounter (Signed)
Called and spoke to patients sister susie and notified her that the order placed.  Asked about the patients breathing and she states she lives in [place] but from what patient has told her, he is ok with O2 and has a concentrator at home just empty portable tanks but he does have two small refillable tanks he can refill with his concentrator. I told her if the patient needs [place] for anything further or if his breathing is worse, to give our office a call. Nothing further needed.

## 2022-05-15 ENCOUNTER — Ambulatory Visit: Payer: No Typology Code available for payment source | Admitting: General Practice

## 2022-05-15 NOTE — Progress Notes (Unsigned)
Cardiology Clinic Note   Patient Name: Sean Hull Date of Encounter: 05/17/2022  Primary Care Provider:  Teena Dunk, NP Primary Cardiologist:  Janina Mayo, MD  Patient Profile    Sean Hull 61 year old male presents to the clinic today for follow-up evaluation of his lower extremity swelling.  Past Medical History    Past Medical History:  Diagnosis Date   Acute respiratory failure with hypoxia ([place])    Anxiety    Arthritis    hands   CAP (community acquired pneumonia)    Cataract    bilateral-removed   COPD (chronic obstructive pulmonary disease) ([place])    Dyspnea    with exertion   Emphysema of lung ([facility])    GERD (gastroesophageal reflux disease)    Hypertension    Oxygen deficiency    Pneumonia    Substance use disorder    pt.denies as of 01/25/22   Tobacco use    Past Surgical History:  Procedure Laterality Date   BACK SURGERY     Disectomy    cataracts Bilateral    CYST EXCISION     left side of face   ESOPHAGEAL DILATION     ROTATOR CUFF REPAIR Right     x2   UPPER GASTROINTESTINAL ENDOSCOPY     VIDEO BRONCHOSCOPY WITH ENDOBRONCHIAL ULTRASOUND N/A 02/12/2019   Procedure: VIDEO BRONCHOSCOPY WITH ENDOBRONCHIAL ULTRASOUND;  Surgeon: Collene Gobble, MD;  Location: [facility] OR;  Service: Thoracic;  Laterality: N/A;    Allergies  No Known Allergies  History of Present Illness    Sean Hull has a PMH of HFpEF, severe COPD, hypertension, and substance abuse.  He was admitted to the hospital 12/29/2021 with lower extremity swelling and orthopnea.  He was noted to have a weight gain of 31 pounds.  BNP was 1868.  Chest x-ray showed hyper infiltration with positive bibasilar atelectasis more on the left than on the right.  CT chest showed no pulmonary embolus, diffuse emphysema, and 7-8 mm right lower lobe pulmonary nodule.  He received IV diuresis and was discharged 5 L net negative.  His weight at discharge was 136 pounds and was noted to be 151  pounds on 02/09/2022.  He was seen by Dr. Harl Bowie on 02/09/2022.  He reported low energy and felt he had no strength for the past 6 months to a year.  He felt that he had not improved from a fluid standpoint.  He was able to lay flat.  He reported intermittent compliance with his furosemide.  He did not feel he was getting a good response from the medication.  It was felt his dry weight was somewhere between 136 and 140 pounds.  His furosemide was increased and follow-up was planned for 1 month.  He contacted nurse triage line on 04/28/2022.  Medication could not be verified.  Family was instructed that patient may need to go to the emergency department for IV diuresis.  Family expressed understanding.  He presented to the clinic 05/10/22 for follow-up evaluation stated he did not have a scale at home.  He had noticed increased lower extremity swelling.  His weight  in the office  was 154 pounds.  His dry weight is around 136 to 140 pounds.  We reviewed the importance of avoiding salt, daily weights, and l provided instructions for fluid restriction.  He reported that his p.o. furosemide had not been working well.  I  administer subcu furosemide 80 mg and planned follow-up for  early next week.  I  stopped his furosemide and started torsemide 20 mg.  I also increased his potassium to 40 mill equivalents daily.  I  discussed the importance of lower extremity support stockings and gave the [employer] support stocking sheet.  We  provide him with a home scale.   He presents to the clinic today for follow-up evaluation states he is feeling much better today.  His weight today is 146 pounds.  Blood pressure is 118/74.  We reviewed the importance of daily weights and fluid restriction.  He reports that he is drinking about 84 ounces per day.  He did not feel the torsemide was working and he switched back to furosemide.  He was taking 40 mg twice per day.  I will discontinue his torsemide, continue his furosemide, decrease  his potassium, order a BMP and CBC.  We will plan follow-up in 2 to 3 months.  I will also give the [employer] support stocking sheet and asked him to elevate his lower extremities when he is not active.  Today he denies chest pain, increased shortness of breath,  fatigue, palpitations, melena, hematuria, hemoptysis, diaphoresis, weakness, presyncope, syncope, orthopnea, and PND.   Home Medications    Prior to Admission medications   Medication Sig Start Date End Date Taking? Authorizing Provider  albuterol (PROVENTIL) (2.5 MG/3ML) 0.083% nebulizer solution Take 3 mLs (2.5 mg total) by nebulization every 6 (six) hours as needed for wheezing or shortness of breath. 03/24/22   Cobb, Karie Schwalbe, NP  albuterol (VENTOLIN HFA) 108 (90 Base) MCG/ACT inhaler Inhale 2 puffs into the lungs every 6 (six) hours as needed for wheezing or shortness of breath. 12/26/21   Parrett, Fonnie Mu, NP  arformoterol (BROVANA) 15 MCG/2ML NEBU Take 2 mLs (15 mcg total) by nebulization 2 (two) times daily. 04/27/22   Cobb, Karie Schwalbe, NP  budesonide (PULMICORT) 0.5 MG/2ML nebulizer solution Take 2 mLs (0.5 mg total) by nebulization 2 (two) times daily. 04/27/22   Cobb, Karie Schwalbe, NP  furosemide (LASIX) 20 MG tablet Take 2 tablets (40 mg total) by mouth daily. START: LASIX 40mg  TWICE DAILY for 5 DAYS on 02/09/22- THEN AFTER 5 DAYS DECREASE TO 40mg  ONCE DAILY Patient not taking: Reported on 04/17/2022 02/09/22   Janina Mayo, MD  losartan-hydrochlorothiazide (HYZAAR) 50-12.5 MG tablet Take 1 tablet by mouth daily. 01/06/22 04/06/22  Bo Merino I, NP  meloxicam (MOBIC) 7.5 MG tablet Take 1 tablet (7.5 mg total) by mouth daily. Patient not taking: Reported on 04/17/2022 01/06/22   Bo Merino I, NP  nicotine (NICODERM CQ - DOSED IN MG/24 HOURS) 14 mg/24hr patch Place 1 patch (14 mg total) onto the skin daily for 14 days. 06/08/22 06/22/22  Cobb, Karie Schwalbe, NP  nicotine (NICODERM CQ - DOSED IN MG/24 HOURS) 21 mg/24hr patch Place 1  patch (21 mg total) onto the skin daily. For 6 weeks 04/27/22 06/08/22  Clayton Bibles, NP  nicotine polacrilex (NICOTINE MINI) 4 MG lozenge Take 1 lozenge (4 mg total) by mouth as needed for smoking cessation. 01/06/22   Bo Merino I, NP  OXYGEN Inhale 3-4 L into the lungs.    [provider]  pantoprazole (PROTONIX) 40 MG tablet Take 1 tablet (40 mg total) by mouth daily. Take 30 minutes before breakfast 03/17/22   Jackquline Denmark, MD  potassium chloride SA (KLOR-CON M) 20 MEQ tablet Take 1 tablet (20 mEq total) by mouth daily. 04/27/22   Cobb, Karie Schwalbe, NP  revefenacin Maretta Bees)  175 MCG/3ML nebulizer solution Take 3 mLs (175 mcg total) by nebulization daily. 04/27/22   Cobb, Karie Schwalbe, NP    Family History    Family History  Problem Relation Age of Onset   Diabetes Mother    Heart disease Mother    Hypertension Mother    Hypertension Father    Colon cancer Neg Hx    Esophageal cancer Neg Hx    Colon polyps Neg Hx    Rectal cancer Neg Hx    Stomach cancer Neg Hx    He indicated that his mother is deceased. He indicated that his father is deceased. He indicated that the status of his neg hx is unknown.  Social History    Social History   Socioeconomic History   Marital status: Widowed    Spouse name: Not on file   Number of children: Not on file   Years of education: Not on file   Highest education level: Not on file  Occupational History   Not on file  Tobacco Use   Smoking status: Some Days    Packs/day: 1.50    Years: 40.00    Total pack years: 60.00    Types: Cigarettes    Passive exposure: Current   Smokeless tobacco: Never   Tobacco comments:    Smoking 2cigs per day as of 04/27/22 ep  Vaping Use   Vaping Use: Never used  Substance and Sexual Activity   Alcohol use: Yes    Alcohol/week: 6.0 standard drinks of alcohol    Types: 6 Cans of beer per week    Comment: per week   Drug use: Not Currently    Types: "Crack" cocaine    Comment: crack -  last time  1 month or so ago- it was days before hospitalization in [place], 01/25/2019.   Sexual activity: Yes  Other Topics Concern   Not on file  Social History Narrative   Not on file   Social Determinants of Health   Financial Resource Strain: Not on file  Food Insecurity: Not on file  Transportation Needs: Not on file  Physical Activity: Not on file  Stress: Not on file  Social Connections: Not on file  Intimate Partner Violence: Not on file     Review of Systems    General:  No chills, fever, night sweats or weight changes.  Cardiovascular:  No chest pain, dyspnea on exertion, edema, orthopnea, palpitations, paroxysmal nocturnal dyspnea. Dermatological: No rash, lesions/masses Respiratory: No cough, no dyspnea Urologic: No hematuria, dysuria Abdominal:   No nausea, vomiting, diarrhea, bright red blood per rectum, melena, or hematemesis Neurologic:  No visual changes, wkns, changes in mental status. All other systems reviewed and are otherwise negative except as noted above.  Physical Exam    VS:  BP 118/74   Pulse 80   Ht 5\' 11"  (1.803 m)   Wt 146 lb (66.2 kg)   BMI 20.36 kg/m  , BMI Body mass index is 20.36 kg/m. GEN: Well nourished, well developed, in no acute distress. HEENT: normal. Neck: Supple, no JVD, carotid bruits, or masses. Cardiac: RRR, no murmurs, rubs, or gallops. No clubbing, cyanosis, +1 bilateral lower extremity edema.  Radials/DP/PT 2+ and equal bilaterally.  Respiratory:  Respirations regular and unlabored, clear to auscultation bilaterally. GI: Soft, nontender, nondistended, BS + x 4. MS: no deformity or atrophy. Skin: warm and dry, no rash. Neuro:  Strength and sensation are intact. Psych: Normal affect.  Accessory Clinical Findings    Recent  Labs: 12/26/2021: B Natriuretic Peptide 1,868.7 12/28/2021: Magnesium 2.1 02/20/2022: ALT 9 04/17/2022: Hemoglobin 13.5; Platelets 176.0 04/27/2022: BUN 13; Creatinine, Ser 0.87; Potassium 4.2; Pro B  Natriuretic peptide (BNP) 1,338.0; Sodium 143   Recent Lipid Panel    Component Value Date/Time   CHOL 170 01/06/2022 1446   TRIG 50 01/06/2022 1446   HDL 78 01/06/2022 1446   CHOLHDL 2.2 01/06/2022 1446   LDLCALC 82 01/06/2022 1446    ECG personally reviewed by me today-none today.  Echocardiogram 12/27/2021 IMPRESSIONS     1. Left ventricular ejection fraction, by estimation, is 50%. The left  ventricle has low normal function. The left ventricle has no regional wall  motion abnormalities. Left ventricular diastolic parameters are consistent  with Grade I diastolic  dysfunction (impaired relaxation).   2. Right ventricular systolic function is normal. The right ventricular  size is dilated. Tricuspid regurgitation signal is inadequate for  assessing PA pressure.   3. The mitral valve is normal in structure. Trivial mitral valve  regurgitation. No evidence of mitral stenosis.   4. The aortic valve was not well visualized. Aortic valve regurgitation  is not visualized.   5. The inferior vena cava is dilated in size with >50% respiratory  variability, suggesting right atrial pressure of 8 mmHg.   6. Cannot exclude a small PFO.   Comparison(s): No prior Echocardiogram.  Assessment & Plan   1.  HFpEF-notes increased dyspnea and orthopnea.  Continues on 3 L nasal cannula.  Weight today 146.  Did not have good urine output with torsemide.  Reports compliance with daily weights.   Continue  losartan, HCTZ,  Continue  furosemide 40 BID Resume potassium 20 mill equivalents daily Daily weights-contact office with a weight increase of 2-3 pounds overnight or 5 pounds in 1 week. Lower extremity support stockings-[employer] support stocking sheet given. Fluid restriction-less than 48 ounces daily Elevate lower extremities when not active Heart healthy low-sodium diet-salty 6 reviewed.   Increase physical activity as tolerated Order BMP and CBC    Essential hypertension-BP today  118/74. Continue HCTZ, losartan,  Heart healthy low-sodium diet-salty 6 given Increase physical activity as tolerated  COPD-remains stable.   Nasal cannula 3 liters of oxygen at rest and 4 L with activity.  Instructed to contact pulmonology to review O2 prescription. Continue current medical therapy Follows with PCP and pulmonology  Disposition: Follow-up with Dr. Harl Bowie in 2-3 months.  Jossie Ng. Randa Riss NP-C    05/17/2022, 3:10 PM [place] [address] Office [phone] Fax [phone]  Notice: This dictation was prepared with Dragon dictation along with smaller phrase technology. Any transcriptional errors that result from this process are unintentional and may not be corrected upon review.  I spent 13 minutes examining this patient, reviewing medications, and using patient centered shared decision making involving her cardiac care.  Prior to her visit I spent greater than 20 minutes reviewing her past medical history,  medications, and prior cardiac tests.

## 2022-05-16 NOTE — Addendum Note (Signed)
Addended by: Carleene Mains D on: 05/16/2022 09:48 AM   Modules accepted: Orders

## 2022-05-17 ENCOUNTER — Ambulatory Visit (INDEPENDENT_AMBULATORY_CARE_PROVIDER_SITE_OTHER): Payer: No Typology Code available for payment source | Admitting: General Practice

## 2022-05-17 ENCOUNTER — Encounter: Payer: Self-pay | Admitting: General Practice

## 2022-05-17 VITALS — BP 118/74 | HR 80 | Ht 71.0 in | Wt 146.0 lb

## 2022-05-17 DIAGNOSIS — J449 Chronic obstructive pulmonary disease, unspecified: Secondary | ICD-10-CM | POA: Diagnosis not present

## 2022-05-17 DIAGNOSIS — I5032 Chronic diastolic (congestive) heart failure: Secondary | ICD-10-CM

## 2022-05-17 DIAGNOSIS — I5030 Unspecified diastolic (congestive) heart failure: Secondary | ICD-10-CM | POA: Diagnosis not present

## 2022-05-17 DIAGNOSIS — I10 Essential (primary) hypertension: Secondary | ICD-10-CM | POA: Diagnosis not present

## 2022-05-17 MED ORDER — POTASSIUM CHLORIDE CRYS ER 20 MEQ PO TBCR: 20.0000 meq | EXTENDED_RELEASE_TABLET | Freq: Every day | ORAL | 3 refills | Status: DC

## 2022-05-17 MED ORDER — FUROSEMIDE 40 MG PO TABS: 40.0000 mg | ORAL_TABLET | Freq: Two times a day (BID) | ORAL | 3 refills | Status: DC

## 2022-05-17 NOTE — Patient Instructions (Signed)
Medication Instructions:  STOP TORSEMIDE  RE-START FUROSEMIDE 40MG  TWICE DAILY  DECREASE POTASSIUM 20 MEQ DAILY  *If you need a refill on your cardiac medications before your next appointment, please call your pharmacy*  Lab Work:   Testing/Procedures:  BMET AND CBC TODAY NONE  If you have labs (blood work) drawn today and your tests are completely normal, you will receive your results only by: MyChart Message (if you have MyChart) OR  A paper copy in the mail If you have any lab test that is abnormal or we need to change your treatment, we will call you to review the results.  Special Instructions FLUID RESTRICTION 48 OUNCES OR LESS DAILY  PLEASE LET KNOW IF YOU GAIN 2 POUNDS OVERNIGHT OR 5 POUNDS WEEKLY  PLEASE PURCHASE AND WEAR COMPRESSION STOCKINGS DAILY AND TAKE OFF AT BEDTIME. Compression stockings are elastic socks that squeeze the legs. They help to increase blood flow to the legs and to decrease swelling in the legs from fluid retention, and reduce the chance of developing blood clots in the lower legs. Please put on in the AM when dressing and off at night when dressing for bed.  LET THEM KNOW THAT YOU NEED KNEE HIGH'S WITH COMPRESSION OF 15-20 mmhg.  [employer];  [address] ([phone]); [place], [place]; [phone]  EMAIL   [email or web address].  PLEASE MAKE SURE TO ELEVATE YOUR FEET & LEGS WHILE SITTING, THIS WILL HELP WITH THE SWELLING ALSO.   Follow-Up: Your next appointment:  2-3 month(s) In Person with Branch, [phone], MD   At [facility], you and your health needs are our priority.  As part of our continuing mission to provide you with exceptional heart care, we have created designated Provider Care Teams.  These Care Teams include your primary Cardiologist (physician) and Advanced Practice Providers (APPs -  Physician Assistants and Nurse Practitioners) who all work together to provide you with the care you need, when you need  it.   Important Information About [employer]

## 2022-05-18 ENCOUNTER — Other Ambulatory Visit: Payer: Self-pay

## 2022-05-18 DIAGNOSIS — E875 Hyperkalemia: Secondary | ICD-10-CM

## 2022-05-18 LAB — CBC
Hematocrit: 42 % (ref 37.5–51.0)
Hemoglobin: 13.5 g/dL (ref 13.0–17.7)
MCH: 29.6 pg (ref 26.6–33.0)
MCHC: 32.1 g/dL (ref 31.5–35.7)
MCV: 92 fL (ref 79–97)
Platelets: 261 10*3/uL (ref 150–450)
RBC: 4.56 x10E6/uL (ref 4.14–5.80)
RDW: 15.8 % — ABNORMAL HIGH (ref 11.6–15.4)
WBC: 8.3 10*3/uL (ref 3.4–10.8)

## 2022-05-18 LAB — BASIC METABOLIC PANEL
BUN/Creatinine Ratio: 16 (ref 10–24)
BUN: 19 mg/dL (ref 8–27)
CO2: 39 mmol/L — ABNORMAL HIGH (ref 20–29)
Calcium: 8.8 mg/dL (ref 8.6–10.2)
Chloride: 93 mmol/L — ABNORMAL LOW (ref 96–106)
Creatinine, Ser: 1.16 mg/dL (ref 0.76–1.27)
Glucose: 98 mg/dL (ref 70–99)
Potassium: 5.9 mmol/L (ref 3.5–5.2)
Sodium: 144 mmol/L (ref 134–144)
eGFR: 72 mL/min/{1.73_m2} (ref >59)

## 2022-05-23 ENCOUNTER — Telehealth: Payer: Self-pay | Admitting: Emergency Medicine

## 2022-05-23 DIAGNOSIS — J9611 Chronic respiratory failure with hypoxia: Secondary | ICD-10-CM

## 2022-05-25 NOTE — Telephone Encounter (Signed)
Called and spoke with Sean Hull just to clarify if they just needed an order sent to [employer] for them to D'c services and pick up all oxygen supplies. She said yes. Advised her that order had already been sent to new DME to get him set up with oxygen. She expressed understanding. Order has been placed. Nothing further needed at this time.

## 2022-05-25 NOTE — Telephone Encounter (Signed)
Called and spoke with [employer]. She stated that patient switched DME companies. Angelique Blonder also stated that [employer] told her that they need a letter from the provider stating that the patient switched DME companies and that they can come pick up their new equipment. Patients new DME company is [employer].  RB, please advise.

## 2022-05-25 NOTE — Telephone Encounter (Signed)
Ok to write letter to indicate that the patient has changed DME. Will likely need to include which equipment they are referring to / changing.

## 2022-06-16 ENCOUNTER — Other Ambulatory Visit: Payer: Self-pay | Admitting: General Practice

## 2022-06-21 ENCOUNTER — Ambulatory Visit (INDEPENDENT_AMBULATORY_CARE_PROVIDER_SITE_OTHER): Payer: No Typology Code available for payment source | Admitting: Emergency Medicine

## 2022-06-21 ENCOUNTER — Telehealth: Payer: Self-pay | Admitting: Emergency Medicine

## 2022-06-21 ENCOUNTER — Encounter: Payer: Self-pay | Admitting: Emergency Medicine

## 2022-06-21 DIAGNOSIS — J189 Pneumonia, unspecified organism: Secondary | ICD-10-CM | POA: Diagnosis not present

## 2022-06-21 DIAGNOSIS — R590 Localized enlarged lymph nodes: Secondary | ICD-10-CM | POA: Diagnosis not present

## 2022-06-21 DIAGNOSIS — J449 Chronic obstructive pulmonary disease, unspecified: Secondary | ICD-10-CM

## 2022-06-21 DIAGNOSIS — Z720 Tobacco use: Secondary | ICD-10-CM | POA: Diagnosis not present

## 2022-06-21 MED ORDER — BREZTRI AEROSPHERE 160-9-4.8 MCG/ACT IN AERO: 2.0000 | INHALATION_SPRAY | Freq: Two times a day (BID) | RESPIRATORY_TRACT | 0 refills | Status: DC

## 2022-06-21 NOTE — Patient Instructions (Signed)
Please stop the scheduled nebulizer treatments.  Call our office and let us know which ones you are actually using on a schedule. We will restart Breztri 2 puffs twice a day.  Rinse and gargle after using. Okay to keep your albuterol available to use 2 puffs when you need for shortness of breath, chest tightness, wheezing. Continue your oxygen at 3 L/min. We will repeat your CT scan of the chest in August to follow for resolution of your prior pneumonia Work hard to decrease your cigarette smoking.  Our ultimate goal will be to help you stop altogether. Follow with Dr Delton Coombes in August after your CT scan so we can review the results together

## 2022-06-21 NOTE — Assessment & Plan Note (Signed)
Repeat CT chest in August.  

## 2022-06-21 NOTE — Telephone Encounter (Signed)
Patient wanted to tell us that the nebulizer medication was Albuterol- proventil solution.  He is also wanting to know the date that he got diagnosed with copd and emphysema for his disability paperwork   Please advise sir

## 2022-06-21 NOTE — Assessment & Plan Note (Signed)
Unclear medical regimen.  He is supposed to be on scheduled nebulizer therapy, has been on Breztri in the past.  I will restart the [facility] and assess him for clinical response.  He will call and let us know which nebulizer treatment he was actually using.

## 2022-06-21 NOTE — Telephone Encounter (Signed)
pt sister states that the ot had an appt today 7/19 and was advised to call in to inform us of the nebulizer medication the pt takes. the medication is '' albuterol solution-proventil''- the pt sister is also asking for specific dates for when the pt was diagnosed with copd and emphysema as he is soon to sign up for disability

## 2022-06-21 NOTE — Assessment & Plan Note (Signed)
Right lower lobe and right middle lobe airspace disease, stable mediastinal adenopathy, stable nodule.  He needs a repeat CT chest in August.  Follow-up to review

## 2022-06-21 NOTE — Assessment & Plan Note (Signed)
Discussed cessation with him today.  He will work on cutting down.  Not ready to set a quit date

## 2022-06-21 NOTE — Progress Notes (Signed)
Subjective:    Patient ID: Sean Hull, male    DOB: Sep 20, 1961, 61 y.o.   MRN: [id number]  HPI  Acute OV 07/07/21 --61 year old active smoker with history of substance abuse, prior EBUS for lymphadenopathy that was negative and resolved (treated for pneumonia).  Has a stable pulmonary nodule in the right lower lobe 9 mm, last imaged 04/07/2020. He began to experience more SOB, wheeze. Some cough but mainly when he wakes up in the am. A lot of throat mucous.  I treated him with prednisone taper and doxycycline on 8/1.  Today he reports that he is benefiting some - less LE edema, less exertional SOB. He is using albuterol 2-3x a day. Stopped his Trelegy about 3 months ago, wasn't sure it was helping him much. He is smoking about 10 cig a day.    ROV 06/21/22 --follow-up visit 61 year old gentleman with history of active tobacco use (60 pack years), substance abuse.  I follow him for lymphadenopathy and a right lower lobe pulmonary nodule on chest imaging, severe COPD.  Last seen in our office 04/27/2022.  He also has a history of hypertension with diastolic CHF, CAD. He had a right lower lobe and right middle lobe pneumonia noted on CT chest 04/19/2022, some thickening of the esophagus, mild mediastinal right hilar adenopathy. He reports that he is using a single nebulizer treatment daily but he does not know which medication this is. He has been on yupelri, brovana, pulmicort in the past - ? What he is using. He uses albuterol HFA several times a day. Smoking 0.5pk/day. O2 at 2L/min. Occasional cough, minimal sputum, no blood.      Review of Systems Past Medical History:  Diagnosis Date   Acute respiratory failure with hypoxia (HCC)    Anxiety    Arthritis    hands   CAP (community acquired pneumonia)    Cataract    bilateral-removed   COPD (chronic obstructive pulmonary disease) (HCC)    Dyspnea    with exertion   Emphysema of lung (HCC)    GERD (gastroesophageal reflux disease)     Hypertension    Oxygen deficiency    Pneumonia    Substance use disorder    pt.denies as of 01/25/22   Tobacco use      Family History  Problem Relation Age of Onset   Diabetes Mother    Heart disease Mother    Hypertension Mother    Hypertension Father    Colon cancer Neg Hx    Esophageal cancer Neg Hx    Colon polyps Neg Hx    Rectal cancer Neg Hx    Stomach cancer Neg Hx      Social History   Socioeconomic History   Marital status: Widowed    Spouse name: Not on file   Number of children: Not on file   Years of education: Not on file   Highest education level: Not on file  Occupational History   Not on file  Tobacco Use   Smoking status: Some Days    Packs/day: 1.50    Years: 40.00    Total pack years: 60.00    Types: Cigarettes    Passive exposure: Current   Smokeless tobacco: Never   Tobacco comments:    0.5 pack smoked daily ARJ 06/21/22   Vaping Use   Vaping Use: Never used  Substance and Sexual Activity   Alcohol use: Yes    Alcohol/week: 6.0 standard drinks of alcohol  Types: 6 Cans of beer per week    Comment: per week   Drug use: Not Currently    Types: "Crack" cocaine    Comment: crack - last time  1 month or so ago- it was days before hospitalization in [place], 01/25/2019.   Sexual activity: Yes  Other Topics Concern   Not on file  Social History Narrative   Not on file   Social Determinants of Health   Financial Resource Strain: Not on file  Food Insecurity: Not on file  Transportation Needs: Not on file  Physical Activity: Not on file  Stress: Not on file  Social Connections: Not on file  Intimate Partner Violence: Not on file     No Known Allergies   Outpatient Medications Prior to Visit  Medication Sig Dispense Refill   albuterol (VENTOLIN HFA) 108 (90 Base) MCG/ACT inhaler Inhale 2 puffs into the lungs every 6 (six) hours as needed for wheezing or shortness of breath. 18 g 3   furosemide (LASIX) 40 MG tablet Take 1 tablet (40  mg total) by mouth 2 (two) times daily. 60 tablet 3   metoprolol succinate (TOPROL-XL) 25 MG 24 hr tablet Take 1 tablet (25 mg total) by mouth daily. 90 tablet 3   OXYGEN Inhale 3-4 L into the lungs.     albuterol (PROVENTIL) (2.5 MG/3ML) 0.083% nebulizer solution Take 3 mLs (2.5 mg total) by nebulization every 6 (six) hours as needed for wheezing or shortness of breath. 75 mL 12   arformoterol (BROVANA) 15 MCG/2ML NEBU Take 2 mLs (15 mcg total) by nebulization 2 (two) times daily. 360 mL 3   budesonide (PULMICORT) 0.5 MG/2ML nebulizer solution Take 2 mLs (0.5 mg total) by nebulization 2 (two) times daily. 360 mL 3   nicotine (NICODERM CQ - DOSED IN MG/24 HOURS) 14 mg/24hr patch Place 1 patch (14 mg total) onto the skin daily for 14 days. (Patient not taking: Reported on 06/21/2022) 14 patch 0   nicotine polacrilex (NICOTINE MINI) 4 MG lozenge Take 1 lozenge (4 mg total) by mouth as needed for smoking cessation. (Patient not taking: Reported on 06/21/2022) 100 tablet 0   pantoprazole (PROTONIX) 40 MG tablet Take 1 tablet (40 mg total) by mouth daily. Take 30 minutes before breakfast (Patient not taking: Reported on 06/21/2022) 90 tablet 4   potassium chloride SA (KLOR-CON M) 20 MEQ tablet Take 1 tablet (20 mEq total) by mouth daily. (Patient not taking: Reported on 06/21/2022) 60 tablet 3   revefenacin (YUPELRI) 175 MCG/3ML nebulizer solution Take 3 mLs (175 mcg total) by nebulization daily. 270 mL 3   No facility-administered medications prior to visit.        Objective:   Physical Exam  Vitals:   06/21/22 1122  BP: 128/76  Pulse: 81  Temp: 98.1 F (36.7 C)  TempSrc: Oral  SpO2: 92%  Weight: 141 lb 12.8 oz (64.3 kg)  Height: 5\' 11"  (1.803 m)   Gen: Pleasant, very thin, in no distress,  normal affect  ENT: No lesions,  mouth clear,  no postnasal drip  Neck: No JVD, no stridor  Lungs: No use of accessory muscles, distant, bilateral coarse breath sounds with end expiratory  wheeze  Cardiovascular: RRR, heart sounds normal, no murmur or gallops, no peripheral edema  Musculoskeletal: No deformities, no cyanosis or clubbing  Neuro: alert, awake, non focal  Skin: Warm, no lesions or rash     Assessment & Plan:  COPD, very severe (HCC) Unclear medical regimen.  He is supposed to be on scheduled nebulizer therapy, has been on Breztri in the past.  I will restart the [facility] and assess him for clinical response.  He will call and let us know which nebulizer treatment he was actually using.  Mediastinal lymphadenopathy Repeat CT chest in August  CAP (community acquired pneumonia) Right lower lobe and right middle lobe airspace disease, stable mediastinal adenopathy, stable nodule.  He needs a repeat CT chest in August.  Follow-up to review  Tobacco abuse Discussed cessation with him today.  He will work on cutting down.  Not ready to set a quit date  Levy Pupa, MD, PhD 06/21/2022, 11:40 AM [facility] [phone] or if no answer [phone]

## 2022-06-21 NOTE — Addendum Note (Signed)
Addended by: Dorisann Frames R on: 06/21/2022 11:54 AM   Modules accepted: Orders

## 2022-06-27 NOTE — Telephone Encounter (Signed)
I first saw him in March 2020, so that is when I gave him the diagnosis. I don't know if he had been told by his other caregivers that he had COPD prior to that.

## 2022-06-27 NOTE — Telephone Encounter (Signed)
Called and spoke to patient and went over information form Dr Delton Coombes. Nothing further needed

## 2022-07-20 ENCOUNTER — Encounter: Payer: Self-pay | Admitting: Internal Medicine

## 2022-07-20 ENCOUNTER — Ambulatory Visit (INDEPENDENT_AMBULATORY_CARE_PROVIDER_SITE_OTHER): Payer: No Typology Code available for payment source | Admitting: Internal Medicine

## 2022-07-20 VITALS — BP 150/86 | HR 99 | Ht 71.0 in | Wt 137.2 lb

## 2022-07-20 DIAGNOSIS — R0600 Dyspnea, unspecified: Secondary | ICD-10-CM

## 2022-07-20 MED ORDER — ATORVASTATIN CALCIUM 20 MG PO TABS: 20.0000 mg | ORAL_TABLET | Freq: Every day | ORAL | 3 refills | Status: DC

## 2022-07-20 NOTE — Progress Notes (Signed)
Cardiology Office Note:    Date:  07/20/2022   ID:  Sean Hull, DOB [birth year], MRN [id number]  PCP:  Kathrynn Speed, NP   [facility] Providers Cardiologist:  Maisie Fus, MD     Referring MD: Kathrynn Speed, NP   No chief complaint on file. Shortness of breath  History of Present Illness:    Sean Hull is a 61 y.o. male with a hx HFpeF, of severe COPD, HTN, and substance abuse, recent hospitalization for decompensated CHF/ COPD exacerbation referral for hospital follow up  See DC summary for details 12/29/2021. Briefly p/w  LE edema and orthopnea. He's had a weight gain of 31 pounds. BNP 1868. He has imaging findings below.  Chest radiograph with hyperinflation with positive bibasilar atelectasis more left than right, positive hilar vascular congestion.    CT chest with no pulmonary embolism, diffuse emphysema, with 8-7 mm right lower lobe pulmonary nodule.    He was managed with IV lasix had total net negative 5L.  Last weight 2/22 140 pounds; 136 on discharge.  Wt Readings from Last 3 Encounters:  07/20/22 137 lb 3.2 oz (62.2 kg)  06/21/22 141 lb 12.8 oz (64.3 kg)  05/17/22 146 lb (66.2 kg)   He feels low energy. He feels no strength for about 6 months to 1 year. He has not had a dramatic improvement with this. He has noticed that from a fluid standpoint he has improved. He can lie flat.  He's 151 pounds today which is . He took lasix Sunday or Monday, then he stopped taking it. He did not think he was getting a good response.   Interim hx 07/20/2022 Saw Edd Fabian.  He went to the ED 2/2 difficulty with medications. He followed up in clinic in June. He noted increased LE edema. Weight 154, dry weight is 136-140 lbs. He was given subq lasix 80 mg. Recommended compression stockings. Continued on lasix 40 mg BID. His weight is back to normal at 137. Low strength and energy  Cardiology Studies  TTE-LVEF 50% RV was moderately enlarged. CT did not show PE.  He did not have evidence of pulmonary HTN on echo.   EKG with 89 bpm, RVH  Past Medical History:  Diagnosis Date   Acute respiratory failure with hypoxia (HCC)    Anxiety    Arthritis    hands   CAP (community acquired pneumonia)    Cataract    bilateral-removed   COPD (chronic obstructive pulmonary disease) (HCC)    Dyspnea    with exertion   Emphysema of lung (HCC)    GERD (gastroesophageal reflux disease)    Hypertension    Oxygen deficiency    Pneumonia    Substance use disorder    pt.denies as of 01/25/22   Tobacco use     Past Surgical History:  Procedure Laterality Date   BACK SURGERY     Disectomy    cataracts Bilateral    CYST EXCISION     left side of face   ESOPHAGEAL DILATION     ROTATOR CUFF REPAIR Right     x2   UPPER GASTROINTESTINAL ENDOSCOPY     VIDEO BRONCHOSCOPY WITH ENDOBRONCHIAL ULTRASOUND N/A 02/12/2019   Procedure: VIDEO BRONCHOSCOPY WITH ENDOBRONCHIAL ULTRASOUND;  Surgeon: Leslye Peer, MD;  Location: [facility] OR;  Service: Thoracic;  Laterality: N/A;    Current Medications: Current Outpatient Medications on File Prior to Visit  Medication Sig Dispense Refill   acetaminophen (TYLENOL)  325 MG tablet Take 650 mg by mouth every 6 (six) hours as needed.     albuterol (PROVENTIL) (2.5 MG/3ML) 0.083% nebulizer solution Take 3 mLs (2.5 mg total) by nebulization every 6 (six) hours as needed for wheezing or shortness of breath. 75 mL 12   albuterol (VENTOLIN HFA) 108 (90 Base) MCG/ACT inhaler Inhale 2 puffs into the lungs every 6 (six) hours as needed for wheezing or shortness of breath. 18 g 3   Budeson-Glycopyrrol-Formoterol (BREZTRI AEROSPHERE) 160-9-4.8 MCG/ACT AERO Inhale 2 puffs into the lungs in the morning and at bedtime. 5.9 g 0   furosemide (LASIX) 40 MG tablet Take 1 tablet (40 mg total) by mouth 2 (two) times daily. 60 tablet 3   metoprolol succinate (TOPROL-XL) 25 MG 24 hr tablet Take 1 tablet (25 mg total) by mouth daily. 90 tablet 3    nicotine polacrilex (NICOTINE MINI) 4 MG lozenge Take 1 lozenge (4 mg total) by mouth as needed for smoking cessation. 100 tablet 0   OXYGEN Inhale 3-4 L into the lungs.     potassium chloride SA (KLOR-CON M) 20 MEQ tablet Take 1 tablet (20 mEq total) by mouth daily. 60 tablet 3   pantoprazole (PROTONIX) 40 MG tablet Take 1 tablet (40 mg total) by mouth daily. Take 30 minutes before breakfast (Patient not taking: Reported on 06/21/2022) 90 tablet 4   No current facility-administered medications on file prior to visit.     Allergies:   Patient has no known allergies.   Social History   Socioeconomic History   Marital status: Widowed    Spouse name: Not on file   Number of children: Not on file   Years of education: Not on file   Highest education level: Not on file  Occupational History   Not on file  Tobacco Use   Smoking status: Some Days    Packs/day: 1.50    Years: 40.00    Total pack years: 60.00    Types: Cigarettes    Passive exposure: Current   Smokeless tobacco: Never   Tobacco comments:    0.5 pack smoked daily ARJ 06/21/22   Vaping Use   Vaping Use: Never used  Substance and Sexual Activity   Alcohol use: Yes    Alcohol/week: 6.0 standard drinks of alcohol    Types: 6 Cans of beer per week    Comment: per week   Drug use: Not Currently    Types: "Crack" cocaine    Comment: crack - last time  1 month or so ago- it was days before hospitalization in [place], 01/25/2019.   Sexual activity: Yes  Other Topics Concern   Not on file  Social History Narrative   Not on file   Social Determinants of Health   Financial Resource Strain: Not on file  Food Insecurity: Not on file  Transportation Needs: Not on file  Physical Activity: Not on file  Stress: Not on file  Social Connections: Not on file     Family History: The patient's family history includes Diabetes in his mother; Heart disease in his mother; Hypertension in his father and mother. There is no history  of Colon cancer, Esophageal cancer, Colon polyps, Rectal cancer, or Stomach cancer.  ROS:   Please see the history of present illness.     All other systems reviewed and are negative.  EKGs/Labs/Other Studies Reviewed:    The following studies were reviewed today:   EKG:  EKG is  ordered today.  The  ekg ordered today demonstrates   NSR, RVH  Recent Labs: 12/26/2021: B Natriuretic Peptide 1,868.7 12/28/2021: Magnesium 2.1 02/20/2022: ALT 9 04/27/2022: Pro B Natriuretic peptide (BNP) 1,338.0 05/17/2022: BUN 19; Creatinine, Ser 1.16; Hemoglobin 13.5; Platelets 261; Potassium 5.9; Sodium 144   Recent Lipid Panel    Component Value Date/Time   CHOL 170 01/06/2022 1446   TRIG 50 01/06/2022 1446   HDL 78 01/06/2022 1446   CHOLHDL 2.2 01/06/2022 1446   LDLCALC 82 01/06/2022 1446     Risk Assessment/Calculations:     The 10-year ASCVD risk score (Arnett DK, et al., 2019) is: 13.1%   Values used to calculate the score:     Age: 34 years     Sex: Male     Is Non-Hispanic African American: No     Diabetic: No     Tobacco smoker: Yes     Systolic Blood Pressure: 150 mmHg     Is BP treated: Yes     HDL Cholesterol: 78 mg/dL     Total Cholesterol: 170 mg/dL       Physical Exam:    VS:   Vitals:   07/20/22 1542  BP: (!) 150/86  Pulse: 99     Wt Readings from Last 3 Encounters:  07/20/22 137 lb 3.2 oz (62.2 kg)  06/21/22 141 lb 12.8 oz (64.3 kg)  05/17/22 146 lb (66.2 kg)     GEN: No acute distress HEENT: Normal NECK: No sig JVD LYMPHATICS: No lymphadenopathy CARDIAC: RRR, no murmurs, rubs, gallops RESPIRATORY:  mild wob, inspiratory rales, decreased air movement, no wheezing ABDOMEN: Soft, non-tender, non-distended MUSCULOSKELETAL:  no pitting; No deformity  SKIN: Warm and dry NEUROLOGIC:  Alert and oriented x 3 PSYCHIATRIC:  Normal affect   ASSESSMENT:    HFpeF: LV EF 50%, moderately enlarged RV. RV function was normal. No pulmonary htn. No large ASD. RV  enlargement likely related to significant emphysema. He is euvolemic. Hypoxia in the setting of emphysema.  - euvolemic today - dry weight 136-140 - continue lasix 40 mg BID  HLD- start atorvastatin 20 mg daily  HTN- not checking at home. O2 sat low in the office without O2. Continue current regimen  PLAN:    In order of problems listed above:  BMP Start atorvastatin 20 mg daily Follow up 6 months      Medication Adjustments/Labs and Tests Ordered: Current medicines are reviewed at length with the patient today.  Concerns regarding medicines are outlined above.  No orders of the defined types were placed in this encounter.  No orders of the defined types were placed in this encounter.   Patient Instructions  Medication Instructions:  START: ATORVASTATIN 20mg  ONCE DAILY  *If you need a refill on your cardiac medications before your next appointment, please call your pharmacy*  Lab Work: LABS TODAY   Follow-Up: At [facility], you and your health needs are our priority.  As part of our continuing mission to provide you with exceptional heart care, we have created designated Provider Care Teams.  These Care Teams include your primary Cardiologist (physician) and Advanced Practice Providers (APPs -  Physician Assistants and Nurse Practitioners) who all work together to provide you with the care you need, when you need it.  Your next appointment:   6 month(s)  The format for your next appointment:   In Person  Provider:   [facility], MD    Other Instructions  Please obtain a blood pressure cuff (Om[employer]) at  [facility] or [employer]. Please check blood pressures for one week. If they are on average > 130/80 mmHg then let us know    Signed, Maisie Fus, MD  07/20/2022 4:01 PM    [facility]

## 2022-07-20 NOTE — Patient Instructions (Signed)
Medication Instructions:  START: ATORVASTATIN 20mg  ONCE DAILY  *If you need a refill on your cardiac medications before your next appointment, please call your pharmacy*  Lab Work: LABS TODAY   Follow-Up: At [facility], you and your health needs are our priority.  As part of our continuing mission to provide you with exceptional heart care, we have created designated Provider Care Teams.  These Care Teams include your primary Cardiologist (physician) and Advanced Practice Providers (APPs -  Physician Assistants and Nurse Practitioners) who all work together to provide you with the care you need, when you need it.  Your next appointment:   6 month(s)  The format for your next appointment:   In Person  Provider:   [facility], MD    Other Instructions  Please obtain a blood pressure cuff (Omron) at [employer] or [employer]. Please check blood pressures for one week. If they are on average > 130/80 mmHg then let Maisie Fus know

## 2022-07-21 ENCOUNTER — Telehealth: Payer: Self-pay | Admitting: Nurse Practitioner

## 2022-07-21 DIAGNOSIS — R911 Solitary pulmonary nodule: Secondary | ICD-10-CM

## 2022-07-21 LAB — BASIC METABOLIC PANEL
BUN/Creatinine Ratio: 17 (ref 10–24)
BUN: 17 mg/dL (ref 8–27)
CO2: 31 mmol/L — ABNORMAL HIGH (ref 20–29)
Calcium: 9.2 mg/dL (ref 8.6–10.2)
Chloride: 97 mmol/L (ref 96–106)
Creatinine, Ser: 1.02 mg/dL (ref 0.76–1.27)
Glucose: 99 mg/dL (ref 70–99)
Potassium: 5 mmol/L (ref 3.5–5.2)
Sodium: 143 mmol/L (ref 134–144)
eGFR: 84 mL/min/{1.73_m2} (ref >59)

## 2022-07-21 NOTE — Telephone Encounter (Signed)
CT scan has not been ordered.  Routing back to triage for order if needed.

## 2022-07-21 NOTE — Telephone Encounter (Signed)
Order placed. Nothing further needed. 

## 2022-07-31 ENCOUNTER — Telehealth: Payer: Self-pay | Admitting: Emergency Medicine

## 2022-08-01 MED ORDER — BREZTRI AEROSPHERE 160-9-4.8 MCG/ACT IN AERO: 2.0000 | INHALATION_SPRAY | Freq: Two times a day (BID) | RESPIRATORY_TRACT | 5 refills | Status: DC

## 2022-08-01 NOTE — Telephone Encounter (Signed)
Sean Hull (DPR) called requesting Breztri refill to be sent to [facility].  Breztri prescription sent to requested pharmacy.  Detailed message left for Sean Hull on VM about requested prescription being sent to pharmacy.  Nothing further at this time.

## 2022-08-03 ENCOUNTER — Telehealth: Payer: Self-pay | Admitting: Emergency Medicine

## 2022-08-03 ENCOUNTER — Ambulatory Visit (HOSPITAL_COMMUNITY)
Admission: RE | Admit: 2022-08-03 | Discharge: 2022-08-03 | Disposition: A | Discharge status: Home / Self Care | Payer: Medicaid Other | Source: Ambulatory Visit | Attending: Emergency Medicine | Admitting: Emergency Medicine

## 2022-08-03 ENCOUNTER — Other Ambulatory Visit (HOSPITAL_COMMUNITY): Payer: Self-pay

## 2022-08-03 DIAGNOSIS — R911 Solitary pulmonary nodule: Secondary | ICD-10-CM | POA: Insufficient documentation

## 2022-08-03 NOTE — Telephone Encounter (Signed)
Routing to prior auth team to see if there is anything that might be more cost effective for pt. If needed, we can also provide patient assistance paperwork for the [facility].

## 2022-08-04 MED ORDER — BREZTRI AEROSPHERE 160-9-4.8 MCG/ACT IN AERO: 2.0000 | INHALATION_SPRAY | Freq: Two times a day (BID) | RESPIRATORY_TRACT | 0 refills | Status: DC

## 2022-08-04 NOTE — Telephone Encounter (Signed)
Bryson Dames, CPhT  You Yesterday (11:50 AM)    Prices for other options for non-insured are as follows:   Trelegy $814.54  LABA+LAMA  Anoro $590.88  Stiolto $601.70  Bevespi $538.18    Attempted to call pt's sister Lynnell Dike but unable to reach. Left message for her to return call.

## 2022-08-04 NOTE — Telephone Encounter (Signed)
Spoke with the pt's sister  She is aware of response from pharm team  Still waiting on breztri pt assistance to be approved  2 more samples provided for the pt  She is asking if Dr Delton Coombes has any other suggestions

## 2022-08-04 NOTE — Telephone Encounter (Signed)
Attempted to call pt's sister back but line went directly to VM. Left message for her to return call.

## 2022-08-04 NOTE — Telephone Encounter (Signed)
Susie sister is returning phone call. Susie phone number is [phone].

## 2022-08-06 ENCOUNTER — Other Ambulatory Visit: Payer: Self-pay | Admitting: General Practice

## 2022-08-08 NOTE — Telephone Encounter (Signed)
All the options are expensive and absence of insurance coverage, often even with insurance coverage.  If he cannot get financial assistance then we may have to change to nebulized therapy.  This is often more affordable depending on which medication is selected.

## 2022-08-08 NOTE — Telephone Encounter (Signed)
Called and spoke to sister and gave her the update from Dr Delton Coombes. And told her that we are waiting to see if he can get assistance for [facility]. But if not then we will consider other options. She verbalized understanding. Nothing further needed

## 2022-08-10 ENCOUNTER — Encounter: Payer: Self-pay | Admitting: Emergency Medicine

## 2022-08-10 ENCOUNTER — Ambulatory Visit (INDEPENDENT_AMBULATORY_CARE_PROVIDER_SITE_OTHER): Payer: No Typology Code available for payment source | Admitting: Nurse Practitioner

## 2022-08-10 ENCOUNTER — Encounter: Payer: Self-pay | Admitting: Nurse Practitioner

## 2022-08-10 ENCOUNTER — Ambulatory Visit (INDEPENDENT_AMBULATORY_CARE_PROVIDER_SITE_OTHER): Payer: Self-pay | Admitting: Emergency Medicine

## 2022-08-10 DIAGNOSIS — J449 Chronic obstructive pulmonary disease, unspecified: Secondary | ICD-10-CM

## 2022-08-10 DIAGNOSIS — R9389 Abnormal findings on diagnostic imaging of other specified body structures: Secondary | ICD-10-CM

## 2022-08-10 DIAGNOSIS — Z720 Tobacco use: Secondary | ICD-10-CM

## 2022-08-10 DIAGNOSIS — I10 Essential (primary) hypertension: Secondary | ICD-10-CM

## 2022-08-10 DIAGNOSIS — R000 Tachycardia, unspecified: Secondary | ICD-10-CM

## 2022-08-10 MED ORDER — METOPROLOL SUCCINATE ER 25 MG PO TB24: 25.0000 mg | ORAL_TABLET | Freq: Every day | ORAL | 3 refills | Status: DC

## 2022-08-10 MED ORDER — ALBUTEROL SULFATE HFA 108 (90 BASE) MCG/ACT IN AERS: 2.0000 | INHALATION_SPRAY | Freq: Four times a day (QID) | RESPIRATORY_TRACT | 3 refills | Status: DC | PRN

## 2022-08-10 MED ORDER — BREZTRI AEROSPHERE 160-9-4.8 MCG/ACT IN AERO: 2.0000 | INHALATION_SPRAY | Freq: Two times a day (BID) | RESPIRATORY_TRACT | 0 refills | Status: DC

## 2022-08-10 MED ORDER — PREDNISONE 10 MG PO TABS
20.0000 mg | ORAL_TABLET | Freq: Every day | ORAL | 0 refills | Status: DC
Start: 2022-08-10 — End: 2022-09-12

## 2022-08-10 MED ORDER — TORSEMIDE 20 MG PO TABS: ORAL_TABLET | ORAL | 3 refills | Status: DC

## 2022-08-10 NOTE — Progress Notes (Signed)
@Patient  ID: , male    DOB: 1961/01/31, 61 y.o.   MRN: [id number]  Chief Complaint  Patient presents with   Follow-up    Pt is here for HTN follow up visit. Pt is requesting refill torsemide,metoprolol succinate, albuterol inhaler    Referring provider: [id number], NP   HPI Sean Hull 61 y.o. male  has a past medical history of Acute respiratory failure with hypoxia (HCC), Anxiety, Arthritis, CAP (community acquired pneumonia), Cataract, COPD (chronic obstructive pulmonary disease) (HCC), Dyspnea, Emphysema of lung (HCC), GERD (gastroesophageal reflux disease), Hypertension, Oxygen deficiency, Pneumonia, Substance use disorder, and Tobacco use. To the [facility] for reevaluation of HTN.  Patient presents for a follow up on hypertension. Taking blood pressure at home. States that blood pressure has been within normal range at home. Compliant with medications. No new issues or concerns today. Denies f/c/s, n/v/d, hemoptysis, PND, leg swelling Denies chest pain or edema   No Known Allergies  Immunization History  Administered Date(s) Administered   Influenza,inj,Quad PF,6+ Mos 11/10/2019, 12/29/2021   Influenza-Unspecified 11/10/2019   Pneumococcal Polysaccharide-23 11/10/2019    Past Medical History:  Diagnosis Date   Acute respiratory failure with hypoxia (HCC)    Anxiety    Arthritis    hands   CAP (community acquired pneumonia)    Cataract    bilateral-removed   COPD (chronic obstructive pulmonary disease) (HCC)    Dyspnea    with exertion   Emphysema of lung (HCC)    GERD (gastroesophageal reflux disease)    Hypertension    Oxygen deficiency    Pneumonia    Substance use disorder    pt.denies as of 01/25/22   Tobacco use     Tobacco History: Social History   Tobacco Use  Smoking Status Some Days   Packs/day: 1.50   Years: 40.00   Total pack years: 60.00   Types: Cigarettes   Passive exposure: Current  Smokeless Tobacco Never  Tobacco  Comments   0.5 pack smoked daily ARJ 08/10/22   Ready to quit: Not Answered Counseling given: Not Answered Tobacco comments: 0.5 pack smoked daily ARJ 08/10/22   Outpatient Encounter Medications as of 08/10/2022  Medication Sig   albuterol (PROVENTIL) (2.5 MG/3ML) 0.083% nebulizer solution Take 3 mLs (2.5 mg total) by nebulization every 6 (six) hours as needed for wheezing or shortness of breath.   atorvastatin (LIPITOR) 20 MG tablet Take 1 tablet (20 mg total) by mouth daily.   Budeson-Glycopyrrol-Formoterol (BREZTRI AEROSPHERE) 160-9-4.8 MCG/ACT AERO Inhale 2 puffs into the lungs in the morning and at bedtime.   Budeson-Glycopyrrol-Formoterol (BREZTRI AEROSPHERE) 160-9-4.8 MCG/ACT AERO Inhale 2 puffs into the lungs in the morning and at bedtime.   furosemide (LASIX) 40 MG tablet Take 1 tablet (40 mg total) by mouth 2 (two) times daily.   [DISCONTINUED] albuterol (VENTOLIN HFA) 108 (90 Base) MCG/ACT inhaler Inhale 2 puffs into the lungs every 6 (six) hours as needed for wheezing or shortness of breath.   [DISCONTINUED] metoprolol succinate (TOPROL-XL) 25 MG 24 hr tablet Take 1 tablet (25 mg total) by mouth daily.   [DISCONTINUED] torsemide (DEMADEX) 20 MG tablet TAKE 1 TABLET(20 MG) BY MOUTH DAILY   acetaminophen (TYLENOL) 325 MG tablet Take 650 mg by mouth every 6 (six) hours as needed.   albuterol (VENTOLIN HFA) 108 (90 Base) MCG/ACT inhaler Inhale 2 puffs into the lungs every 6 (six) hours as needed for wheezing or shortness of breath.   metoprolol succinate (TOPROL-XL) 25 MG  24 hr tablet Take 1 tablet (25 mg total) by mouth daily.   nicotine polacrilex (NICOTINE MINI) 4 MG lozenge Take 1 lozenge (4 mg total) by mouth as needed for smoking cessation. (Patient not taking: Reported on 08/10/2022)   OXYGEN Inhale 3-4 L into the lungs.   pantoprazole (PROTONIX) 40 MG tablet Take 1 tablet (40 mg total) by mouth daily. Take 30 minutes before breakfast (Patient not taking: Reported on 08/10/2022)    potassium chloride SA (KLOR-CON M) 20 MEQ tablet Take 1 tablet (20 mEq total) by mouth daily. (Patient not taking: Reported on 08/10/2022)   torsemide (DEMADEX) 20 MG tablet TAKE 1 TABLET(20 MG) BY MOUTH DAILY   No facility-administered encounter medications on file as of 08/10/2022.     Review of Systems  Review of Systems  Constitutional: Negative.   HENT: Negative.    Cardiovascular: Negative.   Gastrointestinal: Negative.   Allergic/Immunologic: Negative.   Neurological: Negative.   Psychiatric/Behavioral: Negative.         Physical Exam  BP (!) 158/98   Pulse 84   Temp (!) 97.3 F (36.3 C)   Ht 5\' 11"  (1.803 m)   Wt 143 lb 3.2 oz (65 kg)   SpO2 91%   BMI 19.97 kg/m   Wt Readings from Last 5 Encounters:  08/10/22 143 lb 3.2 oz (65 kg)  08/10/22 143 lb 6.4 oz (65 kg)  07/20/22 137 lb 3.2 oz (62.2 kg)  06/21/22 141 lb 12.8 oz (64.3 kg)  05/17/22 146 lb (66.2 kg)     Physical Exam Vitals and nursing note reviewed.  Constitutional:      General: He is not in acute distress.    Appearance: He is well-developed.  Cardiovascular:     Rate and Rhythm: Normal rate and regular rhythm.  Pulmonary:     Effort: Pulmonary effort is normal.     Breath sounds: Normal breath sounds.  Skin:    General: Skin is warm and dry.  Neurological:     Mental Status: He is alert and oriented to person, place, and time.      Lab Results:  CBC    Component Value Date/Time   WBC 8.3 05/17/2022 1547   WBC 10.5 04/17/2022 1517   RBC 4.56 05/17/2022 1547   RBC 4.44 04/17/2022 1517   HGB 13.5 05/17/2022 1547   HCT 42.0 05/17/2022 1547   PLT 261 05/17/2022 1547   MCV 92 05/17/2022 1547   MCH 29.6 05/17/2022 1547   MCH 30.7 12/27/2021 0330   MCHC 32.1 05/17/2022 1547   MCHC 31.2 04/17/2022 1517   RDW 15.8 (H) 05/17/2022 1547   LYMPHSABS 0.6 (L) 04/17/2022 1517   LYMPHSABS 1.1 01/06/2022 1446   MONOABS 0.6 04/17/2022 1517   EOSABS 0.0 04/17/2022 1517   EOSABS 0.0  01/06/2022 1446   BASOSABS 0.0 04/17/2022 1517   BASOSABS 0.1 01/06/2022 1446    BMET    Component Value Date/Time   NA 143 07/20/2022 1615   K 5.0 07/20/2022 1615   CL 97 07/20/2022 1615   CO2 31 (H) 07/20/2022 1615   GLUCOSE 99 07/20/2022 1615   GLUCOSE 68 (L) 04/27/2022 1655   BUN 17 07/20/2022 1615   CREATININE 1.02 07/20/2022 1615   CALCIUM 9.2 07/20/2022 1615   GFRNONAA >60 12/29/2021 0343   GFRAA >60 02/10/2019 1138    BNP    Component Value Date/Time   BNP 1,868.7 (H) 12/26/2021 1056    ProBNP    Component Value  Date/Time   PROBNP 1,338.0 (H) 04/27/2022 1655    Imaging: CT Super D Chest Wo Contrast  Result Date: 08/04/2022 CLINICAL DATA:  Lung nodule. EXAM: CT CHEST WITHOUT CONTRAST TECHNIQUE: Multidetector CT imaging of the chest was performed using thin slice collimation for electromagnetic bronchoscopy planning purposes, without intravenous contrast. RADIATION DOSE REDUCTION: This exam was performed according to the departmental dose-optimization program which includes automated exposure control, adjustment of the mA and/or kV according to patient size and/or use of iterative reconstruction technique. COMPARISON:  04/19/2022, 12/06/2021, 07/21/2021 and 01/19/2020. FINDINGS: Cardiovascular: Atherosclerotic calcification of the aorta and coronary arteries. Enlarged pulmonic trunk. Heart size normal. No pericardial effusion. Mediastinum/Nodes: Mediastinal lymph nodes measure up to 10 mm in the low left paratracheal station similar. Hilar regions are difficult to evaluate without IV contrast. No axillary adenopathy. There may be lower esophageal wall thickening which can be seen with gastroesophageal reflux. Lungs/Pleura: Severe centrilobular emphysema with pleuroparenchymal scarring in the apices, left greater than right, as before. Additional scattered pulmonary parenchymal scarring bilaterally. 10 mm posterolateral right lower lobe nodule (5/117) is unchanged from  01/19/2020 and considered benign. No follow-up necessary. No new pulmonary nodules. No pleural fluid. Debris is seen in the airway. Upper Abdomen: Visualized portion of the liver is unremarkable. 1.4 cm right adrenal nodule measures 1 Hounsfield unit. No follow-up necessary. Visualized portions of the left adrenal gland, kidneys, spleen, pancreas, stomach and bowel are grossly unremarkable. Musculoskeletal: Degenerative changes in the spine. No worrisome lytic or sclerotic lesions. IMPRESSION: 1. No suspicious pulmonary nodules. 2. Right adrenal adenoma. 3. Persistent distal esophageal thickening. Please correlate clinically. 4. Aortic atherosclerosis (ICD10-I70.0). Coronary artery calcification. 5. Enlarged pulmonic trunk, indicative of pulmonary arterial hypertension. 6.  Emphysema (ICD10-J43.9). Electronically Signed   By: Leanna Battles M.D.   On: 08/04/2022 13:18     Assessment & Plan:   Tachycardia - metoprolol succinate (TOPROL-XL) 25 MG 24 hr tablet; Take 1 tablet (25 mg total) by mouth daily.  Dispense: 90 tablet; Refill: 3  2. Primary hypertension  - torsemide (DEMADEX) 20 MG tablet; TAKE 1 TABLET(20 MG) BY MOUTH DAILY  Dispense: 90 tablet; Refill: 3 - metoprolol succinate (TOPROL-XL) 25 MG 24 hr tablet; Take 1 tablet (25 mg total) by mouth daily.  Dispense: 90 tablet; Refill: 3   Follow up:  Follow up in 3 months or sooner if needed     Ivonne Andrew, NP 08/10/2022

## 2022-08-10 NOTE — Patient Instructions (Addendum)
We reviewed your CT scan of the chest today.  The pneumonia has resolved and your pulmonary nodule is stable going back to 01/2020.  This is good news. We will enroll you in the lung cancer screening program.  Your initial screening CT scan should be done in August 2024. Please continue Breztri 2 puffs twice a day.  Rinse and gargle after using. Keep albuterol available to use either 2 puffs or 1 nebulizer treatment when you needed for shortness of breath, chest tightness, wheezing. Please start prednisone 20 mg once daily.  Depending on how you respond to this medication we we will work on trying to decrease it to the lowest effective dose. Work on decreasing your cigarettes.  Our goal is for you to be smoking less than 10 cigarettes daily by your next visit. Follow with Dr. Delton Coombes or APP in 1 month to discuss adjusting the prednisone.

## 2022-08-10 NOTE — Assessment & Plan Note (Signed)
His pulmonary nodule stable going back to 01/2020.  Can be deemed benign.  He remains high risk.  He is willing to enter the lung cancer screening program and I will refer him.  His next scan will need to be in August 2024.

## 2022-08-10 NOTE — Patient Instructions (Addendum)
1. Tachycardia  - metoprolol succinate (TOPROL-XL) 25 MG 24 hr tablet; Take 1 tablet (25 mg total) by mouth daily.  Dispense: 90 tablet; Refill: 3  2. Primary hypertension  - torsemide (DEMADEX) 20 MG tablet; TAKE 1 TABLET(20 MG) BY MOUTH DAILY  Dispense: 90 tablet; Refill: 3 - metoprolol succinate (TOPROL-XL) 25 MG 24 hr tablet; Take 1 tablet (25 mg total) by mouth daily.  Dispense: 90 tablet; Refill: 3   Follow up:  Follow up in 3 months or sooner if needed

## 2022-08-10 NOTE — Addendum Note (Signed)
Addended by: Dorisann Frames R on: 08/10/2022 01:45 PM   Modules accepted: Orders

## 2022-08-10 NOTE — Assessment & Plan Note (Signed)
Very severe COPD.  Continues to smoke.  I think we should try starting daily prednisone to see if he gets benefit.  We will start 20 mg and try to decrease him to the lowest effective dose.  I will have him see an APP in a month to consider dropping to 15 or 10 mg if possible.   Please continue Breztri 2 puffs twice a day.  Rinse and gargle after using. Keep albuterol available to use either 2 puffs or 1 nebulizer treatment when you needed for shortness of breath, chest tightness, wheezing. Please start prednisone 20 mg once daily.  Depending on how you respond to this medication we we will work on trying to decrease it to the lowest effective dose. Follow with Dr. Delton Coombes or APP in 1 month to discuss adjusting the prednisone.

## 2022-08-10 NOTE — Assessment & Plan Note (Signed)
-   metoprolol succinate (TOPROL-XL) 25 MG 24 hr tablet; Take 1 tablet (25 mg total) by mouth daily.  Dispense: 90 tablet; Refill: 3  2. Primary hypertension  - torsemide (DEMADEX) 20 MG tablet; TAKE 1 TABLET(20 MG) BY MOUTH DAILY  Dispense: 90 tablet; Refill: 3 - metoprolol succinate (TOPROL-XL) 25 MG 24 hr tablet; Take 1 tablet (25 mg total) by mouth daily.  Dispense: 90 tablet; Refill: 3   Follow up:  Follow up in 3 months or sooner if needed

## 2022-08-10 NOTE — Progress Notes (Signed)
Subjective:    Patient ID: Sean Hull, male    DOB: [birth year], 61 y.o.   MRN: [id number]  HPI  Acute OV 07/07/21 --61 year old active smoker with history of substance abuse, prior EBUS for lymphadenopathy that was negative and resolved (treated for pneumonia).  Has a stable pulmonary nodule in the right lower lobe 9 mm, last imaged 04/07/2020. He began to experience more SOB, wheeze. Some cough but mainly when he wakes up in the am. A lot of throat mucous.  I treated him with prednisone taper and doxycycline on 8/1.  Today he reports that he is benefiting some - less LE edema, less exertional SOB. He is using albuterol 2-3x a day. Stopped his Trelegy about 3 months ago, wasn't sure it was helping him much. He is smoking about 10 cig a day.    ROV 06/21/22 --follow-up visit 61 year old gentleman with history of active tobacco use (60 pack years), substance abuse.  I follow him for lymphadenopathy and a right lower lobe pulmonary nodule on chest imaging, severe COPD.  Last seen in our office 04/27/2022.  He also has a history of hypertension with diastolic CHF, CAD. He had a right lower lobe and right middle lobe pneumonia noted on CT chest 04/19/2022, some thickening of the esophagus, mild mediastinal right hilar adenopathy. He reports that he is using a single nebulizer treatment daily but he does not know which medication this is. He has been on yupelri, brovana, pulmicort in the past - ? What he is using. He uses albuterol HFA several times a day. Smoking 0.5pk/day. O2 at 2L/min. Occasional cough, minimal sputum, no blood.   ROV 08/10/2022 --61 year old man with a history of active tobacco use (60 pack years and substance abuse.  He and his significant COPD, hypertension with diastolic CHF, CAD.  He also had an abnormal CT scan of the chest following a right lower lobe and right middle lobe pneumonias, 9 mm right lower lobe pulmonary nodule.  At his last visit in July I restarted his Markus Daft and  planned to repeat his CT chest to follow for resolution of his pneumonia and also the pulmonary nodule. He is on [facility], is still very symptomatic. Exertional SOB. Minimal cough.  His O2 is 3-4L/min.   CT chest 08/03/2022 reviewed by me, shows severe centrilobular emphysema and apical scarring left greater than right.  The 10 mm right lower lobe pulmonary nodule is unchanged going back to 01/19/2020.  His infiltrates have resolved.  There is persistent distal esophageal thickening    Review of Systems Past Medical History:  Diagnosis Date   Acute respiratory failure with hypoxia (HCC)    Anxiety    Arthritis    hands   CAP (community acquired pneumonia)    Cataract    bilateral-removed   COPD (chronic obstructive pulmonary disease) (HCC)    Dyspnea    with exertion   Emphysema of lung (HCC)    GERD (gastroesophageal reflux disease)    Hypertension    Oxygen deficiency    Pneumonia    Substance use disorder    pt.denies as of 01/25/22   Tobacco use      Family History  Problem Relation Age of Onset   Diabetes Mother    Heart disease Mother    Hypertension Mother    Hypertension Father    Colon cancer Neg Hx    Esophageal cancer Neg Hx    Colon polyps Neg Hx    Rectal cancer Neg Hx  Stomach cancer Neg Hx      Social History   Socioeconomic History   Marital status: Widowed    Spouse name: Not on file   Number of children: Not on file   Years of education: Not on file   Highest education level: Not on file  Occupational History   Not on file  Tobacco Use   Smoking status: Some Days    Packs/day: 1.50    Years: 40.00    Total pack years: 60.00    Types: Cigarettes    Passive exposure: Current   Smokeless tobacco: Never   Tobacco comments:    0.5 pack smoked daily ARJ 08/10/22  Vaping Use   Vaping Use: Never used  Substance and Sexual Activity   Alcohol use: Yes    Alcohol/week: 6.0 standard drinks of alcohol    Types: 6 Cans of beer per week    Comment:  per week   Drug use: Not Currently    Types: "Crack" cocaine    Comment: crack - last time  1 month or so ago- it was days before hospitalization in [place], 01/25/2019.   Sexual activity: Yes  Other Topics Concern   Not on file  Social History Narrative   Not on file   Social Determinants of Health   Financial Resource Strain: Not on file  Food Insecurity: Not on file  Transportation Needs: Not on file  Physical Activity: Not on file  Stress: Not on file  Social Connections: Not on file  Intimate Partner Violence: Not on file     No Known Allergies   Outpatient Medications Prior to Visit  Medication Sig Dispense Refill   acetaminophen (TYLENOL) 325 MG tablet Take 650 mg by mouth every 6 (six) hours as needed.     albuterol (PROVENTIL) (2.5 MG/3ML) 0.083% nebulizer solution Take 3 mLs (2.5 mg total) by nebulization every 6 (six) hours as needed for wheezing or shortness of breath. 75 mL 12   albuterol (VENTOLIN HFA) 108 (90 Base) MCG/ACT inhaler Inhale 2 puffs into the lungs every 6 (six) hours as needed for wheezing or shortness of breath. 18 g 3   atorvastatin (LIPITOR) 20 MG tablet Take 1 tablet (20 mg total) by mouth daily. 90 tablet 3   Budeson-Glycopyrrol-Formoterol (BREZTRI AEROSPHERE) 160-9-4.8 MCG/ACT AERO Inhale 2 puffs into the lungs in the morning and at bedtime. 10.7 g 5   furosemide (LASIX) 40 MG tablet Take 1 tablet (40 mg total) by mouth 2 (two) times daily. 60 tablet 3   metoprolol succinate (TOPROL-XL) 25 MG 24 hr tablet Take 1 tablet (25 mg total) by mouth daily. 90 tablet 3   nicotine polacrilex (NICOTINE MINI) 4 MG lozenge Take 1 lozenge (4 mg total) by mouth as needed for smoking cessation. 100 tablet 0   OXYGEN Inhale 3-4 L into the lungs.     pantoprazole (PROTONIX) 40 MG tablet Take 1 tablet (40 mg total) by mouth daily. Take 30 minutes before breakfast 90 tablet 4   potassium chloride SA (KLOR-CON M) 20 MEQ tablet Take 1 tablet (20 mEq total) by mouth  daily. 60 tablet 3   torsemide (DEMADEX) 20 MG tablet TAKE 1 TABLET(20 MG) BY MOUTH DAILY 90 tablet 3   Budeson-Glycopyrrol-Formoterol (BREZTRI AEROSPHERE) 160-9-4.8 MCG/ACT AERO Inhale 2 puffs into the lungs in the morning and at bedtime. 5.9 g 0   No facility-administered medications prior to visit.        Objective:   Physical Exam  Vitals:  08/10/22 1215  BP: 130/80  Pulse: 91  Temp: 98.2 F (36.8 C)  TempSrc: Oral  SpO2: 93%  Weight: 143 lb 6.4 oz (65 kg)  Height: 5\' 11"  (1.803 m)   Gen: Pleasant, very thin, in no distress,  normal affect  ENT: No lesions,  mouth clear,  no postnasal drip  Neck: No JVD, no stridor  Lungs: No use of accessory muscles, distant, bilateral coarse breath sounds with end expiratory wheeze  Cardiovascular: RRR, heart sounds normal, no murmur or gallops, no peripheral edema  Musculoskeletal: No deformities, no cyanosis or clubbing  Neuro: alert, awake, non focal  Skin: Warm, no lesions or rash     Assessment & Plan:  Abnormal CT of the chest His pulmonary nodule stable going back to 01/2020.  Can be deemed benign.  He remains high risk.  He is willing to enter the lung cancer screening program and I will refer him.  His next scan will need to be in August 2024.  COPD, very severe (HCC) Very severe COPD.  Continues to smoke.  I think we should try starting daily prednisone to see if he gets benefit.  We will start 20 mg and try to decrease him to the lowest effective dose.  I will have him see an APP in a month to consider dropping to 15 or 10 mg if possible.   Please continue Breztri 2 puffs twice a day.  Rinse and gargle after using. Keep albuterol available to use either 2 puffs or 1 nebulizer treatment when you needed for shortness of breath, chest tightness, wheezing. Please start prednisone 20 mg once daily.  Depending on how you respond to this medication we we will work on trying to decrease it to the lowest effective  dose. Follow with Dr. September 2024 or APP in 1 month to discuss adjusting the prednisone.  Tobacco abuse Work on decreasing your cigarettes.  Our goal is for you to be smoking less than 10 cigarettes daily by your next visit.  Delton Coombes, MD, PhD 08/10/2022, 12:38 PM [facility] [phone] or if no answer [phone]

## 2022-08-10 NOTE — Assessment & Plan Note (Signed)
Work on decreasing your cigarettes.  Our goal is for you to be smoking less than 10 cigarettes daily by your next visit.

## 2022-09-11 ENCOUNTER — Other Ambulatory Visit: Payer: Self-pay | Admitting: Emergency Medicine

## 2022-09-20 ENCOUNTER — Encounter: Payer: Self-pay | Admitting: Emergency Medicine

## 2022-09-20 ENCOUNTER — Ambulatory Visit (INDEPENDENT_AMBULATORY_CARE_PROVIDER_SITE_OTHER): Payer: Self-pay | Admitting: Emergency Medicine

## 2022-09-20 VITALS — BP 160/92 | HR 99 | Ht 71.0 in | Wt 148.2 lb

## 2022-09-20 DIAGNOSIS — R9389 Abnormal findings on diagnostic imaging of other specified body structures: Secondary | ICD-10-CM

## 2022-09-20 DIAGNOSIS — Z720 Tobacco use: Secondary | ICD-10-CM

## 2022-09-20 DIAGNOSIS — J449 Chronic obstructive pulmonary disease, unspecified: Secondary | ICD-10-CM

## 2022-09-20 DIAGNOSIS — J9611 Chronic respiratory failure with hypoxia: Secondary | ICD-10-CM

## 2022-09-20 DIAGNOSIS — Z23 Encounter for immunization: Secondary | ICD-10-CM

## 2022-09-20 MED ORDER — PREDNISONE 5 MG PO TABS: 5.0000 mg | ORAL_TABLET | Freq: Every day | ORAL | 0 refills | Status: DC

## 2022-09-20 NOTE — Progress Notes (Signed)
F

## 2022-09-20 NOTE — Addendum Note (Signed)
Addended by: Monna Fam L on: 09/20/2022 03:02 PM   Modules accepted: Orders

## 2022-09-20 NOTE — Progress Notes (Signed)
Subjective:    Patient ID: Sean Hull, male    DOB: August 20, 1961, 61 y.o.   MRN: [id number]  HPI  ROV 08/10/2022 --61 year old man with a history of active tobacco use (60 pack years and substance abuse.  He and his significant COPD, hypertension with diastolic CHF, CAD.  He also had an abnormal CT scan of the chest following a right lower lobe and right middle lobe pneumonias, 9 mm right lower lobe pulmonary nodule.  At his last visit in July I restarted his Judithann Sauger and planned to repeat his CT chest to follow for resolution of his pneumonia and also the pulmonary nodule. He is on [place], is still very symptomatic. Exertional SOB. Minimal cough.  His O2 is 3-4L/min.   CT chest 08/03/2022 reviewed by me, shows severe centrilobular emphysema and apical scarring left greater than right.  The 10 mm right lower lobe pulmonary nodule is unchanged going back to 01/19/2020.  His infiltrates have resolved.  There is persistent distal esophageal thickening  ROV 09/20/22 --follow-up visit for 61 year old gentleman with a history of tobacco use, substance abuse.  I saw him originally in the aftermath of a community-acquired pneumonia.  He had negative EBUS on 02/12/2019 for associated lymphadenopathy.  We have been following pulmonary nodular disease with serial CT imaging, over 2 years of stability on his most recent CT from 08/03/2022.  He is currently managed on Breztri.  He continued to have severe symptoms, bronchitic symptoms and I started him on prednisone at his last visit 1 month ago to see if he will get benefit.  He returns today to discuss. He continues to smoke.  We set a goal last time for him to get down to 10 cigarettes daily by this visit.  Still smoking more than 1/2 pk daily. He reports that his energy has benefited some. Hasn't done much for his overall breathing. Increased appetite, some decreased sleep. O2 is at 3-4L/min.  Flu shot today     Review of Systems Past Medical History:   Diagnosis Date   Acute respiratory failure with hypoxia ([place])    Anxiety    Arthritis    hands   CAP (community acquired pneumonia)    Cataract    bilateral-removed   COPD (chronic obstructive pulmonary disease) (HCC)    Dyspnea    with exertion   Emphysema of lung (HCC)    GERD (gastroesophageal reflux disease)    Hypertension    Oxygen deficiency    Pneumonia    Substance use disorder    pt.denies as of 01/25/22   Tobacco use      Family History  Problem Relation Age of Onset   Diabetes Mother    Heart disease Mother    Hypertension Mother    Hypertension Father    Colon cancer Neg Hx    Esophageal cancer Neg Hx    Colon polyps Neg Hx    Rectal cancer Neg Hx    Stomach cancer Neg Hx      Social History   Socioeconomic History   Marital status: Widowed    Spouse name: Not on file   Number of children: Not on file   Years of education: Not on file   Highest education level: Not on file  Occupational History   Not on file  Tobacco Use   Smoking status: Some Days    Packs/day: 1.50    Years: 40.00    Total pack years: 60.00    Types: Cigarettes  Passive exposure: Current   Smokeless tobacco: Never   Tobacco comments:    0.5 pack smoked daily PAP 09/20/2022  Vaping Use   Vaping Use: Never used  Substance and Sexual Activity   Alcohol use: Yes    Alcohol/week: 6.0 standard drinks of alcohol    Types: 6 Cans of beer per week    Comment: per week   Drug use: Not Currently    Types: "Crack" cocaine    Comment: crack - last time  1 month or so ago- it was days before hospitalization in [place], 01/25/2019.   Sexual activity: Yes  Other Topics Concern   Not on file  Social History Narrative   Not on file   Social Determinants of Health   Financial Resource Strain: Not on file  Food Insecurity: Not on file  Transportation Needs: Not on file  Physical Activity: Not on file  Stress: Not on file  Social Connections: Not on file  Intimate Partner  Violence: Not on file     No Known Allergies   Outpatient Medications Prior to Visit  Medication Sig Dispense Refill   acetaminophen (TYLENOL) 325 MG tablet Take 650 mg by mouth every 6 (six) hours as needed.     albuterol (PROVENTIL) (2.5 MG/3ML) 0.083% nebulizer solution Take 3 mLs (2.5 mg total) by nebulization every 6 (six) hours as needed for wheezing or shortness of breath. 75 mL 12   albuterol (VENTOLIN HFA) 108 (90 Base) MCG/ACT inhaler Inhale 2 puffs into the lungs every 6 (six) hours as needed for wheezing or shortness of breath. 18 g 3   atorvastatin (LIPITOR) 20 MG tablet Take 1 tablet (20 mg total) by mouth daily. 90 tablet 3   Budeson-Glycopyrrol-Formoterol (BREZTRI AEROSPHERE) 160-9-4.8 MCG/ACT AERO Inhale 2 puffs into the lungs in the morning and at bedtime. 5.9 g 0   metoprolol succinate (TOPROL-XL) 25 MG 24 hr tablet Take 1 tablet (25 mg total) by mouth daily. 90 tablet 3   OXYGEN Inhale 3-4 L into the lungs.     predniSONE (DELTASONE) 10 MG tablet TAKE 2 TABLETS(20 MG) BY MOUTH DAILY WITH BREAKFAST 60 tablet 0   torsemide (DEMADEX) 20 MG tablet TAKE 1 TABLET(20 MG) BY MOUTH DAILY 90 tablet 3   furosemide (LASIX) 40 MG tablet Take 1 tablet (40 mg total) by mouth 2 (two) times daily. 60 tablet 3   Budeson-Glycopyrrol-Formoterol (BREZTRI AEROSPHERE) 160-9-4.8 MCG/ACT AERO Inhale 2 puffs into the lungs in the morning and at bedtime. 10.7 g 5   Budeson-Glycopyrrol-Formoterol (BREZTRI AEROSPHERE) 160-9-4.8 MCG/ACT AERO Inhale 2 puffs into the lungs in the morning and at bedtime. 5.9 g 0   nicotine polacrilex (NICOTINE MINI) 4 MG lozenge Take 1 lozenge (4 mg total) by mouth as needed for smoking cessation. (Patient not taking: Reported on 08/10/2022) 100 tablet 0   pantoprazole (PROTONIX) 40 MG tablet Take 1 tablet (40 mg total) by mouth daily. Take 30 minutes before breakfast (Patient not taking: Reported on 08/10/2022) 90 tablet 4   potassium chloride SA (KLOR-CON M) 20 MEQ tablet Take  1 tablet (20 mEq total) by mouth daily. (Patient not taking: Reported on 08/10/2022) 60 tablet 3   No facility-administered medications prior to visit.        Objective:   Physical Exam  Vitals:   09/20/22 1439  BP: (!) 160/92  Pulse: 99  SpO2: 93%  Weight: 148 lb 3.2 oz (67.2 kg)  Height: 5\' 11"  (1.803 m)   Gen: Pleasant, very thin,  in no distress,  normal affect  ENT: No lesions,  mouth clear,  no postnasal drip  Neck: No JVD, no stridor  Lungs: No use of accessory muscles, distant, bilateral coarse breath sounds with end expiratory wheeze  Cardiovascular: RRR, heart sounds normal, no murmur or gallops, no peripheral edema  Musculoskeletal: No deformities, no cyanosis or clubbing  Neuro: alert, awake, non focal  Skin: Warm, no lesions or rash     Assessment & Plan:  COPD, very severe (HCC) Some benefit from the prednisone at least with regard to his energy level.  Functional capacity is slightly better.  We will try decreasing to 15 mg daily and stay at that dose.  Continue the rest of his regimen.  Discussed smoking cessation with him today.    Please continue Breztri 2 puffs twice a day.  Rinse and gargle after using. Keep your albuterol available to use either 2 puffs or 1 nebulizer treatment when you needed for shortness of breath, chest tightness, wheezing. We will decrease your prednisone to 15 mg daily.  Stay on this dose until we follow-up Flu shot today You would benefit from getting the COVID-19 vaccine this fall Follow with APP in 3 months or sooner if you have any problems. Follow Dr. Delton Coombes in 6 months or sooner if you have any problems.  Chronic respiratory failure with hypoxia (HCC) Continue oxygen at 3-4 L/min depending on level of exertion  Abnormal CT of the chest Pulmonary nodule stable for over 2 years, deemed benign.  We will consider referring him to the lung cancer screening program going forward.  Given the severity of his disease he may not  be a good candidate for any diagnostics if a nodule was found.  Tobacco abuse Still smoking over 10 cigarettes daily.  We spoke again about our goal which is to get down to half a pack daily.  He is going to work on this.  Once he achieves it we will continue to try to cut down.  Levy Pupa, MD, PhD 09/20/2022, 2:56 PM [facility] [phone] or if no answer [phone]

## 2022-09-20 NOTE — Assessment & Plan Note (Signed)
Still smoking over 10 cigarettes daily.  We spoke again about our goal which is to get down to half a pack daily.  He is going to work on this.  Once he achieves it we will continue to try to cut down.

## 2022-09-20 NOTE — Assessment & Plan Note (Signed)
Continue oxygen at 3-4 L/min depending on level of exertion

## 2022-09-20 NOTE — Assessment & Plan Note (Signed)
Pulmonary nodule stable for over 2 years, deemed benign.  We will consider referring him to the lung cancer screening program going forward.  Given the severity of his disease he may not be a good candidate for any diagnostics if a nodule was found.

## 2022-09-20 NOTE — Assessment & Plan Note (Signed)
Some benefit from the prednisone at least with regard to his energy level.  Functional capacity is slightly better.  We will try decreasing to 15 mg daily and stay at that dose.  Continue the rest of his regimen.  Discussed smoking cessation with him today.    Please continue Breztri 2 puffs twice a day.  Rinse and gargle after using. Keep your albuterol available to use either 2 puffs or 1 nebulizer treatment when you needed for shortness of breath, chest tightness, wheezing. We will decrease your prednisone to 15 mg daily.  Stay on this dose until we follow-up Flu shot today You would benefit from getting the COVID-19 vaccine this fall Follow with APP in 3 months or sooner if you have any problems. Follow Dr. Lamonte Sakai in 6 months or sooner if you have any problems.

## 2022-09-20 NOTE — Patient Instructions (Signed)
Please continue Breztri 2 puffs twice a day.  Rinse and gargle after using. Keep your albuterol available to use either 2 puffs or 1 nebulizer treatment when you needed for shortness of breath, chest tightness, wheezing. We will decrease your prednisone to 15 mg daily.  Stay on this dose until we follow-up Flu shot today You would benefit from getting the COVID-19 vaccine this fall Continue your oxygen at 3-4 L/min depending on your level of exertion. Follow with APP in 3 months or sooner if you have any problems. Follow Dr. Lamonte Sakai in 6 months or sooner if you have any problems.

## 2022-10-10 ENCOUNTER — Telehealth: Payer: Self-pay | Admitting: Emergency Medicine

## 2022-10-10 MED ORDER — BREZTRI AEROSPHERE 160-9-4.8 MCG/ACT IN AERO: 2.0000 | INHALATION_SPRAY | Freq: Two times a day (BID) | RESPIRATORY_TRACT | 4 refills | Status: DC

## 2022-10-10 NOTE — Telephone Encounter (Signed)
Called and spoke to patient and he states he is needing refills on Breztri. Verified pharmacy with him over the phone. Refills sent in. Nothing further needed

## 2022-10-12 ENCOUNTER — Telehealth: Payer: Self-pay | Admitting: Emergency Medicine

## 2022-10-12 NOTE — Telephone Encounter (Signed)
Called and spoke with patients sister and advised her that she can pick up her brothers samples of Breztri at the office. She verbalized understanding. Nothing further needed

## 2022-10-18 ENCOUNTER — Telehealth: Payer: Self-pay | Admitting: Nurse Practitioner

## 2022-10-18 NOTE — Telephone Encounter (Signed)
Sister was called back to advise he will need to be seen. KH

## 2022-10-18 NOTE — Telephone Encounter (Addendum)
Pt's sister called stating the pt is having some trouble with his stomach, wondering if something could be called in for him    NEW PHARMACY: [facility] [address] in [place]  Please call sister to let her know if this has been done so she can get it picked up for pt [phone]

## 2022-11-10 ENCOUNTER — Ambulatory Visit: Payer: Self-pay | Admitting: Nurse Practitioner

## 2022-11-14 ENCOUNTER — Telehealth: Payer: Self-pay | Admitting: Nurse Practitioner

## 2022-11-14 MED ORDER — PREDNISONE 10 MG PO TABS: 15.0000 mg | ORAL_TABLET | Freq: Every day | ORAL | 0 refills | Status: DC

## 2022-11-14 NOTE — Telephone Encounter (Signed)
Called to request a refill for patient's Prednisone.  Medication should be sent to [facility] in [place], [place]

## 2022-11-14 NOTE — Telephone Encounter (Signed)
Called sister and updated her that refills will be sent into [facility] as requested. Looked at [facility] and Dr Delton Coombes did want patient on 15mg  of prednisone until follow up. Follow up   12/21/2022  Nothing further needed

## 2022-11-30 ENCOUNTER — Telehealth: Payer: Self-pay | Admitting: Gastroenterology

## 2022-11-30 DIAGNOSIS — K219 Gastro-esophageal reflux disease without esophagitis: Secondary | ICD-10-CM

## 2022-11-30 MED ORDER — PANTOPRAZOLE SODIUM 40 MG PO TBEC: 40.0000 mg | DELAYED_RELEASE_TABLET | Freq: Every day | ORAL | 5 refills | Status: DC

## 2022-11-30 NOTE — Telephone Encounter (Signed)
Medication sent.

## 2022-11-30 NOTE — Telephone Encounter (Signed)
Patient is calling requesting a Pantoprazole refill. Please advise

## 2022-12-06 ENCOUNTER — Ambulatory Visit: Payer: Self-pay | Admitting: Nurse Practitioner

## 2022-12-10 ENCOUNTER — Other Ambulatory Visit: Payer: Self-pay | Admitting: Emergency Medicine

## 2022-12-21 ENCOUNTER — Ambulatory Visit: Payer: Medicaid Other | Admitting: Nurse Practitioner

## 2022-12-21 ENCOUNTER — Telehealth: Payer: Self-pay | Admitting: Nurse Practitioner

## 2022-12-21 ENCOUNTER — Telehealth: Payer: Self-pay

## 2022-12-21 ENCOUNTER — Encounter: Payer: Self-pay | Admitting: Nurse Practitioner

## 2022-12-21 VITALS — BP 180/102 | HR 78 | Ht 71.0 in | Wt 144.2 lb

## 2022-12-21 DIAGNOSIS — I10 Essential (primary) hypertension: Secondary | ICD-10-CM

## 2022-12-21 DIAGNOSIS — I5032 Chronic diastolic (congestive) heart failure: Secondary | ICD-10-CM | POA: Diagnosis not present

## 2022-12-21 DIAGNOSIS — J9611 Chronic respiratory failure with hypoxia: Secondary | ICD-10-CM

## 2022-12-21 DIAGNOSIS — J449 Chronic obstructive pulmonary disease, unspecified: Secondary | ICD-10-CM

## 2022-12-21 MED ORDER — BREZTRI AEROSPHERE 160-9-4.8 MCG/ACT IN AERO: 2.0000 | INHALATION_SPRAY | Freq: Two times a day (BID) | RESPIRATORY_TRACT | 0 refills | Status: DC

## 2022-12-21 NOTE — Patient Instructions (Addendum)
Continue Albuterol inhaler 2 puffs or 3 mL neb every 6 hours as needed for shortness of breath or wheezing. Notify if symptoms persist despite rescue inhaler/neb use. Continue Breztri 2 puffs Twice daily with the samples provided today. Brush tongue and rinse mouth afterwards.  Once we hear back from the pharmacy, I will send in a new prescription for an inhaler that your insurance will pay for Continue supplemental oxygen 3-4 lpm for goal >88-90% Continue pantoprazole 1 tab daily  Increase prednisone to 40 mg for the next 3 days then 30 mg for 3 days then back to your 15 mg daily. Take in AM with food  Monitor your BP at home. Goal is less than 140/90. If you remain above this, call your heart doctor to schedule an appointment to adjust your blood pressure medications. If you remain above 180/90 or you develop new headaches, nausea, vomiting, dizziness, you need to go to the emergency department.  Follow up in 6 weeks to see how new inhaler is going with Dr. Lamonte Sakai or Alanson Aly. If symptoms do not improve or worsen, please contact office for sooner follow up or seek emergency care.

## 2022-12-21 NOTE — Progress Notes (Signed)
@Patient  ID: Sean Hull, male    DOB: [birth year], 61 y.o.   MRN: [id number]  Chief Complaint  Patient presents with   Follow-up    Pt f/u for COPD/CHF he is experiencing SOB which is his typical baseline. Pt partner states he has been experiencing stomach pains.     Referring provider: Bo Merino I, NP  HPI: 62 year old male, current everyday smoker (60 pack years, 1.5 pack/day) followed for COPD with emphysema.  He is a patient of Dr. Agustina Caroli and last seen in office 09/20/2022.  Past medical history significant for mediastinal lymphadenopathy, coronary artery calcification, hypertension, CHF, history of Pseudomonas infection.  TEST/EVENTS:  12/26/2021 CTA chest: No evidence of PE.  There is mild diffuse thickening of the esophagus.  There is a left paratracheal node 1.1 cm.  Right hilar node 1.1 cm.  Both suspected to be reactive.  Clustered AP window lymph nodes.  Similar to prior.  There is biapical pleural-parenchymal scarring.  Prominent emphysema is present.  There is improved aeration in the right middle lobe, although a small amount of airspace opacity persists.  There is an 8 x 7 mm right lower lobe pulmonary nodule, slightly decreased in size from previous scan in 2020.  Some improvement of the adjacent branching nodularity in the left lower lobe previously seen.  There is upper abdominal ascites and mesenteric edema.  Substantial subcutaneous edema along the upper abdomen. 12/27/2021 echocardiogram: EF 50%.  G1 DD.  Unable to measure PASP.  Trivial MR. 04/19/2022 CTA chest: no PE. RML consolidation. Interstitial thickening. 10 mm nodule in RLL, unchanged from 2021 imaging. Reactive LAD.  08/03/2022 Super D CT chest: atherosclerosis. Enlarged pulmonic trunk. Severe emphysema. Scattered parenchymal scarring. 10 mm RLL nodule stable. No new nodules. Previous consolidation resolved. Right adrenal adenoma.   09/20/2022: OV with Dr. Lamonte Sakai. Some benefit from prednisone. Functional  capacity slightly better. Will try decreasing to 15 mg daily and stay at this dose. Smoking cessation again advised. Pulmonary nodule stable for over 2 years, deemed benign. We will consider referring him to the lung cancer screening program going forward. Given the severity of his disease he may not be a good candidate for any diagnostics if a nodule was found.   12/21/2022: Today - follow up Patient presents today for follow up with his sister. He has been doing ok since he was here last. Hasn't required any steroids or abx. No hospitalizations. He did run out of his Breztri 3 weeks ago and insurance has told him that it's no longer covered. Since then, he's felt more short winded especially over the past few days. Has noticed more wheezing too. Using his rescue multiple times a day. He has not had any change in his chronic cough. No increased chest congestion, fevers, chills, hemoptysis. He is on prednisone 15 mg daily.  He had elevated BP in office today. Upon recheck was still 180/90. Upon review of his chart, his PCP discontinued his losartan and started him on metoprolol in September. There was note of tachycardia but HR was documented at 84 and he doesn't recall this. Since then, his pressures have been running on the higher side. He hasn't been back to see cardiology. He denies headaches, N/V, dizziness, syncope, chest pain, palpitations. Admits compliance to his metoprolol and fluid pills. No leg swelling, weight gain, or orthopnea today.   No Known Allergies  Immunization History  Administered Date(s) Administered   Influenza,inj,Quad PF,6+ Mos 11/10/2019, 12/29/2021, 09/20/2022   Influenza-Unspecified 11/10/2019  Pneumococcal Polysaccharide-23 11/10/2019    Past Medical History:  Diagnosis Date   Acute respiratory failure with hypoxia (HCC)    Anxiety    Arthritis    hands   CAP (community acquired pneumonia)    Cataract    bilateral-removed   COPD (chronic obstructive pulmonary  disease) (HCC)    Dyspnea    with exertion   Emphysema of lung (HCC)    GERD (gastroesophageal reflux disease)    Hypertension    Oxygen deficiency    Pneumonia    Substance use disorder    pt.denies as of 01/25/22   Tobacco use     Tobacco History: Social History   Tobacco Use  Smoking Status Some Days   Packs/day: 1.50   Years: 40.00   Total pack years: 60.00   Types: Cigarettes   Passive exposure: Current  Smokeless Tobacco Never  Tobacco Comments   0.5-1ppd HEI 12/21/2022   Ready to quit: Not Answered Counseling given: Not Answered Tobacco comments: 0.5-1ppd HEI 12/21/2022   Outpatient Medications Prior to Visit  Medication Sig Dispense Refill   acetaminophen (TYLENOL) 325 MG tablet Take 650 mg by mouth every 6 (six) hours as needed.     albuterol (PROVENTIL) (2.5 MG/3ML) 0.083% nebulizer solution Take 3 mLs (2.5 mg total) by nebulization every 6 (six) hours as needed for wheezing or shortness of breath. 75 mL 12   albuterol (VENTOLIN HFA) 108 (90 Base) MCG/ACT inhaler Inhale 2 puffs into the lungs every 6 (six) hours as needed for wheezing or shortness of breath. 18 g 3   atorvastatin (LIPITOR) 20 MG tablet Take 1 tablet (20 mg total) by mouth daily. 90 tablet 3   metoprolol succinate (TOPROL-XL) 25 MG 24 hr tablet Take 1 tablet (25 mg total) by mouth daily. 90 tablet 3   OXYGEN Inhale 3-4 L into the lungs.     pantoprazole (PROTONIX) 40 MG tablet Take 1 tablet (40 mg total) by mouth daily. Take 30 minutes before breakfast 90 tablet 5   predniSONE (DELTASONE) 10 MG tablet TAKE 2 TABLETS(20 MG) BY MOUTH DAILY WITH BREAKFAST (Patient taking differently: Take 10 mg by mouth daily with breakfast.) 60 tablet 0   predniSONE (DELTASONE) 5 MG tablet Take 1 tablet (5 mg total) by mouth daily with breakfast. 90 tablet 0   torsemide (DEMADEX) 20 MG tablet TAKE 1 TABLET(20 MG) BY MOUTH DAILY 90 tablet 3   Budeson-Glycopyrrol-Formoterol (BREZTRI AEROSPHERE) 160-9-4.8 MCG/ACT AERO  Inhale 2 puffs into the lungs in the morning and at bedtime. (Patient not taking: Reported on 12/21/2022) 10.7 g 4   furosemide (LASIX) 40 MG tablet Take 1 tablet (40 mg total) by mouth 2 (two) times daily. 60 tablet 3   predniSONE (DELTASONE) 10 MG tablet TAKE 1.5 TABLETS (15 MG TOTAL) BY MOUTH DAILY WITH BREAKFAST. (INS MAX 30 DAYS) 45 tablet 1   No facility-administered medications prior to visit.     Review of Systems:   Constitutional: No weight loss or gain, night sweats, fevers, chills +fatigue (baseline) HEENT: No headaches, difficulty swallowing, tooth/dental problems, or sore throat. No sneezing, itching, ear ache, nasal congestion, or post nasal drip CV:  No chest pain, swelling lower extremities, orthopnea, PND, anasarca, dizziness, palpitations, syncope Resp: +shortness of breath with exertion; productive AM cough (baseline). No wheeze.   No hemoptysis.  No chest wall deformity GI:  No heartburn, indigestion, abdominal pain, nausea, vomiting, diarrhea, change in bowel habits, loss of appetite, bloody stools.  GU: No dysuria, change  in color of urine, urgency or frequency. Skin: No rash, lesions, ulcerations MSK:  No joint pain or swelling.   Neuro: No dizziness or lightheadedness.  Psych: No depression or anxiety. Mood stable.     Physical Exam:  BP (!) 180/102   Pulse 78   Ht 5\' 11"  (1.803 m)   Wt 144 lb 3.2 oz (65.4 kg)   SpO2 100%   BMI 20.11 kg/m   GEN: Pleasant, interactive, chronically-ill appearing; in no acute distress. HEENT:  Normocephalic and atraumatic. PERRLA. Sclera white. Nasal turbinates pink, moist and patent bilaterally. No rhinorrhea present. Oropharynx pink and moist, without exudate or edema. No lesions, ulcerations, or postnasal drip.  NECK:  Supple w/ fair ROM. No JVD present. Normal carotid impulses w/o bruits. Thyroid symmetrical with no goiter or nodules palpated. No lymphadenopathy.   CV: RRR, no m/r/g, peripheral edema. Pulses intact, +2  bilaterally. No cyanosis, pallor or clubbing. PULMONARY: Unlabored, regular. Diminished bases bilaterally, end expiratory wheezes bilaterally A&P. No accessory muscle use. No dullness to percussion. GI: BS present and normoactive. Soft, non-tender to palpation.  MSK: No erythema, warmth or tenderness. Cap refil <2 sec all extrem. No deformities or joint swelling noted.  Neuro: A/Ox3. No focal deficits noted.   Skin: Warm, no lesions or rashe Psych: Normal affect and behavior. Judgement and thought content appropriate.     Lab Results:  CBC    Component Value Date/Time   WBC 8.3 05/17/2022 1547   WBC 10.5 04/17/2022 1517   RBC 4.56 05/17/2022 1547   RBC 4.44 04/17/2022 1517   HGB 13.5 05/17/2022 1547   HCT 42.0 05/17/2022 1547   PLT 261 05/17/2022 1547   MCV 92 05/17/2022 1547   MCH 29.6 05/17/2022 1547   MCH 30.7 12/27/2021 0330   MCHC 32.1 05/17/2022 1547   MCHC 31.2 04/17/2022 1517   RDW 15.8 (H) 05/17/2022 1547   LYMPHSABS 0.6 (L) 04/17/2022 1517   LYMPHSABS 1.1 01/06/2022 1446   MONOABS 0.6 04/17/2022 1517   EOSABS 0.0 04/17/2022 1517   EOSABS 0.0 01/06/2022 1446   BASOSABS 0.0 04/17/2022 1517   BASOSABS 0.1 01/06/2022 1446    BMET    Component Value Date/Time   NA 143 07/20/2022 1615   K 5.0 07/20/2022 1615   CL 97 07/20/2022 1615   CO2 31 (H) 07/20/2022 1615   GLUCOSE 99 07/20/2022 1615   GLUCOSE 68 (L) 04/27/2022 1655   BUN 17 07/20/2022 1615   CREATININE 1.02 07/20/2022 1615   CALCIUM 9.2 07/20/2022 1615   GFRNONAA >60 12/29/2021 0343   GFRAA >60 02/10/2019 1138    BNP    Component Value Date/Time   BNP 1,868.7 (H) 12/26/2021 1056     Imaging:  No results found.         No data to display          No results found for: "NITRICOXIDE"      Assessment & Plan:   COPD, very severe (HCC) Very severe COPD with high symptom burden. Appears to be experiencing a mild exacerbation due to lack of maintenance therapy. We will increase his  steroids for the next week and restart him on triple therapy. I suspect we will likely have to change him to Trelegy but we will send prior auth team a message to run test claim. Provided with Breztri samples in the interim. Action plan in place.   Patient Instructions  Continue Albuterol inhaler 2 puffs or 3 mL neb every 6 hours as  needed for shortness of breath or wheezing. Notify if symptoms persist despite rescue inhaler/neb use. Continue Breztri 2 puffs Twice daily with the samples provided today. Brush tongue and rinse mouth afterwards.  Once we hear back from the pharmacy, I will send in a new prescription for an inhaler that your insurance will pay for Continue supplemental oxygen 3-4 lpm for goal >88-90% Continue pantoprazole 1 tab daily  Increase prednisone to 40 mg for the next 3 days then 30 mg for 3 days then back to your 15 mg daily. Take in AM with food  Monitor your BP at home. Goal is less than 140/90. If you remain above this, call your heart doctor to schedule an appointment to adjust your blood pressure medications. If you remain above 180/90 or you develop new headaches, nausea, vomiting, dizziness, you need to go to the emergency department.  Follow up in 6 weeks to see how new inhaler is going with Dr. Lamonte Sakai or Alanson Aly. If symptoms do not improve or worsen, please contact office for sooner follow up or seek emergency care.    Chronic respiratory failure with hypoxia (HCC) Stable without increased O2 requirement. Goal >88-90%.  Hypertension Hypertensive in office today. Euvolemic on exam. No red flag symptoms. ED precautions reviewed. He declined further workup there today. I think he needs to be changed back to his losartan vs increasing his metoprolol. Would be hesitant with how severe his COPD is to increase beta blockers significantly. He will contact his cardiologist, Dr. Harl Bowie, today to receive further recommendations.   CHF (congestive heart failure)  (HCC) Euvolemic on exam. Compliant with diuretic regimen.     I spent 38 minutes of dedicated to the care of this patient on the date of this encounter to include pre-visit review of records, face-to-face time with the patient discussing conditions above, post visit ordering of testing, clinical documentation with the electronic health record, making appropriate referrals as documented, and communicating necessary findings to members of the patients care team.  Clayton Bibles, NP 12/22/2022  Pt aware and understands NP's role.

## 2022-12-21 NOTE — Telephone Encounter (Signed)
Can we please run a prior auth. for pt to see what inhalers Medicaid will cover.   No longer covering Breztri per pt.

## 2022-12-22 ENCOUNTER — Encounter: Payer: Self-pay | Admitting: Nurse Practitioner

## 2022-12-22 MED ORDER — TRELEGY ELLIPTA 200-62.5-25 MCG/ACT IN AEPB: 1.0000 | INHALATION_SPRAY | Freq: Every day | RESPIRATORY_TRACT | 3 refills | Status: DC

## 2022-12-22 MED ORDER — TRELEGY ELLIPTA 200-62.5-25 MCG/ACT IN AEPB: 1.0000 | INHALATION_SPRAY | Freq: Every day | RESPIRATORY_TRACT | 5 refills | Status: DC

## 2022-12-22 NOTE — Assessment & Plan Note (Signed)
Very severe COPD with high symptom burden. Appears to be experiencing a mild exacerbation due to lack of maintenance therapy. We will increase his steroids for the next week and restart him on triple therapy. I suspect we will likely have to change him to Trelegy but we will send prior auth team a message to run test claim. Provided with Breztri samples in the interim. Action plan in place.   Patient Instructions  Continue Albuterol inhaler 2 puffs or 3 mL neb every 6 hours as needed for shortness of breath or wheezing. Notify if symptoms persist despite rescue inhaler/neb use. Continue Breztri 2 puffs Twice daily with the samples provided today. Brush tongue and rinse mouth afterwards.  Once we hear back from the pharmacy, I will send in a new prescription for an inhaler that your insurance will pay for Continue supplemental oxygen 3-4 lpm for goal >88-90% Continue pantoprazole 1 tab daily  Increase prednisone to 40 mg for the next 3 days then 30 mg for 3 days then back to your 15 mg daily. Take in AM with food  Monitor your BP at home. Goal is less than 140/90. If you remain above this, call your heart doctor to schedule an appointment to adjust your blood pressure medications. If you remain above 180/90 or you develop new headaches, nausea, vomiting, dizziness, you need to go to the emergency department.  Follow up in 6 weeks to see how new inhaler is going with Dr. Lamonte Sakai or Alanson Aly. If symptoms do not improve or worsen, please contact office for sooner follow up or seek emergency care.

## 2022-12-22 NOTE — Assessment & Plan Note (Signed)
Stable without increased O2 requirement. Goal >88-90% 

## 2022-12-22 NOTE — Assessment & Plan Note (Signed)
Euvolemic on exam. Compliant with diuretic regimen.

## 2022-12-22 NOTE — Telephone Encounter (Signed)
Pt just had an OV with Katie 12/21/22 and note from [place] about pt's prednisone is posted below:  Increase prednisone to 40 mg for the next 3 days then 30 mg for 3 days then back to your 15 mg daily. Take in AM with food   Attempted to call pt's sister Langley Gauss but line went directly to VM. Unable to leave VM as mailbox was full. Will try to call back later.  We need to know which pharmacy to send prednisone to for pt if we need to send in a new refill.

## 2022-12-22 NOTE — Assessment & Plan Note (Signed)
Hypertensive in office today. Euvolemic on exam. No red flag symptoms. ED precautions reviewed. He declined further workup there today. I think he needs to be changed back to his losartan vs increasing his metoprolol. Would be hesitant with how severe his COPD is to increase beta blockers significantly. He will contact his cardiologist, Dr. Harl Bowie, today to receive further recommendations.

## 2022-12-25 MED ORDER — PREDNISONE 5 MG PO TABS: 5.0000 mg | ORAL_TABLET | Freq: Every day | ORAL | 0 refills | Status: DC

## 2022-12-25 MED ORDER — PREDNISONE 10 MG PO TABS: 10.0000 mg | ORAL_TABLET | Freq: Every day | ORAL | 0 refills | Status: DC

## 2022-12-25 NOTE — Telephone Encounter (Signed)
Pt states he needed a refill on Prednisone 15mg . Prescription has been sent. Nothing further needed. Pharmacy [facility]

## 2023-01-01 ENCOUNTER — Telehealth: Payer: Self-pay | Admitting: Internal Medicine

## 2023-01-01 NOTE — Telephone Encounter (Signed)
Left message for patient to call back  Unable to locate Sean Hull on [place] is on the list

## 2023-01-01 NOTE — Telephone Encounter (Signed)
Pt c/o BP issue: STAT if pt c/o blurred vision, one-sided weakness or slurred speech  1. What are your last 5 BP readings? Saturday 200/113, 213/107, does not have any more readings  2. Are you having any other symptoms (ex. Dizziness, headache, blurred vision, passed out)? Headache off and on, says he thinks it is not related  3. What is your BP issue? Patient's sister says the patient's BP has been very high. She says he was told by his PCP to go to the ED if it got up to 200 again. She says one day his head hurt so bad it made his eyes hurt and it last 2-3 days.

## 2023-01-02 NOTE — Telephone Encounter (Signed)
Verda Cumins, sister and per DPR on 05/17/22 per K. Causey. LMTCB regarding BP.

## 2023-01-04 ENCOUNTER — Telehealth: Payer: Self-pay | Admitting: Internal Medicine

## 2023-01-04 NOTE — Telephone Encounter (Signed)
Pt c/o medication issue:  1. Name of Medication: metoprolol succinate (TOPROL-XL) 25 MG 24 hr tablet   atorvastatin (LIPITOR) 20 MG tablet    2. How are you currently taking this medication (dosage and times per day)? As prescribed   3. Are you having a reaction (difficulty breathing--STAT)?   4. What is your medication issue?  Pt's sister is requesting return call to get clarification as to how this medication is to be taken. Please advise.

## 2023-01-04 NOTE — Telephone Encounter (Signed)
Called provided number, no answer. The voicemail is full, unable to leave a message.   There is another message involving this pt, see below:  Verda Cumins, sister and per DPR on 05/17/22 per K. Causey. LMTCB regarding BP.      Note   3 days ago    Left message for patient to call back  Unable to locate Verda Cumins on [place] is on the list      Note   Zobro, Geraldo Pitter routed conversation to [employer] Triage3 days ago   Zobro, [place] days ago   SZ Pt c/o BP issue: STAT if pt c/o blurred vision, one-sided weakness or slurred speech   1. What are your last 5 BP readings? Saturday 200/113, 213/107, does not have any more readings   2. Are you having any other symptoms (ex. Dizziness, headache, blurred vision, passed out)? Headache off and on, says he thinks it is not related   3. What is your BP issue? Patient's sister says the patient's BP has been very high. She says he was told by his PCP to go to the ED if it got up to 200 again. She says one day his head hurt so bad it made his eyes hurt and it last 2-3 days.       Note

## 2023-01-04 NOTE — Telephone Encounter (Signed)
Message combined with other message. See chart.

## 2023-01-11 ENCOUNTER — Ambulatory Visit: Payer: Medicaid Other | Admitting: Nurse Practitioner

## 2023-01-15 NOTE — Telephone Encounter (Signed)
Unable to leave message on any phone number.

## 2023-01-17 ENCOUNTER — Ambulatory Visit: Payer: Medicaid Other | Admitting: Internal Medicine

## 2023-01-17 NOTE — Telephone Encounter (Signed)
Called to f/u about pt's BP. Verda Cumins, sister- EC said that his blood pressure has gone down- she bought him a new BP cuff and also- he started taking BP meds that the heart doctor had said to take (he was taking what his PCP told him to take). She said that he is better and has an appt on 02/02/23.  Call was cut off, but no further info needed.

## 2023-01-18 ENCOUNTER — Ambulatory Visit: Payer: Medicaid Other | Admitting: Internal Medicine

## 2023-02-01 ENCOUNTER — Ambulatory Visit: Payer: Medicaid Other | Admitting: Internal Medicine

## 2023-02-02 ENCOUNTER — Ambulatory Visit: Payer: Medicaid Other | Attending: Internal Medicine | Admitting: Internal Medicine

## 2023-02-02 ENCOUNTER — Ambulatory Visit (INDEPENDENT_AMBULATORY_CARE_PROVIDER_SITE_OTHER): Payer: Medicaid Other | Admitting: Nurse Practitioner

## 2023-02-02 ENCOUNTER — Encounter: Payer: Self-pay | Admitting: Nurse Practitioner

## 2023-02-02 ENCOUNTER — Encounter: Payer: Self-pay | Admitting: Internal Medicine

## 2023-02-02 VITALS — BP 160/92 | HR 81 | Ht 71.0 in | Wt 146.2 lb

## 2023-02-02 VITALS — BP 201/99 | HR 82 | Ht 71.0 in | Wt 145.0 lb

## 2023-02-02 DIAGNOSIS — I10 Essential (primary) hypertension: Secondary | ICD-10-CM

## 2023-02-02 DIAGNOSIS — J449 Chronic obstructive pulmonary disease, unspecified: Secondary | ICD-10-CM

## 2023-02-02 DIAGNOSIS — J9611 Chronic respiratory failure with hypoxia: Secondary | ICD-10-CM | POA: Diagnosis not present

## 2023-02-02 MED ORDER — AMLODIPINE BESYLATE 10 MG PO TABS: 10.0000 mg | ORAL_TABLET | Freq: Every day | ORAL | 3 refills | Status: DC

## 2023-02-02 MED ORDER — CARVEDILOL 25 MG PO TABS: 25.0000 mg | ORAL_TABLET | Freq: Two times a day (BID) | ORAL | 3 refills | Status: DC

## 2023-02-02 MED ORDER — TRELEGY ELLIPTA 100-62.5-25 MCG/ACT IN AEPB: 1.0000 | INHALATION_SPRAY | Freq: Every day | RESPIRATORY_TRACT | 5 refills | Status: DC

## 2023-02-02 MED ORDER — PREDNISONE 5 MG PO TABS: 15.0000 mg | ORAL_TABLET | Freq: Every day | ORAL | 5 refills | Status: DC

## 2023-02-02 NOTE — Patient Instructions (Addendum)
Continue Albuterol inhaler 2 puffs or 3 mL neb every 6 hours as needed for shortness of breath or wheezing. Notify if symptoms persist despite rescue inhaler/neb use. Start Trelegy 1 puff daily. Brush tongue and rinse mouth afterwards Continue supplemental oxygen 3-4 lpm for goal >88-90% Continue pantoprazole 1 tab daily Continue prednisone 15 mg daily in AM   Monitor your BP at home. Goal is less than 140/90. If you remain above this, call your heart doctor to schedule an appointment to adjust your blood pressure medications. If you remain above 180/90 or you develop new headaches, nausea, vomiting, dizziness, you need to go to the emergency department.   Follow up in 6 weeks to see how new inhaler is going with Dr. Lamonte Sakai or Alanson Aly. If symptoms do not improve or worsen, please contact office for sooner follow up or seek emergency care.

## 2023-02-02 NOTE — Assessment & Plan Note (Signed)
Stable without increased O2 requirement. Goal >88-90% 

## 2023-02-02 NOTE — Patient Instructions (Addendum)
Medication Instructions:   STOP METOPROLOL   START: CARVEDILOL '25mg'$  TWICE DAILY   START: AMLODIPINE  *If you need a refill on your cardiac medications before your next appointment, please call your pharmacy*  Lab Work: None Ordered At This Time.  If you have labs (blood work) drawn today and your tests are completely normal, you will receive your results only by: [place] (if you have MyChart) OR A paper copy in the mail If you have any lab test that is abnormal or we need to change your treatment, we will call you to review the results.  Testing/Procedures: None Ordered At This Time.   Follow-Up: At [facility], you and your health needs are our priority.  As part of our continuing mission to provide you with exceptional heart care, we have created designated Provider Care Teams.  These Care Teams include your primary Cardiologist (physician) and Advanced Practice Providers (APPs -  Physician Assistants and Nurse Practitioners) who all work together to provide you with the care you need, when you need it.  PLEASE SCHEDULE PHARMD APPOINTMENT FOR BLOOD PRESSURE   Your next appointment:   6 month(s)  Provider:   Janina Mayo, MD

## 2023-02-02 NOTE — Assessment & Plan Note (Signed)
Monitor BP at home for goal <140/90. Strict ED precautions with red flag symptoms. Follow up with cardiology as scheduled.

## 2023-02-02 NOTE — Progress Notes (Signed)
Cardiology Office Note:    Date:  02/02/2023   ID:  Sean Hull, DOB 06/15/1961, MRN [id number]  PCP:  Merryl Hacker, No   [facility] Providers Cardiologist:  Janina Mayo, MD     Referring MD: No ref. provider found   No chief complaint on file. Shortness of breath  History of Present Illness:    Sean Hull is a 62 y.o. male with a hx HFpeF, of severe COPD, HTN, and substance abuse, recent hospitalization for decompensated CHF/ COPD exacerbation referral for hospital follow up  See DC summary for details 12/29/2021. Briefly p/w  LE edema and orthopnea. He's had a weight gain of 31 pounds. BNP 1868. He has imaging findings below.  Chest radiograph with hyperinflation with positive bibasilar atelectasis more left than right, positive hilar vascular congestion.    CT chest with no pulmonary embolism, diffuse emphysema, with 8-7 mm right lower lobe pulmonary nodule.    He was managed with IV lasix had total net negative 5L.  Last weight 2/22 140 pounds; 136 on discharge.  Wt Readings from Last 3 Encounters:  02/02/23 146 lb 3.2 oz (66.3 kg)  02/02/23 145 lb (65.8 kg)  12/21/22 144 lb 3.2 oz (65.4 kg)   He feels low energy. He feels no strength for about 6 months to 1 year. He has not had a dramatic improvement with this. He has noticed that from a fluid standpoint he has improved. He can lie flat.  He's 151 pounds today which is . He took lasix Sunday or Monday, then he stopped taking it. He did not think he was getting a good response.   Interim Hx 02/02/2023 No hospitalization. No illness. Notes some chest tightness, not daily.  No changes with breathing. No orthopnea or PND. Wt stable 145.    Cardiology Studies  TTE-LVEF 50% RV was moderately enlarged. CT did not show PE. He did not have evidence of pulmonary HTN on echo.   EKG with 89 bpm, RVH  Past Medical History:  Diagnosis Date   Acute respiratory failure with hypoxia (HCC)    Anxiety    Arthritis    hands   CAP  (community acquired pneumonia)    Cataract    bilateral-removed   COPD (chronic obstructive pulmonary disease) (HCC)    Dyspnea    with exertion   Emphysema of lung (HCC)    GERD (gastroesophageal reflux disease)    Hypertension    Oxygen deficiency    Pneumonia    Substance use disorder    pt.denies as of 01/25/22   Tobacco use     Past Surgical History:  Procedure Laterality Date   BACK SURGERY     Disectomy    cataracts Bilateral    CYST EXCISION     left side of face   ESOPHAGEAL DILATION     ROTATOR CUFF REPAIR Right     x2   UPPER GASTROINTESTINAL ENDOSCOPY     VIDEO BRONCHOSCOPY WITH ENDOBRONCHIAL ULTRASOUND N/A 02/12/2019   Procedure: VIDEO BRONCHOSCOPY WITH ENDOBRONCHIAL ULTRASOUND;  Surgeon: Collene Gobble, MD;  Location: [facility] OR;  Service: Thoracic;  Laterality: N/A;    Current Medications: Current Outpatient Medications on File Prior to Visit  Medication Sig Dispense Refill   acetaminophen (TYLENOL) 325 MG tablet Take 650 mg by mouth every 6 (six) hours as needed.     albuterol (PROVENTIL) (2.5 MG/3ML) 0.083% nebulizer solution Take 3 mLs (2.5 mg total) by nebulization every 6 (six) hours as  needed for wheezing or shortness of breath. 75 mL 12   albuterol (VENTOLIN HFA) 108 (90 Base) MCG/ACT inhaler Inhale 2 puffs into the lungs every 6 (six) hours as needed for wheezing or shortness of breath. 18 g 3   atorvastatin (LIPITOR) 20 MG tablet Take 1 tablet (20 mg total) by mouth daily. 90 tablet 3   OXYGEN Inhale 3-4 L into the lungs.     pantoprazole (PROTONIX) 40 MG tablet Take 1 tablet (40 mg total) by mouth daily. Take 30 minutes before breakfast 90 tablet 5   torsemide (DEMADEX) 20 MG tablet TAKE 1 TABLET(20 MG) BY MOUTH DAILY 90 tablet 3   Fluticasone-Umeclidin-Vilant (TRELEGY ELLIPTA) 100-62.5-25 MCG/ACT AEPB Inhale 1 puff into the lungs daily. 60 each 5   predniSONE (DELTASONE) 5 MG tablet Take 3 tablets (15 mg total) by mouth daily with breakfast. 90 tablet 5    No current facility-administered medications on file prior to visit.     Allergies:   Patient has no known allergies.   Social History   Socioeconomic History   Marital status: Widowed    Spouse name: Not on file   Number of children: Not on file   Years of education: Not on file   Highest education level: Not on file  Occupational History   Not on file  Tobacco Use   Smoking status: Some Days    Packs/day: 1.50    Years: 40.00    Total pack years: 60.00    Types: Cigarettes    Passive exposure: Current   Smokeless tobacco: Never   Tobacco comments:    0.5-1ppd HEI 02/02/2023  Vaping Use   Vaping Use: Never used  Substance and Sexual Activity   Alcohol use: Yes    Alcohol/week: 6.0 standard drinks of alcohol    Types: 6 Cans of beer per week    Comment: per week   Drug use: Not Currently    Types: "Crack" cocaine    Comment: crack - last time  1 month or so ago- it was days before hospitalization in [place], 01/25/2019.   Sexual activity: Yes  Other Topics Concern   Not on file  Social History Narrative   Not on file   Social Determinants of Health   Financial Resource Strain: Not on file  Food Insecurity: Not on file  Transportation Needs: Not on file  Physical Activity: Not on file  Stress: Not on file  Social Connections: Not on file     Family History: The patient's family history includes Diabetes in his mother; Heart disease in his mother; Hypertension in his father and mother. There is no history of Colon cancer, Esophageal cancer, Colon polyps, Rectal cancer, or Stomach cancer.  ROS:   Please see the history of present illness.     All other systems reviewed and are negative.  EKGs/Labs/Other Studies Reviewed:    The following studies were reviewed today:   EKG:  EKG is  ordered today.  The ekg ordered today demonstrates   NSR, RVH  02/02/2023- NSR, RVH; wavering baseline  Recent Labs: 02/20/2022: ALT 9 04/27/2022: Pro B Natriuretic  peptide (BNP) 1,338.0 05/17/2022: Hemoglobin 13.5; Platelets 261 07/20/2022: BUN 17; Creatinine, Ser 1.02; Potassium 5.0; Sodium 143  Recent Lipid Panel    Component Value Date/Time   CHOL 170 01/06/2022 1446   TRIG 50 01/06/2022 1446   HDL 78 01/06/2022 1446   CHOLHDL 2.2 01/06/2022 1446   LDLCALC 82 01/06/2022 1446     Risk  Assessment/Calculations:     The 10-year ASCVD risk score (Arnett DK, et al., 2019) is: 15.7%   Values used to calculate the score:     Age: 70 years     Sex: Male     Is Non-Hispanic African American: No     Diabetic: No     Tobacco smoker: Yes     Systolic Blood Pressure: 0000000 mmHg     Is BP treated: Yes     HDL Cholesterol: 78 mg/dL     Total Cholesterol: 170 mg/dL       Physical Exam:    VS:   Vitals:   02/02/23 1329  BP: (!) 201/99  Pulse: 82  SpO2: 99%    Wt Readings from Last 3 Encounters:  02/02/23 146 lb 3.2 oz (66.3 kg)  02/02/23 145 lb (65.8 kg)  12/21/22 144 lb 3.2 oz (65.4 kg)     GEN: No acute distress, frail, on O2 HEENT: Normal NECK: No sig JVD LYMPHATICS: No lymphadenopathy CARDIAC: RRR, no murmurs, rubs, gallops RESPIRATORY:  mild wob, decreased air movement, no wheezing ABDOMEN: Soft, non-tender, non-distended MUSCULOSKELETAL:  no pitting edema ; No deformity  SKIN: Warm and dry NEUROLOGIC:  Alert and oriented x 3 PSYCHIATRIC:  Normal affect   ASSESSMENT:    HFpeF: LV EF 50%, moderately enlarged RV. RV function was normal. No pulmonary htn. No large ASD. RV enlargement likely related to significant emphysema. He has gained weight and has pitting edema. Increased his lasix in March of last year for hypervolemia. 40 BID for 5 days then 40 mg daily afterwards. He was up about 15 pounds at that time. Today is asymptomatic from a cardiac standpoint - euvolemic - continue torsemide 20 mg daily - dry weight 136-140 - hold off on spiro with hyperkalemia  HLD- continue lipitor 20 mg daily  HTN-not well controlled.   Replace metop with coreg. Start norvasc  Smoking: wellbutrin/patch did not help.  PLAN:    In order of problems listed above:  Change metop 25 XL to coreg 25 mg BID Start norvasc 10 mg daily Gave BP log Pharmacy HTN Clinic in 3 months Follow up in 6 months      Medication Adjustments/Labs and Tests Ordered: Current medicines are reviewed at length with the patient today.  Concerns regarding medicines are outlined above.  Orders Placed This Encounter  Procedures   AMB Referral to [facility]   EKG 12-Lead   Meds ordered this encounter  Medications   carvedilol (COREG) 25 MG tablet    Sig: Take 1 tablet (25 mg total) by mouth 2 (two) times daily.    Dispense:  180 tablet    Refill:  3   amLODipine (NORVASC) 10 MG tablet    Sig: Take 1 tablet (10 mg total) by mouth daily.    Dispense:  90 tablet    Refill:  3    Patient Instructions  Medication Instructions:   STOP METOPROLOL   START: CARVEDILOL '25mg'$  TWICE DAILY   START: AMLODIPINE  *If you need a refill on your cardiac medications before your next appointment, please call your pharmacy*  Lab Work: None Ordered At This Time.  If you have labs (blood work) drawn today and your tests are completely normal, you will receive your results only by: Kite (if you have MyChart) OR A paper copy in the mail If you have any lab test that is abnormal or we need to change your treatment, we will call you  to review the results.  Testing/Procedures: None Ordered At This Time.   Follow-Up: At [facility], you and your health needs are our priority.  As part of our continuing mission to provide you with exceptional heart care, we have created designated Provider Care Teams.  These Care Teams include your primary Cardiologist (physician) and Advanced Practice Providers (APPs -  Physician Assistants and Nurse Practitioners) who all work together to provide you with the care you need, when you need  it.  PLEASE SCHEDULE PHARMD APPOINTMENT FOR BLOOD PRESSURE   Your next appointment:   6 month(s)  Provider:   Janina Mayo, MD      Signed, Janina Mayo, MD  02/02/2023 4:26 PM    [place]

## 2023-02-02 NOTE — Progress Notes (Signed)
$'@Patient'T$  ID: Sean Hull, male    DOB: 1961-05-18, 62 y.o.   MRN: [id number]  Chief Complaint  Patient presents with   Follow-up    Pt f/u he state that he is feeling the same as LOV but sister believes it is worse this week    Referring provider: No ref. provider found  HPI: 62 year old male, current everyday smoker (60 pack years, 1.5 pack/day) followed for COPD with emphysema.  He is a patient of Dr. Agustina Caroli and last seen in office 12/21/2022 by [facility] NP.  Past medical history significant for mediastinal lymphadenopathy, coronary artery calcification, hypertension, CHF, history of Pseudomonas infection.  TEST/EVENTS:  12/26/2021 CTA chest: No evidence of PE.  There is mild diffuse thickening of the esophagus.  There is a left paratracheal node 1.1 cm.  Right hilar node 1.1 cm.  Both suspected to be reactive.  Clustered AP window lymph nodes.  Similar to prior.  There is biapical pleural-parenchymal scarring.  Prominent emphysema is present.  There is improved aeration in the right middle lobe, although a small amount of airspace opacity persists.  There is an 8 x 7 mm right lower lobe pulmonary nodule, slightly decreased in size from previous scan in 2020.  Some improvement of the adjacent branching nodularity in the left lower lobe previously seen.  There is upper abdominal ascites and mesenteric edema.  Substantial subcutaneous edema along the upper abdomen. 12/27/2021 echocardiogram: EF 50%.  G1 DD.  Unable to measure PASP.  Trivial MR. 04/19/2022 CTA chest: no PE. RML consolidation. Interstitial thickening. 10 mm nodule in RLL, unchanged from 2021 imaging. Reactive LAD.  08/03/2022 Super D CT chest: atherosclerosis. Enlarged pulmonic trunk. Severe emphysema. Scattered parenchymal scarring. 10 mm RLL nodule stable. No new nodules. Previous consolidation resolved. Right adrenal adenoma.   09/20/2022: OV with Dr. Lamonte Sakai. Some benefit from prednisone. Functional capacity slightly better. Will  try decreasing to 15 mg daily and stay at this dose. Smoking cessation again advised. Pulmonary nodule stable for over 2 years, deemed benign. We will consider referring him to the lung cancer screening program going forward. Given the severity of his disease he may not be a good candidate for any diagnostics if a nodule was found.   12/21/2022: OV with Hadyn Blanck NP for follow up with his sister. He has been doing ok since he was here last. Hasn't required any steroids or abx. No hospitalizations. He did run out of his Breztri 3 weeks ago and insurance has told him that it's no longer covered. Since then, he's felt more short winded especially over the past few days. Has noticed more wheezing too. Using his rescue multiple times a day. He has not had any change in his chronic cough. No increased chest congestion, fevers, chills, hemoptysis. He is on prednisone 15 mg daily.  He had elevated BP in office today. Upon recheck was still 180/90. Upon review of his chart, his PCP discontinued his losartan and started him on metoprolol in September. There was note of tachycardia but HR was documented at 84 and he doesn't recall this. Since then, his pressures have been running on the higher side. He hasn't been back to see cardiology. He denies headaches, N/V, dizziness, syncope, chest pain, palpitations. Admits compliance to his metoprolol and fluid pills. No leg swelling, weight gain, or orthopnea today.   02/02/2023: Today - follow up Patient presents today for follow up with his sister. Since our last visit, he continued to struggle with his blood pressure.  He saw cardiology who adjusted his antihypertensives earlier today. He's also been off of his maintenance inhaler. He was supposed to change to Trelegy due to insurance but never did. Feels like over the past week, he's been more short winded. Denies increased cough, chest congestion, or wheezing. No leg swelling. He has been using his rescue inhaler and nebulizer.    No Known Allergies  Immunization History  Administered Date(s) Administered   Influenza,inj,Quad PF,6+ Mos 11/10/2019, 12/29/2021, 09/20/2022   Influenza-Unspecified 11/10/2019   Pneumococcal Polysaccharide-23 11/10/2019    Past Medical History:  Diagnosis Date   Acute respiratory failure with hypoxia ([place])    Anxiety    Arthritis    hands   CAP (community acquired pneumonia)    Cataract    bilateral-removed   COPD (chronic obstructive pulmonary disease) ([place])    Dyspnea    with exertion   Emphysema of lung (HCC)    GERD (gastroesophageal reflux disease)    Hypertension    Oxygen deficiency    Pneumonia    Substance use disorder    pt.denies as of 01/25/22   Tobacco use     Tobacco History: Social History   Tobacco Use  Smoking Status Some Days   Packs/day: 1.50   Years: 40.00   Total pack years: 60.00   Types: Cigarettes   Passive exposure: Current  Smokeless Tobacco Never  Tobacco Comments   0.5-1ppd HEI 02/02/2023   Ready to quit: Not Answered Counseling given: Not Answered Tobacco comments: 0.5-1ppd HEI 02/02/2023   Outpatient Medications Prior to Visit  Medication Sig Dispense Refill   acetaminophen (TYLENOL) 325 MG tablet Take 650 mg by mouth every 6 (six) hours as needed.     albuterol (PROVENTIL) (2.5 MG/3ML) 0.083% nebulizer solution Take 3 mLs (2.5 mg total) by nebulization every 6 (six) hours as needed for wheezing or shortness of breath. 75 mL 12   albuterol (VENTOLIN HFA) 108 (90 Base) MCG/ACT inhaler Inhale 2 puffs into the lungs every 6 (six) hours as needed for wheezing or shortness of breath. 18 g 3   amLODipine (NORVASC) 10 MG tablet Take 1 tablet (10 mg total) by mouth daily. 90 tablet 3   atorvastatin (LIPITOR) 20 MG tablet Take 1 tablet (20 mg total) by mouth daily. 90 tablet 3   carvedilol (COREG) 25 MG tablet Take 1 tablet (25 mg total) by mouth 2 (two) times daily. 180 tablet 3   OXYGEN Inhale 3-4 L into the lungs.     pantoprazole  (PROTONIX) 40 MG tablet Take 1 tablet (40 mg total) by mouth daily. Take 30 minutes before breakfast 90 tablet 5   torsemide (DEMADEX) 20 MG tablet TAKE 1 TABLET(20 MG) BY MOUTH DAILY 90 tablet 3   predniSONE (DELTASONE) 10 MG tablet Take 1 tablet (10 mg total) by mouth daily with breakfast. 90 tablet 0   predniSONE (DELTASONE) 5 MG tablet Take 1 tablet (5 mg total) by mouth daily with breakfast. 90 tablet 0   Budeson-Glycopyrrol-Formoterol (BREZTRI AEROSPHERE) 160-9-4.8 MCG/ACT AERO Inhale into the lungs. (Patient not taking: Reported on 02/02/2023)     No facility-administered medications prior to visit.     Review of Systems:   Constitutional: No weight loss or gain, night sweats, fevers, chills +fatigue (baseline) HEENT: No headaches, difficulty swallowing, tooth/dental problems, or sore throat. No sneezing, itching, ear ache, nasal congestion, or post nasal drip CV:  No chest pain, swelling lower extremities, orthopnea, PND, anasarca, dizziness, palpitations, syncope Resp: +shortness of breath with  exertion; productive AM cough (baseline). No wheeze.   No hemoptysis.  No chest wall deformity GI:  No heartburn, indigestion, abdominal pain, nausea, vomiting, diarrhea, change in bowel habits, loss of appetite, bloody stools.  GU: No dysuria, change in color of urine, urgency or frequency. Skin: No rash, lesions, ulcerations MSK:  No joint pain or swelling.   Neuro: No dizziness or lightheadedness.  Psych: No depression or anxiety. Mood stable.     Physical Exam:  BP (!) 160/92   Pulse 81   Ht '5\' 11"'$  (1.803 m)   Wt 146 lb 3.2 oz (66.3 kg)   SpO2 95%   BMI 20.39 kg/m   GEN: Pleasant, interactive, chronically-ill appearing; in no acute distress. HEENT:  Normocephalic and atraumatic. PERRLA. Sclera white. Nasal turbinates pink, moist and patent bilaterally. No rhinorrhea present. Oropharynx pink and moist, without exudate or edema. No lesions, ulcerations, or postnasal drip.  NECK:   Supple w/ fair ROM. No JVD present. Normal carotid impulses w/o bruits. Thyroid symmetrical with no goiter or nodules palpated. No lymphadenopathy.   CV: RRR, no m/r/g, peripheral edema. Pulses intact, +2 bilaterally. No cyanosis, pallor or clubbing. PULMONARY: Unlabored, regular. Diminished bilaterally A&P. No accessory muscle use. No dullness to percussion. GI: BS present and normoactive. Soft, non-tender to palpation.  MSK: No erythema, warmth or tenderness. Cap refil <2 sec all extrem. No deformities or joint swelling noted.  Neuro: A/Ox3. No focal deficits noted.   Skin: Warm, no lesions or rashe Psych: Normal affect and behavior. Judgement and thought content appropriate.     Lab Results:  CBC    Component Value Date/Time   WBC 8.3 05/17/2022 1547   WBC 10.5 04/17/2022 1517   RBC 4.56 05/17/2022 1547   RBC 4.44 04/17/2022 1517   HGB 13.5 05/17/2022 1547   HCT 42.0 05/17/2022 1547   PLT 261 05/17/2022 1547   MCV 92 05/17/2022 1547   MCH 29.6 05/17/2022 1547   MCH 30.7 12/27/2021 0330   MCHC 32.1 05/17/2022 1547   MCHC 31.2 04/17/2022 1517   RDW 15.8 (H) 05/17/2022 1547   LYMPHSABS 0.6 (L) 04/17/2022 1517   LYMPHSABS 1.1 01/06/2022 1446   MONOABS 0.6 04/17/2022 1517   EOSABS 0.0 04/17/2022 1517   EOSABS 0.0 01/06/2022 1446   BASOSABS 0.0 04/17/2022 1517   BASOSABS 0.1 01/06/2022 1446    BMET    Component Value Date/Time   NA 143 07/20/2022 1615   K 5.0 07/20/2022 1615   CL 97 07/20/2022 1615   CO2 31 (H) 07/20/2022 1615   GLUCOSE 99 07/20/2022 1615   GLUCOSE 68 (L) 04/27/2022 1655   BUN 17 07/20/2022 1615   CREATININE 1.02 07/20/2022 1615   CALCIUM 9.2 07/20/2022 1615   GFRNONAA >60 12/29/2021 0343   GFRAA >60 02/10/2019 1138    BNP    Component Value Date/Time   BNP 1,868.7 (H) 12/26/2021 1056     Imaging:  No results found.         No data to display          No results found for: "NITRICOXIDE"      Assessment & Plan:   COPD,  very severe ([place]) Very severe COPD with mild flare due to lack of maintenance inhaler. He is maintained on daily prednisone 15 mg. He does not have any bronchospasm on exam today and VS stable. Will hold off on increasing his steroids. Provided with sample of Trelegy and new rx sent. Teachback performed. Advised him to  utilize albuterol nebs until symptoms improve. If he continues to have poor control, we will transition him to triple therapy nebs. Action plan in place. Strict return and close follow up.  Patient Instructions  Continue Albuterol inhaler 2 puffs or 3 mL neb every 6 hours as needed for shortness of breath or wheezing. Notify if symptoms persist despite rescue inhaler/neb use. Start Trelegy 1 puff daily. Brush tongue and rinse mouth afterwards Continue supplemental oxygen 3-4 lpm for goal >88-90% Continue pantoprazole 1 tab daily Continue prednisone 15 mg daily in AM   Monitor your BP at home. Goal is less than 140/90. If you remain above this, call your heart doctor to schedule an appointment to adjust your blood pressure medications. If you remain above 180/90 or you develop new headaches, nausea, vomiting, dizziness, you need to go to the emergency department.   Follow up in 6 weeks to see how new inhaler is going with Dr. Lamonte Sakai or Alanson Aly. If symptoms do not improve or worsen, please contact office for sooner follow up or seek emergency care.    Chronic respiratory failure with hypoxia (HCC) Stable without increased O2 requirement. Goal >88-90%  Hypertension Monitor BP at home for goal <140/90. Strict ED precautions with red flag symptoms. Follow up with cardiology as scheduled.     I spent 35 minutes of dedicated to the care of this patient on the date of this encounter to include pre-visit review of records, face-to-face time with the patient discussing conditions above, post visit ordering of testing, clinical documentation with the electronic health record, making  appropriate referrals as documented, and communicating necessary findings to members of the patients care team.  Clayton Bibles, NP 02/02/2023  Pt aware and understands NP's role.

## 2023-02-02 NOTE — Assessment & Plan Note (Signed)
Very severe COPD with mild flare due to lack of maintenance inhaler. He is maintained on daily prednisone 15 mg. He does not have any bronchospasm on exam today and VS stable. Will hold off on increasing his steroids. Provided with sample of Trelegy and new rx sent. Teachback performed. Advised him to utilize albuterol nebs until symptoms improve. If he continues to have poor control, we will transition him to triple therapy nebs. Action plan in place. Strict return and close follow up.  Patient Instructions  Continue Albuterol inhaler 2 puffs or 3 mL neb every 6 hours as needed for shortness of breath or wheezing. Notify if symptoms persist despite rescue inhaler/neb use. Start Trelegy 1 puff daily. Brush tongue and rinse mouth afterwards Continue supplemental oxygen 3-4 lpm for goal >88-90% Continue pantoprazole 1 tab daily Continue prednisone 15 mg daily in AM   Monitor your BP at home. Goal is less than 140/90. If you remain above this, call your heart doctor to schedule an appointment to adjust your blood pressure medications. If you remain above 180/90 or you develop new headaches, nausea, vomiting, dizziness, you need to go to the emergency department.   Follow up in 6 weeks to see how new inhaler is going with Dr. Lamonte Sakai or Alanson Aly. If symptoms do not improve or worsen, please contact office for sooner follow up or seek emergency care.

## 2023-02-06 NOTE — Progress Notes (Signed)
Agree with plans

## 2023-02-22 ENCOUNTER — Telehealth: Payer: Self-pay | Admitting: Nurse Practitioner

## 2023-02-22 ENCOUNTER — Other Ambulatory Visit: Payer: Self-pay

## 2023-02-22 DIAGNOSIS — J441 Chronic obstructive pulmonary disease with (acute) exacerbation: Secondary | ICD-10-CM

## 2023-02-22 MED ORDER — ALBUTEROL SULFATE (2.5 MG/3ML) 0.083% IN NEBU: 2.5000 mg | INHALATION_SOLUTION | Freq: Four times a day (QID) | RESPIRATORY_TRACT | 12 refills | Status: DC | PRN

## 2023-02-22 NOTE — Telephone Encounter (Signed)
Refill for albuterol nebulizer sent to pharmacy. Nothing further needed.

## 2023-03-09 ENCOUNTER — Other Ambulatory Visit (HOSPITAL_COMMUNITY): Payer: Self-pay

## 2023-03-09 ENCOUNTER — Telehealth: Payer: Self-pay | Admitting: Nurse Practitioner

## 2023-03-09 NOTE — Telephone Encounter (Signed)
Patient is showing as having two insurance plans through [facility] and through [place], which plan is saying a PA is required?

## 2023-03-09 NOTE — Telephone Encounter (Signed)
Called pt to check to see which insurance was saying that the Trelegy was requiring a PA and he said it was his Medicaid.

## 2023-03-09 NOTE — Telephone Encounter (Signed)
Received a call about pt's Trelegy stating that it was requiring insurance approval prior to pt being able to get next fill.  Routing to prior auth team for assistance.

## 2023-03-12 ENCOUNTER — Other Ambulatory Visit (HOSPITAL_COMMUNITY): Payer: Self-pay

## 2023-03-12 NOTE — Telephone Encounter (Signed)
PA has been submitted to [employer] for Medicaid coverage and is pending determination  Confirmation: [id number]

## 2023-03-14 ENCOUNTER — Other Ambulatory Visit: Payer: Self-pay | Admitting: *Deleted

## 2023-03-14 ENCOUNTER — Telehealth: Payer: Self-pay | Admitting: Nurse Practitioner

## 2023-03-14 ENCOUNTER — Other Ambulatory Visit (HOSPITAL_COMMUNITY): Payer: Self-pay

## 2023-03-14 ENCOUNTER — Other Ambulatory Visit: Payer: Self-pay | Admitting: Nurse Practitioner

## 2023-03-14 DIAGNOSIS — J449 Chronic obstructive pulmonary disease, unspecified: Secondary | ICD-10-CM

## 2023-03-14 MED ORDER — TRELEGY ELLIPTA 100-62.5-25 MCG/ACT IN AEPB: 1.0000 | INHALATION_SPRAY | Freq: Every day | RESPIRATORY_TRACT | 0 refills | Status: DC

## 2023-03-14 NOTE — Telephone Encounter (Signed)
PA has been DENIED. No additional information provided from patients insurance. To [place] Medication is covered under patients primary plan.

## 2023-03-14 NOTE — Telephone Encounter (Signed)
Called and asked patient to call his insurance to verify which inhalers are covered, in order to get clarification. He will call us back to let us know.

## 2023-03-14 NOTE — Telephone Encounter (Signed)
Called and spoke with patient and made him aware of the PA information. He stated that he is ok with me calling the pharmacy to see if they run it through [place] only. I advised him I would call the pharmacy and call him back once this has been done.   Called and spoke with the [facility]ist at [facility]. She was able to process the Trelegy using only the [employer] coverage without any issues.   Called and spoke with patient. He is aware of this.   Nothing further needed at time of call.

## 2023-03-14 NOTE — Telephone Encounter (Signed)
Please contact patient and have him contact his insurance company to determine what inhalers are covered. Our pharmacy team had said that Trelegy is covered under his [employer] plan but his pharmacy is saying it is not. He also has Medicaid so unclear what he should be using. Thanks.

## 2023-03-14 NOTE — Telephone Encounter (Signed)
Pt's sister has called the office checking to see if we had an update on the prior authorization that was done for pt's Trelegy.  Please advise on this.

## 2023-03-14 NOTE — Telephone Encounter (Signed)
It looks like Trelegy is covered under his primary plan with [employer]? I'm not sure if it's not being billed properly or what is happening but need to probably call his pharmacy to f/u on this. Thanks.

## 2023-03-16 ENCOUNTER — Other Ambulatory Visit: Payer: Self-pay | Admitting: Nurse Practitioner

## 2023-03-16 DIAGNOSIS — J449 Chronic obstructive pulmonary disease, unspecified: Secondary | ICD-10-CM

## 2023-03-16 MED ORDER — TRELEGY ELLIPTA 100-62.5-25 MCG/ACT IN AEPB: 1.0000 | INHALATION_SPRAY | Freq: Every day | RESPIRATORY_TRACT | 0 refills | Status: DC

## 2023-03-16 NOTE — Telephone Encounter (Signed)
Spoke to the pt's sister and I informed her that samples of trelegy 100 will be waiting for the pt at our front desk. And she can come pick them up anytime. Pt's sister verbalized understanding. Nothing further needed.

## 2023-03-16 NOTE — Telephone Encounter (Signed)
Pt. Needs samples of meds in the mean time till can get insurance straightened out please call sister back and let know  if we can provide

## 2023-03-22 ENCOUNTER — Ambulatory Visit (INDEPENDENT_AMBULATORY_CARE_PROVIDER_SITE_OTHER): Payer: Medicaid Other | Admitting: Nurse Practitioner

## 2023-03-22 ENCOUNTER — Other Ambulatory Visit: Payer: Self-pay | Admitting: Nurse Practitioner

## 2023-03-22 ENCOUNTER — Encounter: Payer: Self-pay | Admitting: Nurse Practitioner

## 2023-03-22 VITALS — BP 140/82 | HR 71 | Temp 98.2°F | Ht 71.0 in | Wt 139.2 lb

## 2023-03-22 DIAGNOSIS — I5032 Chronic diastolic (congestive) heart failure: Secondary | ICD-10-CM

## 2023-03-22 DIAGNOSIS — J449 Chronic obstructive pulmonary disease, unspecified: Secondary | ICD-10-CM

## 2023-03-22 DIAGNOSIS — J9611 Chronic respiratory failure with hypoxia: Secondary | ICD-10-CM | POA: Diagnosis not present

## 2023-03-22 DIAGNOSIS — I10 Essential (primary) hypertension: Secondary | ICD-10-CM | POA: Diagnosis not present

## 2023-03-22 DIAGNOSIS — Z720 Tobacco use: Secondary | ICD-10-CM

## 2023-03-22 MED ORDER — ALBUTEROL SULFATE HFA 108 (90 BASE) MCG/ACT IN AERS: 2.0000 | INHALATION_SPRAY | Freq: Four times a day (QID) | RESPIRATORY_TRACT | 3 refills | Status: DC | PRN

## 2023-03-22 MED ORDER — ARFORMOTEROL TARTRATE 15 MCG/2ML IN NEBU: 15.0000 ug | INHALATION_SOLUTION | Freq: Two times a day (BID) | RESPIRATORY_TRACT | 11 refills | Status: DC

## 2023-03-22 MED ORDER — REVEFENACIN 175 MCG/3ML IN SOLN: 175.0000 ug | Freq: Every day | RESPIRATORY_TRACT | 11 refills | Status: DC

## 2023-03-22 MED ORDER — BUDESONIDE 0.5 MG/2ML IN SUSP: 0.5000 mg | Freq: Two times a day (BID) | RESPIRATORY_TRACT | 11 refills | Status: DC

## 2023-03-22 MED ORDER — REVEFENACIN 175 MCG/3ML IN SOLN: 175.0000 ug | Freq: Every day | RESPIRATORY_TRACT | 0 refills | Status: DC

## 2023-03-22 NOTE — Assessment & Plan Note (Signed)
Stable on 2 to 4 L/min.  No increased O2 requirements.  Goal greater than 88 to 90%.

## 2023-03-22 NOTE — Assessment & Plan Note (Signed)
Improved BP since last visit.  Seems to be doing better on combination of carvedilol, amlodipine and torsemide.  Follow-up with cardiology as scheduled.

## 2023-03-22 NOTE — Patient Instructions (Addendum)
Continue Albuterol inhaler 2 puffs or 3 mL neb every 6 hours as needed for shortness of breath or wheezing. Notify if symptoms persist despite rescue inhaler/neb use. Continue supplemental oxygen 3-4 lpm for goal >88-90% Continue pantoprazole 1 tab daily Continue prednisone 15 mg daily in AM   Continue Trelegy 1 puff daily. Brush tongue and rinse mouth afterwards. You will keep using this until you received your nebulized medications then start... -Budesonide 2 mL neb Twice daily. Brush tongue and rinse mouth afterwards -Arformoterol 2 mL neb Twice daily. Can combine with budesonide -Yupelri 3 mL neb daily. If this is not covered, we can consider alternative inhaler to supplement this medication and continue using the other two nebs  Referral to lung cancer screening program   Follow up in 6 weeks to see how neb treatments are going with Dr. Delton Coombes or Philis Nettle. If symptoms do not improve or worsen, please contact office for sooner follow up or seek emergency care.

## 2023-03-22 NOTE — Assessment & Plan Note (Signed)
Euvolemic on exam.  No evidence of volume overload.  Compensated on current regimen.  Follow-up with cardiology as scheduled.

## 2023-03-22 NOTE — Assessment & Plan Note (Addendum)
Smoking cessation strongly advised.  Continue to smoke between half pack to 1 pack/day.  CT chest from August with stable right lower lobe nodule, considered benign with no dedicated follow-up.  We will enroll him in the lung cancer screening program.  Orders placed today.

## 2023-03-22 NOTE — Progress Notes (Signed)
  ID: Sean Hull, male    DOB: 08-31-1961, 62 y.o.   MRN: [id number]  Chief Complaint  Patient presents with   Follow-up    No change in sx since last ov 02/02/23.  Cough wheeze and SOB persistent    Referring provider: No ref. provider found  HPI: 62 year old male, current everyday smoker (60 pack years, 1.5 pack/day) followed for COPD with emphysema.  He is a patient of Dr. Kavin Leech and last seen in office 02/02/2023 by [facility] NP.  Past medical history significant for mediastinal lymphadenopathy, coronary artery calcification, hypertension, CHF, history of Pseudomonas infection.  TEST/EVENTS:  12/26/2021 CTA chest: No evidence of PE.  There is mild diffuse thickening of the esophagus.  There is a left paratracheal node 1.1 cm.  Right hilar node 1.1 cm.  Both suspected to be reactive.  Clustered AP window lymph nodes.  Similar to prior.  There is biapical pleural-parenchymal scarring.  Prominent emphysema is present.  There is improved aeration in the right middle lobe, although a small amount of airspace opacity persists.  There is an 8 x 7 mm right lower lobe pulmonary nodule, slightly decreased in size from previous scan in 2020.  Some improvement of the adjacent branching nodularity in the left lower lobe previously seen.  There is upper abdominal ascites and mesenteric edema.  Substantial subcutaneous edema along the upper abdomen. 12/27/2021 echocardiogram: EF 50%.  G1 DD.  Unable to measure PASP.  Trivial MR. 04/19/2022 CTA chest: no PE. RML consolidation. Interstitial thickening. 10 mm nodule in RLL, unchanged from 2021 imaging. Reactive LAD.  08/03/2022 Super D CT chest: atherosclerosis. Enlarged pulmonic trunk. Severe emphysema. Scattered parenchymal scarring. 10 mm RLL nodule stable. No new nodules. Previous consolidation resolved. Right adrenal adenoma.   09/20/2022: OV with Dr. Delton Coombes. Some benefit from prednisone. Functional capacity slightly better. Will try decreasing to 15 mg  daily and stay at this dose. Smoking cessation again advised. Pulmonary nodule stable for over 2 years, deemed benign. We will consider referring him to the lung cancer screening program going forward. Given the severity of his disease he may not be a good candidate for any diagnostics if a nodule was found.   12/21/2022: OV with Sean Tanzi NP for follow up with his sister. He has been doing ok since he was here last. Hasn't required any steroids or abx. No hospitalizations. He did run out of his Breztri 3 weeks ago and insurance has told him that it's no longer covered. Since then, he's felt more short winded especially over the past few days. Has noticed more wheezing too. Using his rescue multiple times a day. He has not had any change in his chronic cough. No increased chest congestion, fevers, chills, hemoptysis. He is on prednisone 15 mg daily.  He had elevated BP in office today. Upon recheck was still 180/90. Upon review of his chart, his PCP discontinued his losartan and started him on metoprolol in September. There was note of tachycardia but HR was documented at 84 and he doesn't recall this. Since then, his pressures have been running on the higher side. He hasn't been back to see cardiology. He denies headaches, N/V, dizziness, syncope, chest pain, palpitations. Admits compliance to his metoprolol and fluid pills. No leg swelling, weight gain, or orthopnea today.   02/02/2023: OV with Sean Rewis NP for follow up with his sister. Since our last visit, he continued to struggle with his blood pressure. He saw cardiology who adjusted his antihypertensives earlier today.  He's also been off of his maintenance inhaler. He was supposed to change to Trelegy due to insurance but never did. Feels like over the past week, he's been more short winded. Denies increased cough, chest congestion, or wheezing. No leg swelling. He has been using his rescue inhaler and nebulizer.   03/22/2023: Today - follow up Patient presents  today for follow-up with his sister.  At her last visit, we restarted his Trelegy inhaler.  He has had multiple issues with his insurance covering this.  Medicaid denied a prior authorization and even though it shows that this is covered on his [employer]et[employer] plan, he was going to be charged for $600 for it.  He has been able to receive samples from our office so has been using these over the past couple of weeks.  Feels like his breathing is at his baseline when compared to the past 6 months as long as he has some form of maintenance inhaler. Doesn't notice much difference between Trelegy and Breztri.  Still has high symptom burden and gets winded with minimal activity.  Cough is unchanged and at his baseline.  Denies any increased production or frequency.  He is not having much chest congestion or wheezing.  Blood pressure seems to be better controlled recently not having any leg swelling.  Does need a refill of his rescue inhaler.  He uses his neb treatments at home.  These seem to work quite well for him.  He continues to smoke 1 pack/day. Last CT in August 2023 showed a stable 10 mm right lower lobe lung nodule, that was definitively considered benign.  No Known Allergies  Immunization History  Administered Date(s) Administered   Influenza,inj,Quad PF,6+ Mos 11/10/2019, 12/29/2021, 09/20/2022   Influenza-Unspecified 11/10/2019   Pneumococcal Polysaccharide-23 11/10/2019    Past Medical History:  Diagnosis Date   Acute respiratory failure with hypoxia    Anxiety    Arthritis    hands   CAP (community acquired pneumonia)    Cataract    bilateral-removed   COPD (chronic obstructive pulmonary disease)    Dyspnea    with exertion   Emphysema of lung    GERD (gastroesophageal reflux disease)    Hypertension    Oxygen deficiency    Pneumonia    Substance use disorder    pt.denies as of 01/25/22   Tobacco use     Tobacco History: Social History   Tobacco Use  Smoking Status Every Day    Packs/day: 1.00   Years: 40.00   Additional pack years: 0.00   Total pack years: 40.00   Types: Cigarettes   Passive exposure: Past  Smokeless Tobacco Never  Tobacco Comments   0.5-1ppd  03/22/2023  am   Ready to quit: Not Answered Counseling given: Not Answered Tobacco comments: 0.5-1ppd  03/22/2023  am   Outpatient Medications Prior to Visit  Medication Sig Dispense Refill   acetaminophen (TYLENOL) 325 MG tablet Take 650 mg by mouth every 6 (six) hours as needed.     albuterol (PROVENTIL) (2.5 MG/3ML) 0.083% nebulizer solution Take 3 mLs (2.5 mg total) by nebulization every 6 (six) hours as needed for wheezing or shortness of breath. 75 mL 12   amLODipine (NORVASC) 10 MG tablet Take 1 tablet (10 mg total) by mouth daily. 90 tablet 3   atorvastatin (LIPITOR) 20 MG tablet Take 1 tablet (20 mg total) by mouth daily. 90 tablet 3   carvedilol (COREG) 25 MG tablet Take 1 tablet (25 mg total) by  mouth 2 (two) times daily. 180 tablet 3   OXYGEN Inhale 3-4 L into the lungs.     pantoprazole (PROTONIX) 40 MG tablet Take 1 tablet (40 mg total) by mouth daily. Take 30 minutes before breakfast 90 tablet 5   predniSONE (DELTASONE) 5 MG tablet Take 3 tablets (15 mg total) by mouth daily with breakfast. 90 tablet 5   torsemide (DEMADEX) 20 MG tablet TAKE 1 TABLET(20 MG) BY MOUTH DAILY 90 tablet 3   albuterol (VENTOLIN HFA) 108 (90 Base) MCG/ACT inhaler Inhale 2 puffs into the lungs every 6 (six) hours as needed for wheezing or shortness of breath. 18 g 3   Fluticasone-Umeclidin-Vilant (TRELEGY ELLIPTA) 100-62.5-25 MCG/ACT AEPB Inhale 1 puff into the lungs daily. 60 each 5   Fluticasone-Umeclidin-Vilant (TRELEGY ELLIPTA) 100-62.5-25 MCG/ACT AEPB Inhale 1 puff into the lungs daily. (Patient not taking: Reported on 03/22/2023) 14 each 0   Fluticasone-Umeclidin-Vilant (TRELEGY ELLIPTA) 100-62.5-25 MCG/ACT AEPB Inhale 1 each into the lungs daily. (Patient not taking: Reported on 03/22/2023) 1 each 0   No  facility-administered medications prior to visit.     Review of Systems:   Constitutional: No weight loss or gain, night sweats, fevers, chills +fatigue (baseline) HEENT: No headaches, difficulty swallowing, tooth/dental problems, or sore throat. No sneezing, itching, ear ache, nasal congestion, or post nasal drip CV:  No chest pain, swelling lower extremities, orthopnea, PND, anasarca, dizziness, palpitations, syncope Resp: +shortness of breath with exertion; productive AM cough (baseline). No wheeze.   No hemoptysis.  No chest wall deformity GI:  No heartburn, indigestion, abdominal pain, nausea, vomiting, diarrhea, change in bowel habits, loss of appetite, bloody stools.  GU: No dysuria, change in color of urine, urgency or frequency. Skin: No rash, lesions, ulcerations MSK:  No joint pain or swelling.   Neuro: No dizziness or lightheadedness.  Psych: No depression or anxiety. Mood stable.     Physical Exam:  BP (!) 140/82 (BP Location: Left Arm, Patient Position: Sitting, Cuff Size: Normal)   Pulse 71   Temp 98.2 F (36.8 C) (Oral)   Ht  (1.803 m)   Wt 139 lb 3.2 oz (63.1 kg)   SpO2 99% Comment: on 2.5 lpm continuous tank  BMI 19.41 kg/m   GEN: Pleasant, interactive, chronically-ill appearing; in no acute distress. HEENT:  Normocephalic and atraumatic. PERRLA. Sclera white. Nasal turbinates pink, moist and patent bilaterally. No rhinorrhea present. Oropharynx pink and moist, without exudate or edema. No lesions, ulcerations, or postnasal drip.  NECK:  Supple w/ fair ROM. No JVD present. Normal carotid impulses w/o bruits. Thyroid symmetrical with no goiter or nodules palpated. No lymphadenopathy.   CV: RRR, no m/r/g, peripheral edema. Pulses intact, +2 bilaterally. No cyanosis, pallor or clubbing. PULMONARY: Unlabored, regular. Diminished bilaterally A&P. No accessory muscle use. No dullness to percussion. GI: BS present and normoactive. Soft, non-tender to palpation.   MSK: No erythema, warmth or tenderness. Cap refil <2 sec all extrem. No deformities or joint swelling noted. Muscle wasting Neuro: A/Ox3. No focal deficits noted.   Skin: Warm, no lesions or rashe Psych: Normal affect and behavior. Judgement and thought content appropriate.     Lab Results:  CBC    Component Value Date/Time   WBC 8.3 05/17/2022 1547   WBC 10.5 04/17/2022 1517   RBC 4.56 05/17/2022 1547   RBC 4.44 04/17/2022 1517   HGB 13.5 05/17/2022 1547   HCT 42.0 05/17/2022 1547   PLT 261 05/17/2022 1547   MCV 92 05/17/2022 1547  MCH 29.6 05/17/2022 1547   MCH 30.7 12/27/2021 0330   MCHC 32.1 05/17/2022 1547   MCHC 31.2 04/17/2022 1517   RDW 15.8 (H) 05/17/2022 1547   LYMPHSABS 0.6 (L) 04/17/2022 1517   LYMPHSABS 1.1 01/06/2022 1446   MONOABS 0.6 04/17/2022 1517   EOSABS 0.0 04/17/2022 1517   EOSABS 0.0 01/06/2022 1446   BASOSABS 0.0 04/17/2022 1517   BASOSABS 0.1 01/06/2022 1446    BMET    Component Value Date/Time   NA 143 07/20/2022 1615   K 5.0 07/20/2022 1615   CL 97 07/20/2022 1615   CO2 31 (H) 07/20/2022 1615   GLUCOSE 99 07/20/2022 1615   GLUCOSE 68 (L) 04/27/2022 1655   BUN 17 07/20/2022 1615   CREATININE 1.02 07/20/2022 1615   CALCIUM 9.2 07/20/2022 1615   GFRNONAA >60 12/29/2021 0343   GFRAA >60 02/10/2019 1138    BNP    Component Value Date/Time   BNP 1,868.7 (H) 12/26/2021 1056     Imaging:  No results found.         No data to display          No results found for: "NITRICOXIDE"      Assessment & Plan:   COPD, very severe (HCC) Very severe COPD with high symptom burden.  We have had a lot of trouble with inhaler coverage recently.  He is currently on Trelegy via samples provided by our office.  His Medicaid had denied the Trelegy and will not cover Breztri either.  He has been tried on dual therapy inhalers in the past without much success.  He does seem to get some better benefit from breathing treatments.  We will  transitioned him to triple therapy nebs.  Did discuss that Yupelri may or may not be covered under his current Medicaid plan.  If not, he will use Brovana and budesonide nebs and then we will start him on an approved LAMA inhaler.  Hopefully with this combination, we will not have any further insurance issues and he will not have any more lapses in maintenance therapy.  Does not appear to be in acute exacerbation today.  Action plan in place.  Rx refilled for albuterol.  We again discussed the importance of smoking cessation and the role this is playing in his significant dyspnea.  He verbalized understanding.  Still has no significant desire to quit at this time.  Patient Instructions  Continue Albuterol inhaler 2 puffs or 3 mL neb every 6 hours as needed for shortness of breath or wheezing. Notify if symptoms persist despite rescue inhaler/neb use. Continue supplemental oxygen 3-4 lpm for goal >88-90% Continue pantoprazole 1 tab daily Continue prednisone 15 mg daily in AM   Continue Trelegy 1 puff daily. Brush tongue and rinse mouth afterwards. You will keep using this until you received your nebulized medications then start... -Budesonide 2 mL neb Twice daily. Brush tongue and rinse mouth afterwards -Arformoterol 2 mL neb Twice daily. Can combine with budesonide -Yupelri 3 mL neb daily. If this is not covered, we can consider alternative inhaler to supplement this medication and continue using the other two nebs  Referral to lung cancer screening program   Follow up in 6 weeks to see how neb treatments are going with Dr. Delton Coombes or Philis Nettle. If symptoms do not improve or worsen, please contact office for sooner follow up or seek emergency care.    Chronic respiratory failure with hypoxia (HCC) Stable on 2 to 4 L/min.  No  increased O2 requirements.  Goal greater than 88 to 90%.  CHF (congestive heart failure) (HCC) Euvolemic on exam.  No evidence of volume overload.  Compensated on current  regimen.  Follow-up with cardiology as scheduled.  Hypertension Improved BP since last visit.  Seems to be doing better on combination of carvedilol, amlodipine and torsemide.  Follow-up with cardiology as scheduled.  Tobacco abuse Smoking cessation strongly advised.  Continue to smoke between half pack to 1 pack/day.  CT chest from August with stable right lower lobe nodule, considered benign with no dedicated follow-up.  We will enroll him in the lung cancer screening program.  Orders placed today.   I spent 35 minutes of dedicated to the care of this patient on the date of this encounter to include pre-visit review of records, face-to-face time with the patient discussing conditions above, post visit ordering of testing, clinical documentation with the electronic health record, making appropriate referrals as documented, and communicating necessary findings to members of the patients care team.  Noemi Chapel, NP 03/22/2023  Pt aware and understands NP's role.

## 2023-03-22 NOTE — Assessment & Plan Note (Signed)
Very severe COPD with high symptom burden.  We have had a lot of trouble with inhaler coverage recently.  He is currently on Trelegy via samples provided by our office.  His Medicaid had denied the Trelegy and will not cover Breztri either.  He has been tried on dual therapy inhalers in the past without much success.  He does seem to get some better benefit from breathing treatments.  We will transitioned him to triple therapy nebs.  Did discuss that Yupelri may or may not be covered under his current Medicaid plan.  If not, he will use Brovana and budesonide nebs and then we will start him on an approved LAMA inhaler.  Hopefully with this combination, we will not have any further insurance issues and he will not have any more lapses in maintenance therapy.  Does not appear to be in acute exacerbation today.  Action plan in place.  Rx refilled for albuterol.  We again discussed the importance of smoking cessation and the role this is playing in his significant dyspnea.  He verbalized understanding.  Still has no significant desire to quit at this time.  Patient Instructions  Continue Albuterol inhaler 2 puffs or 3 mL neb every 6 hours as needed for shortness of breath or wheezing. Notify if symptoms persist despite rescue inhaler/neb use. Continue supplemental oxygen 3-4 lpm for goal >88-90% Continue pantoprazole 1 tab daily Continue prednisone 15 mg daily in AM   Continue Trelegy 1 puff daily. Brush tongue and rinse mouth afterwards. You will keep using this until you received your nebulized medications then start... -Budesonide 2 mL neb Twice daily. Brush tongue and rinse mouth afterwards -Arformoterol 2 mL neb Twice daily. Can combine with budesonide -Yupelri 3 mL neb daily. If this is not covered, we can consider alternative inhaler to supplement this medication and continue using the other two nebs  Referral to lung cancer screening program   Follow up in 6 weeks to see how neb treatments are  going with Dr. Delton Coombes or Philis Nettle. If symptoms do not improve or worsen, please contact office for sooner follow up or seek emergency care.

## 2023-03-23 ENCOUNTER — Telehealth: Payer: Self-pay

## 2023-03-23 ENCOUNTER — Other Ambulatory Visit (HOSPITAL_COMMUNITY): Payer: Self-pay

## 2023-03-23 ENCOUNTER — Other Ambulatory Visit: Payer: Self-pay | Admitting: Nurse Practitioner

## 2023-03-23 DIAGNOSIS — J449 Chronic obstructive pulmonary disease, unspecified: Secondary | ICD-10-CM

## 2023-03-23 NOTE — Telephone Encounter (Signed)
PA request received via pharmacy/provider for arformoterol (BROVANA) 15 MCG/2ML NEBU   *patient not under control with albuterol and duoneb  PA has been submitted to [facility] [employer] for secondary coverage and has been APPROVED from 03/23/2023-03/22/2024  Confirmation: [id number] W

## 2023-03-23 NOTE — Telephone Encounter (Signed)
[place] has [place] and [place]. Monia Pouch is primary and does not require PA.

## 2023-03-23 NOTE — Telephone Encounter (Signed)
Can we file a prior auth for this? He has tried albuterol nebs and duonebs, which are not controlling him.

## 2023-03-28 ENCOUNTER — Other Ambulatory Visit (HOSPITAL_COMMUNITY): Payer: Self-pay

## 2023-03-28 NOTE — Telephone Encounter (Signed)
Test claims show the following results for a couple alternatives:   Spiriva Respimat $4.00 Perforomist, Serevent show patient 100% co-pay but I think this is because something was already filled. Does not give an idea of a co-pay. Rosalyn Gess Shows filled on 03-23-2023 and next fill is 04-16-2023  I am assuming this is resolved as Rosalyn Gess was called in and filled

## 2023-04-12 ENCOUNTER — Other Ambulatory Visit: Payer: Self-pay | Admitting: Emergency Medicine

## 2023-04-13 ENCOUNTER — Telehealth: Payer: Self-pay | Admitting: Nurse Practitioner

## 2023-04-13 NOTE — Telephone Encounter (Signed)
Sean Hull states patient needs walk test. Rachelle phone number is [phone].

## 2023-04-17 ENCOUNTER — Telehealth: Payer: Self-pay | Admitting: Nurse Practitioner

## 2023-04-17 NOTE — Telephone Encounter (Signed)
Angelique Blonder sister would like to know patient's plan of care. Denise phone number is [phone].

## 2023-04-17 NOTE — Telephone Encounter (Signed)
ATC X1 LVM for Fleet Contras at [facility] Patient

## 2023-04-18 ENCOUNTER — Other Ambulatory Visit (HOSPITAL_COMMUNITY): Payer: Self-pay

## 2023-04-18 NOTE — Telephone Encounter (Signed)
Called and spoke with Angelique Blonder (on [place]). She stated that the patient is scheduled for a follow up later this month and he hasn't received any of his nebulizer medicines. I did review his chart and see that the Rosalyn Gess had been approved last month but did not see any PA requests for the Pulmicort or Yupelri. I advised her that I would call the pharmacy to check on the status of these. She verbalized understanding.   [facility] and spoke with the pharmacy tech. She stated that they had called the office on 04/19 to let us know that the Yupelri and Pulmicort both needed PAs but they hadn't heard anything from [place]. I don't see any documentation of this.   I called Angelique Blonder back but she did not answer.   Will send a message to the PA team to see if they can do a PA on the Yupelri and Pulmicort 0.5mg .

## 2023-04-18 NOTE — Telephone Encounter (Signed)
Medicaid now showing patient has another primary insurance, patient must provide insurance information or contact Medicaid to resolve.

## 2023-05-01 IMAGING — CT CT CHEST SUPER D W/O CM
2 of 5 series · 15 of 36 positions shown, 18 images · non-contrast
Comparison: CT chest dated April 07, 2020

CLINICAL DATA: Evaluate lung nodule

EXAM:
CT CHEST WITHOUT CONTRAST
TECHNIQUE: Multidetector CT imaging of the chest was performed using thin slice
collimation for electromagnetic bronchoscopy planning purposes,
without intravenous contrast.

[Series 4: thins · axial · 0.73mm/px · z∈[-357,-42]mm · 12 of 456 slices shown, 15 images]
[im 31/456  mediastinal]
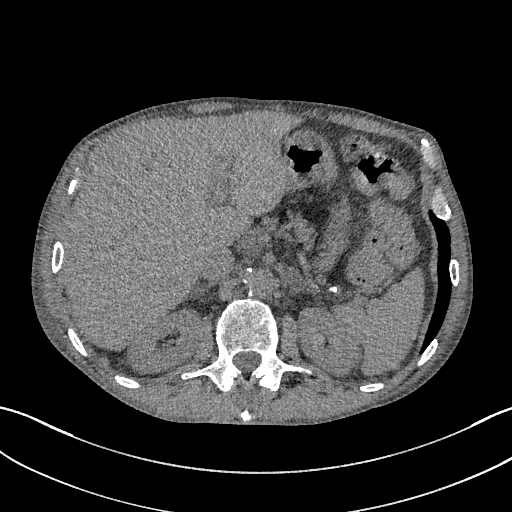
[im 31/456  lung]
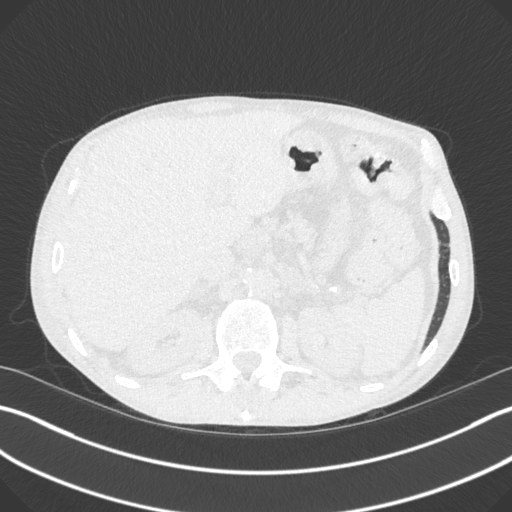
[im 61/456  lung]
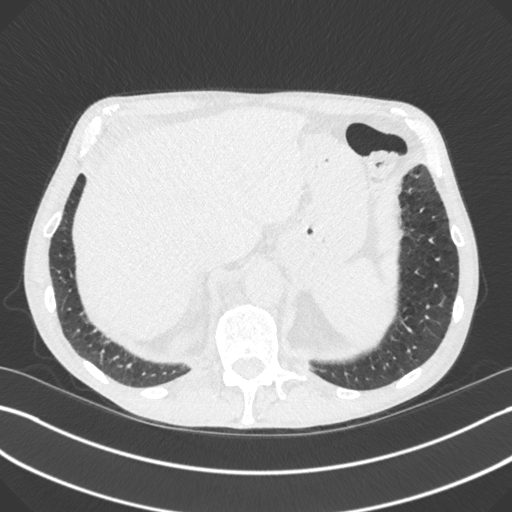
[im 92/456  lung]
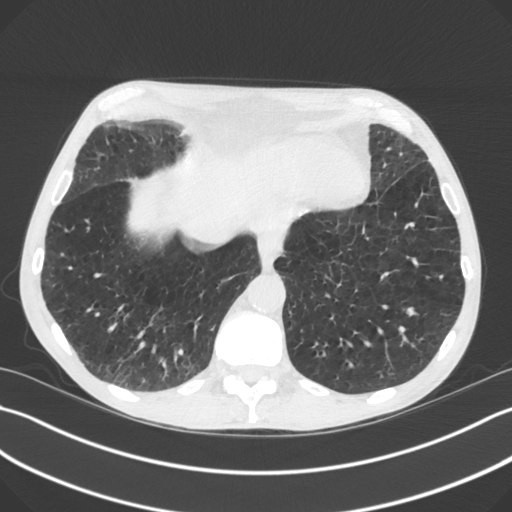
[im 152/456  lung]
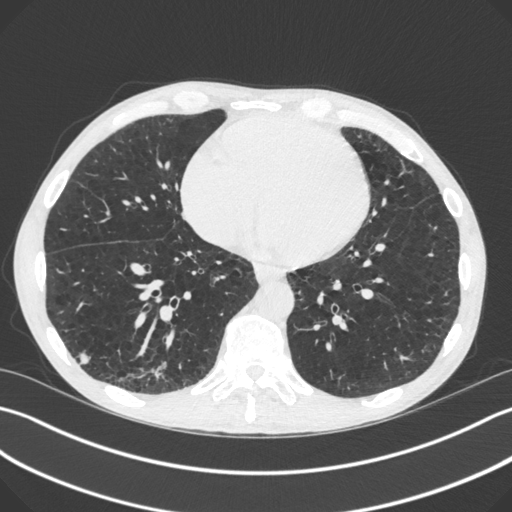
[im 183/456  mediastinal]
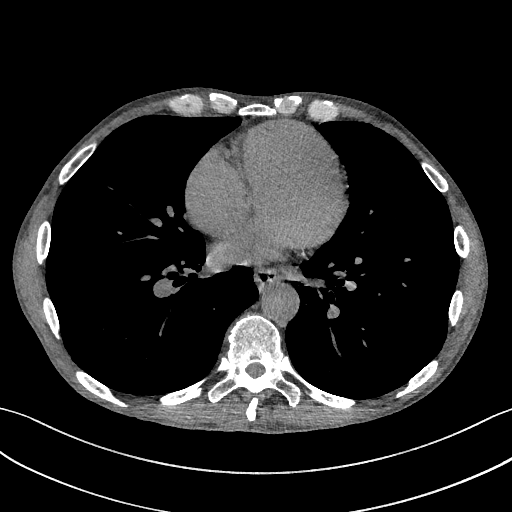
[im 183/456  lung]
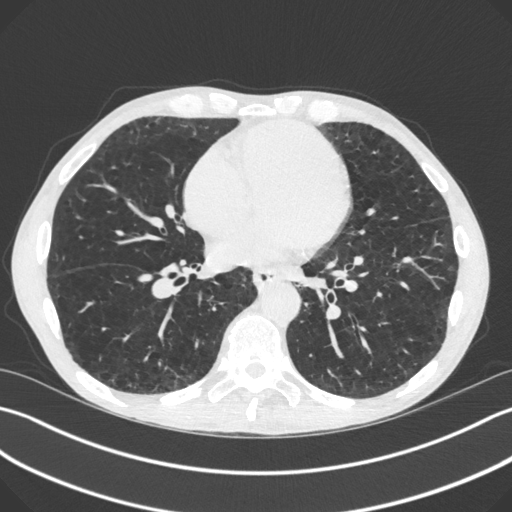
[im 213/456  lung]
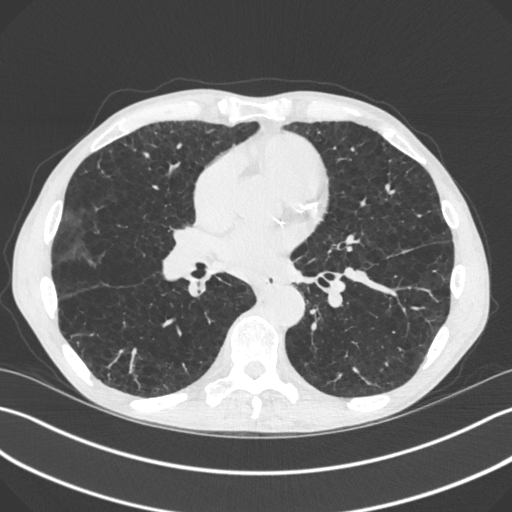
[im 243/456  lung]
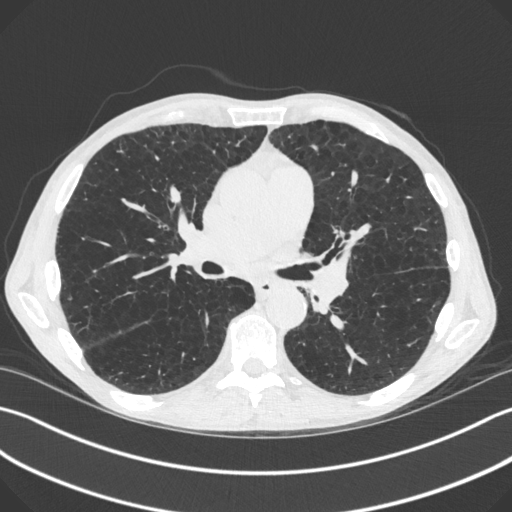
[im 274/456  lung]
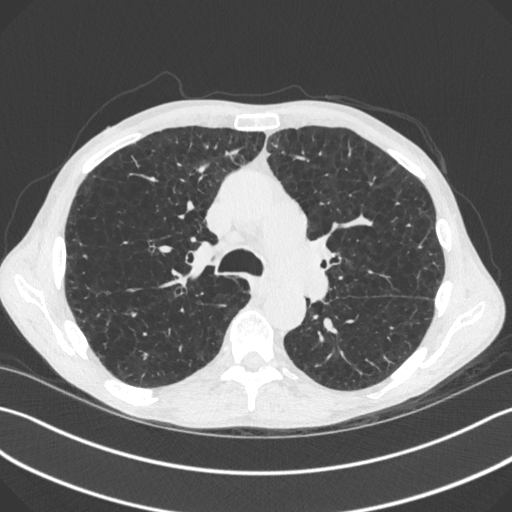
[im 304/456  mediastinal]
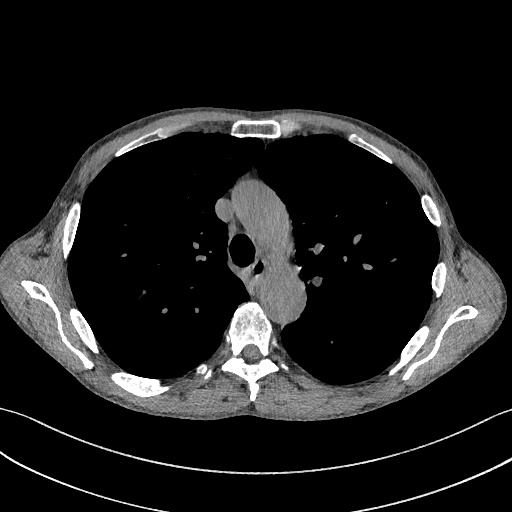
[im 304/456  lung]
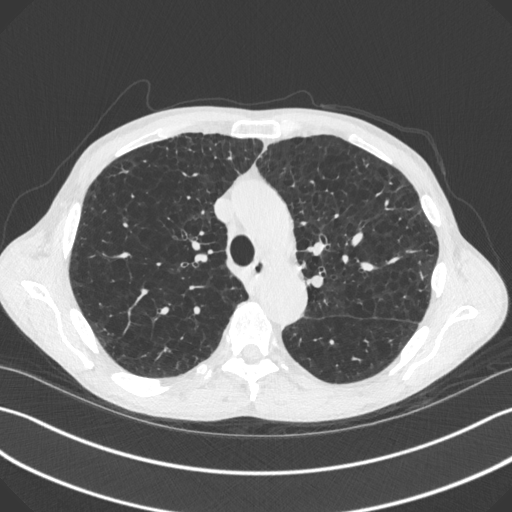
[im 365/456  lung]
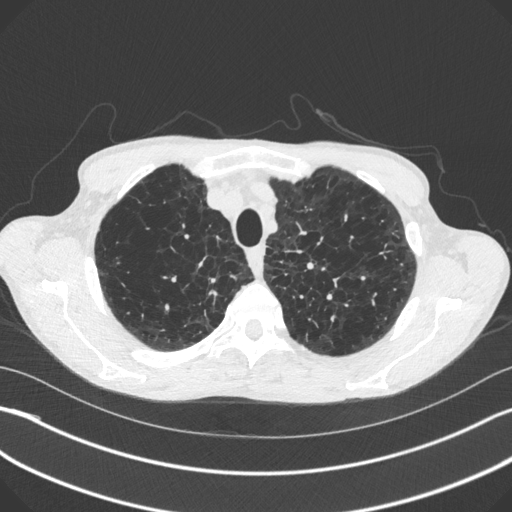
[im 395/456  lung]
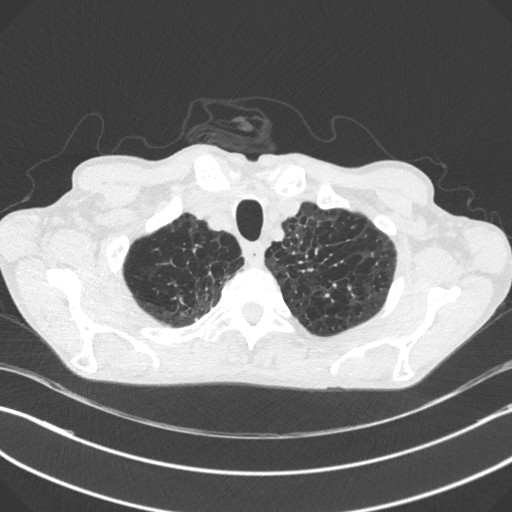
[im 425/456  lung]
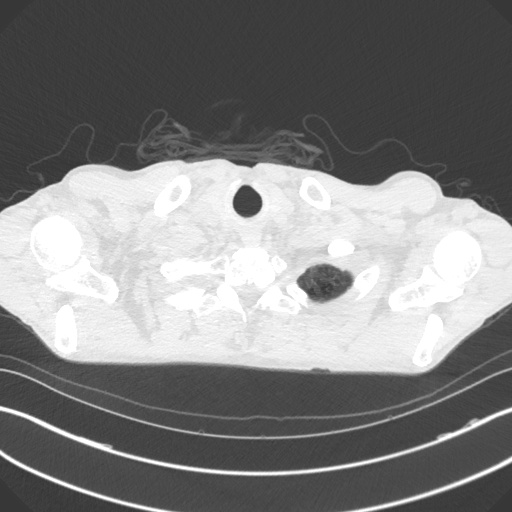

[Series 5: coronal · coronal · 0.71mm/px · 3 of 83 slices shown]
[im 17/83  lung]
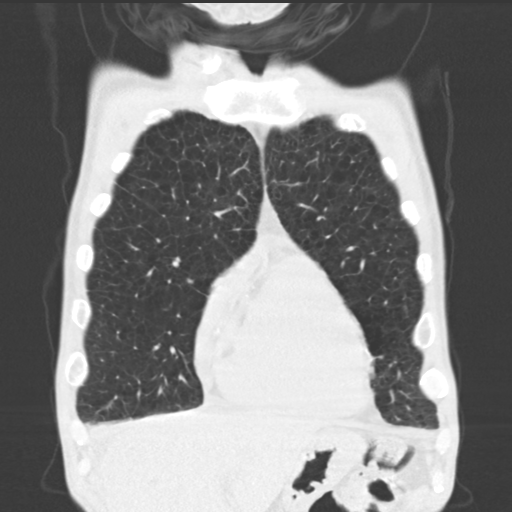
[im 33/83  lung]
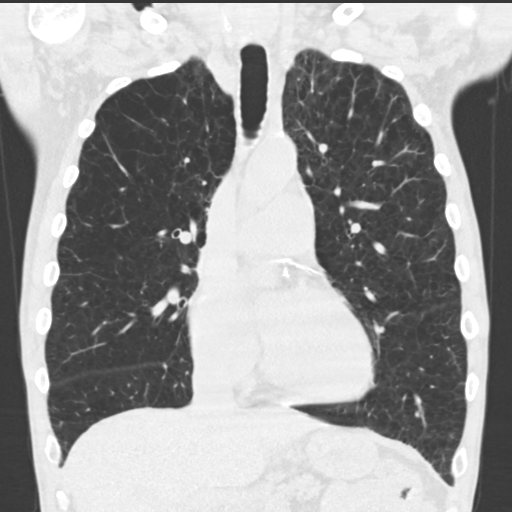
[im 50/83  lung]
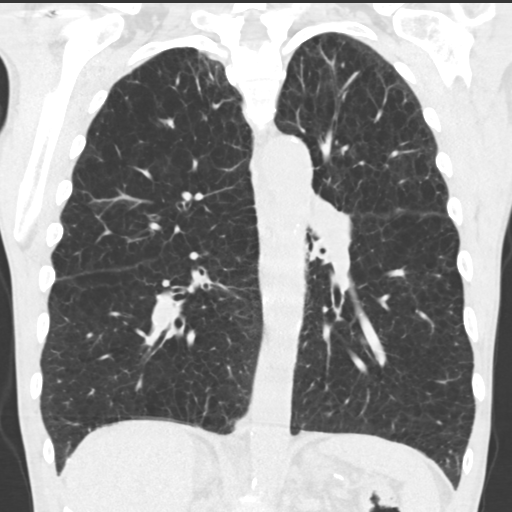

[15 of 36 positions shown; findings below may reference images not displayed]

FINDINGS: Cardiovascular: Normal heart size. Three-vessel coronary artery
calcifications. No pericardial effusion.

Mediastinum/Nodes: No pathologically enlarged lymph nodes seen in
the chest. Esophagus and thyroid are unremarkable.

Lungs/Pleura: Central airways are patent. Severe upper lobe
predominant centrilobular emphysema. Stable right lower lobe
pulmonary nodule measuring 8 mm on series 3, image 122. Additional
smaller nodules are seen in the right lower lobe are stable.
Reference right lower lobe nodule measuring 4 mm on image 139.

Upper Abdomen: Trace perihepatic fluid.

Musculoskeletal: No chest wall mass or suspicious bone lesions
identified.
IMPRESSION: Stable solid nodule of the right lower lobe. Additional non-contrast
chest CT at is recommended in 12 months to document 2 year
stability. This recommendation follows the consensus statement:
Guidelines for Management of Incidental Pulmonary Nodules Detected
[DATE].

Trace perihepatic fluid.

Coronary artery calcifications and aortic Atherosclerosis
(5I44J-5KL.L).

Emphysema (5I44J-0SO.H).

## 2023-05-02 ENCOUNTER — Encounter: Payer: Self-pay | Admitting: Emergency Medicine

## 2023-05-02 ENCOUNTER — Ambulatory Visit (INDEPENDENT_AMBULATORY_CARE_PROVIDER_SITE_OTHER): Payer: Medicaid Other | Admitting: Emergency Medicine

## 2023-05-02 VITALS — BP 128/78 | Temp 98.4°F | Ht 71.0 in | Wt 140.0 lb

## 2023-05-02 DIAGNOSIS — J9611 Chronic respiratory failure with hypoxia: Secondary | ICD-10-CM | POA: Diagnosis not present

## 2023-05-02 DIAGNOSIS — Z720 Tobacco use: Secondary | ICD-10-CM

## 2023-05-02 DIAGNOSIS — J449 Chronic obstructive pulmonary disease, unspecified: Secondary | ICD-10-CM | POA: Diagnosis not present

## 2023-05-02 MED ORDER — IPRATROPIUM-ALBUTEROL 0.5-2.5 (3) MG/3ML IN SOLN: 3.0000 mL | Freq: Four times a day (QID) | RESPIRATORY_TRACT | Status: DC

## 2023-05-02 MED ORDER — TRELEGY ELLIPTA 100-62.5-25 MCG/ACT IN AEPB: 1.0000 | INHALATION_SPRAY | Freq: Every day | RESPIRATORY_TRACT | 0 refills | Status: DC

## 2023-05-02 MED ORDER — IPRATROPIUM-ALBUTEROL 0.5-2.5 (3) MG/3ML IN SOLN: 3.0000 mL | Freq: Four times a day (QID) | RESPIRATORY_TRACT | 3 refills | Status: DC

## 2023-05-02 MED ORDER — PREDNISONE 5 MG PO TABS
15.0000 mg | ORAL_TABLET | Freq: Every day | ORAL | 1 refills | Status: DC
Start: 2023-05-02 — End: 2023-07-07

## 2023-05-02 NOTE — Progress Notes (Signed)
Subjective:    Patient ID: Sean Hull, male    DOB: 01-06-1961, 62 y.o.   MRN: [id number]  HPI   ROV 09/20/22 --follow-up visit for 62 year old gentleman with a history of tobacco use, substance abuse.  I saw him originally in the aftermath of a community-acquired pneumonia.  He had negative EBUS on 02/12/2019 for associated lymphadenopathy.  We have been following pulmonary nodular disease with serial CT imaging, over 2 years of stability on his most recent CT from 08/03/2022.  He is currently managed on Breztri.  He continued to have severe symptoms, bronchitic symptoms and I started him on prednisone at his last visit 1 month ago to see if he will get benefit.  He returns today to discuss. He continues to smoke.  We set a goal last time for him to get down to 10 cigarettes daily by this visit.  Still smoking more than 1/2 pk daily. He reports that his energy has benefited some. Hasn't done much for his overall breathing. Increased appetite, some decreased sleep. O2 is at 3-4L/min.  Flu shot today   ROV 05/02/2023 -- 62 year old man, active smoker (60 pack years) with associated COPD.  Admitted originally after community-acquired pneumonia and a negative EBUS that was done for some associated lymphadenopathy in March 2020.  Remains quite symptomatic.  He is on maintenance prednisone 15 mg daily.  Had been managed on Breztri and then Trelegy but changed most recently to nebulized regimen of Brovana, Pulmicort, Yupelri > has not been able to obtain due to insurance snags. He has stayed on Trelegy waiting for this to be addressed. His O2 is 3-4L/min. Minimal cough. Still smoking 10+ cigarettes.       Objective:   Physical Exam  Vitals:   05/02/23 1447  BP: 128/78  Temp: 98.4 F (36.9 C)  TempSrc: Oral  Weight: 140 lb (63.5 kg)  Height: 5\' 11"  (1.803 m)   Gen: Pleasant, very thin, in no distress,  normal affect  ENT: No lesions,  mouth clear,  no postnasal drip  Neck: No JVD, no  stridor  Lungs: No use of accessory muscles, distant, bilateral coarse breath sounds with end expiratory wheeze  Cardiovascular: RRR, heart sounds normal, no murmur or gallops, no peripheral edema  Musculoskeletal: No deformities, no cyanosis or clubbing  Neuro: alert, awake, non focal  Skin: Warm, no lesions or rash     Assessment & Plan:  COPD, very severe (HCC) Debilitating COPD.  Prednisone dependent, currently on 15 mg.  Had tried to change him to long-acting nebulized regimen but he has been unable to obtain due to insurance constraints and obstacles.  He has been using left upper Trelegy in the interim.  I will try changing him to scheduled DuoNeb 4 times daily, albuterol either nebulized or HFA as rescue.  Continue the oral corticosteroid, hold off on ICS for now since he is on the prednisone.  He knows that he needs to stop smoking but he has been unable.  We discussed that again today.  Follow-up in 1 month to ensure that he gets on the nebulized medicines.  At that time we can make a critical assessment whether to increase his prednisone from 15 to 20 mg.  Chronic respiratory failure with hypoxia (HCC) Continue oxygen 3-4 L/min depending on exertional level  Tobacco abuse Smoking 10 cigarettes daily.  Unfortunately he does not believe he can cut down any further.  Levy Pupa, MD, PhD 05/02/2023, 3:15 PM [facility] [phone] or if no answer [phone]

## 2023-05-02 NOTE — Patient Instructions (Addendum)
We will hold off on starting Yupelri, Brovana, Pulmicort nebulizer regimen. Please start DuoNeb 4 times a day on a schedule. You can use your albuterol either 1 nebulizer treatment or 2 puffs up to every 4 hours if needed for shortness of breath, chest tightness, wheezing.  You can take this medication in between your other scheduled nebulizers if needed. Continue prednisone 15 mg daily Continue oxygen 3-4 L/min depending on your level of exertion. Follow with APP in 1 month Follow Dr. Delton Coombes in 6 months, sooner if you have problems.

## 2023-05-02 NOTE — Addendum Note (Signed)
Addended by: Marylynn Pearson on: 05/02/2023 03:35 PM   Modules accepted: Orders

## 2023-05-02 NOTE — Assessment & Plan Note (Signed)
Smoking 10 cigarettes daily.  Unfortunately he does not believe he can cut down any further.

## 2023-05-02 NOTE — Assessment & Plan Note (Signed)
Debilitating COPD.  Prednisone dependent, currently on 15 mg.  Had tried to change him to long-acting nebulized regimen but he has been unable to obtain due to insurance constraints and obstacles.  He has been using left upper Trelegy in the interim.  I will try changing him to scheduled DuoNeb 4 times daily, albuterol either nebulized or HFA as rescue.  Continue the oral corticosteroid, hold off on ICS for now since he is on the prednisone.  He knows that he needs to stop smoking but he has been unable.  We discussed that again today.  Follow-up in 1 month to ensure that he gets on the nebulized medicines.  At that time we can make a critical assessment whether to increase his prednisone from 15 to 20 mg.

## 2023-05-02 NOTE — Assessment & Plan Note (Signed)
Continue oxygen 3-4 L/min depending on exertional level

## 2023-05-08 ENCOUNTER — Ambulatory Visit: Payer: Medicaid Other

## 2023-05-15 ENCOUNTER — Encounter: Payer: Self-pay | Admitting: Internal Medicine

## 2023-06-01 ENCOUNTER — Telehealth: Payer: Self-pay | Admitting: Nurse Practitioner

## 2023-06-01 NOTE — Telephone Encounter (Signed)
Pt daughter states he has a lot of fluid build up and his back is so swollen, he can't get up and his feet are swelled up really big, and pt refuses to got to er

## 2023-06-01 NOTE — Telephone Encounter (Signed)
Spoke with pt who states that his lower legs are swollen. He states he has been taking toresmide as directed but it does not seem be taking the fluid off. Pt was instructed to seek medical evaluation at closest ED. Pt stated that "he wasn't promising he would go." I restated that our recommendation was to seek emergent medical evaluation at local ED. Pt stated understanding. Nothing further needed at this time.   Routing to Dr. Delton Coombes as Lorain Childes

## 2023-06-01 NOTE — Telephone Encounter (Signed)
I agree, thank you.

## 2023-06-04 ENCOUNTER — Ambulatory Visit: Payer: Medicaid Other | Admitting: Nurse Practitioner

## 2023-06-08 ENCOUNTER — Telehealth: Payer: Self-pay | Admitting: Gastroenterology

## 2023-06-08 NOTE — Telephone Encounter (Signed)
Spoke with patient's sister & advised her that patient would need to be seen first before scheduling procedure. It's been over a year since he was last seen & at his last visit it was recommended to hold off on endoscopic procedures d/t pulmonary status. She mentioned that she has a couple hour drive & if possible if future scans are ordered if it could be done at a closer location. Advised her to discuss this at time of appointment & we can work on arranging it closer to her if needed. Sister verbalized all understanding.

## 2023-06-08 NOTE — Telephone Encounter (Signed)
Inbound call from patients sister wanting to schedule patient for EGD and colonoscopy. Patient is scheduled for a office visit on 7/24 with Quentin Mulling for abdominal pain. Patients sister is requesting a call back to discuss if she can go ahead and schedule the procedures or if she needs to wait until the office visit. Requesting a call at  [phone]. Please advise.

## 2023-06-12 ENCOUNTER — Other Ambulatory Visit: Payer: Self-pay | Admitting: *Deleted

## 2023-06-12 DIAGNOSIS — Z87891 Personal history of nicotine dependence: Secondary | ICD-10-CM

## 2023-06-12 DIAGNOSIS — Z122 Encounter for screening for malignant neoplasm of respiratory organs: Secondary | ICD-10-CM

## 2023-06-12 DIAGNOSIS — F17210 Nicotine dependence, cigarettes, uncomplicated: Secondary | ICD-10-CM

## 2023-06-17 ENCOUNTER — Other Ambulatory Visit: Payer: Self-pay | Admitting: Nurse Practitioner

## 2023-06-20 ENCOUNTER — Ambulatory Visit: Payer: Medicaid Other | Admitting: Nurse Practitioner

## 2023-06-26 NOTE — Progress Notes (Unsigned)
06/27/2023 Sean Hull [id number] [birth year]  Referring provider: No ref. provider found Primary GI doctor: Dr. Chales Abrahams  ASSESSMENT AND PLAN:   Gastroesophageal reflux disease, on prednisone 15 mg daily x 6 months, on goody powder PRN, continues to smoke, 2-3 beers daily Very high risk for peptic ulcer disease with risk factors above, increase Protonix to twice daily, stop Goody powders advised stop drinking smoking. Patient too high risk for endoscopic procedures, ER precautions discussed with patient.  Left lower quadrant abdominal pain with nausea, alternating diarrhea constipation Patient is been wheelchair-bound for the last 6 months to a year, decreased movement, I do believe this is more constipation with abdominal pain and overflow.  Will get x-ray. Given information. Will also get CT abdomen pelvis with left lower abdominal pain, epigastric pain, rule out diverticulitis, colitis, malignancy, etc. -Check lipase, ER precautions discussed with the patient and his wife. -Levsin as needed  COPD, very severe (HCC) On 3 L portable, wheelchair-bound, severe COPD, too high risk for endoscopic procedures and less life-threatening.  Chronic diastolic congestive heart failure (HCC) 2023 EF 50%, grade 1 diastolic dysfunction   Patient Care Team: Pcp, No as PCP - [facility], Alben Spittle, MD as PCP - Cardiology (Cardiology)  HISTORY OF PRESENT ILLNESS: 62 y.o. male with a past medical history of HFpEF, COPD with chronic hypoxia on oxygen, tobacco abuse, hypertension, history of substance abuse and others listed below presents for evaluation of Ab pain.   2022 EGD with dilation at [facility] by Dr. Jennye Boroughs, previous EGD 2020 did not show Candida esophagitis, patient's never had colonoscopy. 03/17/2022 office visit with Dr. Chales Abrahams for GERD, patient was restarted on Protonix 40 mg daily, no endoscopic procedure secondary to frail pulmonary status and recent hospitalization  January 2023 for CHF/COPD  Presents today for AB pain in wheel chair, walks very little at home due to SOB, on 3L portal O2. Continues to smoke.  He has had lower AB pain, intermittent cramping/sharp, can radiate around to his back and up towards his chest, can go 3-5 day and then will not be there for several weeks.  Can be associated with nausea, no vomiting.  He states he has had more constipation than he use to, can be 2-3 days without a BM, and then loose stools. Can have hard stools.  Denies fever, can have intermittent chills.  Worse sitting up, better lying down.  He has had AB bloating and decreased appetite.  Having Bm's does not help pain. Can try mylanta, tums, pepto, tyelnol and nothing helps. Has not tried heating pad.  He has lost weight but not recent.   He is on protonix 40 only once a day.  He is on prednisone 15 mg once daily for 6 months.  He will take a goody powder 1-2 a day, once a week or a few times a week, has been taking more frequently, 2-3 weeks ago.  He denies blood thinner use.  He reports ETOH use, 2-3 coors light a day.  He denies drug use.    Past GI work-up: -In addition to above EGD (report awaited) -Refused colonoscopy in past.  Never had one.  EGD 04/2019 -Candida esophagitis (biopsied). -Mild gastritis -S/p empiric esophageal dilatation.   Ba swallow 04/2019 1. No esophageal stricture, mass, or mucosal abnormality identified. 2. Large volume gastroesophageal reflux. 3. Patent GE junction with barium tablet passing without difficulty.  He  reports that he has been smoking cigarettes. He has a 40 pack-year  smoking history. He has been exposed to tobacco smoke. He has never used smokeless tobacco. He reports current alcohol use of about 6.0 standard drinks of alcohol per week. He reports that he does not currently use drugs after having used the following drugs: "Crack" cocaine.  RELEVANT LABS AND IMAGING: CBC    Component Value Date/Time   WBC  8.3 05/17/2022 1547   WBC 10.5 04/17/2022 1517   RBC 4.56 05/17/2022 1547   RBC 4.44 04/17/2022 1517   HGB 13.5 05/17/2022 1547   HCT 42.0 05/17/2022 1547   PLT 261 05/17/2022 1547   MCV 92 05/17/2022 1547   MCH 29.6 05/17/2022 1547   MCH 30.7 12/27/2021 0330   MCHC 32.1 05/17/2022 1547   MCHC 31.2 04/17/2022 1517   RDW 15.8 (H) 05/17/2022 1547   LYMPHSABS 0.6 (L) 04/17/2022 1517   LYMPHSABS 1.1 01/06/2022 1446   MONOABS 0.6 04/17/2022 1517   EOSABS 0.0 04/17/2022 1517   EOSABS 0.0 01/06/2022 1446   BASOSABS 0.0 04/17/2022 1517   BASOSABS 0.1 01/06/2022 1446   No results for input(s): "HGB" in the last 8760 hours.  CMP     Component Value Date/Time   NA 143 07/20/2022 1615   K 5.0 07/20/2022 1615   CL 97 07/20/2022 1615   CO2 31 (H) 07/20/2022 1615   GLUCOSE 99 07/20/2022 1615   GLUCOSE 68 (L) 04/27/2022 1655   BUN 17 07/20/2022 1615   CREATININE 1.02 07/20/2022 1615   CALCIUM 9.2 07/20/2022 1615   PROT 6.6 02/20/2022 1157   ALBUMIN 4.2 02/20/2022 1157   AST 13 02/20/2022 1157   ALT 9 02/20/2022 1157   ALKPHOS 94 02/20/2022 1157   BILITOT 0.6 02/20/2022 1157   GFRNONAA >60 12/29/2021 0343   GFRAA >60 02/10/2019 1138      Latest Ref Rng & Units 02/20/2022   11:57 AM 01/06/2022    2:46 PM 12/26/2021   10:56 AM  Hepatic Function  Total Protein 6.0 - 8.5 g/dL 6.6  6.6  6.4   Albumin 3.8 - 4.9 g/dL 4.2  4.3  3.3   AST 0 - 40 IU/L 13  24  28    ALT 0 - 44 IU/L 9  19  15    Alk Phosphatase 44 - 121 IU/L 94  78  67   Total Bilirubin 0.0 - 1.2 mg/dL 0.6  0.3  0.5       Current Medications:   Current Outpatient Medications (Endocrine & Metabolic):    predniSONE (DELTASONE) 10 MG tablet, TAKE 1 TABLET (10 MG TOTAL) BY MOUTH DAILY WITH BREAKFAST.   predniSONE (DELTASONE) 5 MG tablet, Take 3 tablets (15 mg total) by mouth daily with breakfast.  Current Outpatient Medications (Cardiovascular):    amLODipine (NORVASC) 10 MG tablet, Take 1 tablet (10 mg total) by mouth  daily.   atorvastatin (LIPITOR) 20 MG tablet, Take 1 tablet (20 mg total) by mouth daily.   carvedilol (COREG) 25 MG tablet, Take 1 tablet (25 mg total) by mouth 2 (two) times daily.   torsemide (DEMADEX) 20 MG tablet, TAKE 1 TABLET(20 MG) BY MOUTH DAILY  Current Outpatient Medications (Respiratory):    albuterol (PROVENTIL) (2.5 MG/3ML) 0.083% nebulizer solution, Take 3 mLs (2.5 mg total) by nebulization every 6 (six) hours as needed for wheezing or shortness of breath.   albuterol (VENTOLIN HFA) 108 (90 Base) MCG/ACT inhaler, Inhale 2 puffs into the lungs every 6 (six) hours as needed for wheezing or shortness of breath.   ipratropium-albuterol (  DUONEB) 0.5-2.5 (3) MG/3ML SOLN, Take 3 mLs by nebulization in the morning, at noon, in the evening, and at bedtime.   YUPELRI 175 MCG/3ML nebulizer solution, TAKE 3 MLS (175 MCG TOTAL) BY NEBULIZATION DAILY.  Current Outpatient Medications (Analgesics):    acetaminophen (TYLENOL) 325 MG tablet, Take 650 mg by mouth every 6 (six) hours as needed.   Current Outpatient Medications (Other):    hyoscyamine (LEVSIN SL) 0.125 MG SL tablet, Place 1 tablet (0.125 mg total) under the tongue every 6 (six) hours as needed for cramping (nausea, diarrhea).   OXYGEN, Inhale 3-4 L into the lungs.   pantoprazole (PROTONIX) 40 MG tablet, Take 1 tablet (40 mg total) by mouth 2 (two) times daily before a meal. Take 30 minutes before breakfast  Medical History:  Past Medical History:  Diagnosis Date   Acute respiratory failure with hypoxia (HCC)    Anxiety    Arthritis    hands   CAP (community acquired pneumonia)    Cataract    bilateral-removed   COPD (chronic obstructive pulmonary disease) (HCC)    Dyspnea    with exertion   Emphysema of lung (HCC)    GERD (gastroesophageal reflux disease)    Hypertension    Oxygen deficiency    Pneumonia    Substance use disorder    pt.denies as of 01/25/22   Tobacco use    Allergies: No Known Allergies    Surgical History:  He  has a past surgical history that includes Back surgery; Rotator cuff repair (Right); Cyst excision; Video bronchoscopy with endobronchial ultrasound (N/A, 02/12/2019); Esophageal dilation; Upper gastrointestinal endoscopy; and cataracts (Bilateral). Family History:  His family history includes Diabetes in his mother; Heart disease in his mother; Hypertension in his father and mother.  REVIEW OF SYSTEMS  : All other systems reviewed and negative except where noted in the History of Present Illness.  PHYSICAL EXAM: BP (!) 160/74   Pulse 77   Ht 5\' 11"  (1.803 m)   Wt 136 lb (61.7 kg)   SpO2 99% Comment: 3 Lts  BMI 18.97 kg/m  General Appearance: Thin/cachetic,  in no apparent distress. Head:   Normocephalic and atraumatic. Eyes:  sclerae anicteric,conjunctive pink  Respiratory: barrel chest, decreased diffuse breath sounds, on 3 L O2 Cardio: RRR with no MRGs. Peripheral pulses intact.  Abdomen: Soft,  Flat ,active bowel sounds. mild tenderness in the epigastrium. Without guarding and Without rebound. No masses. Rectal: declines Musculoskeletal: Full ROM, Not tested gait, in wheel chair. With edema. Skin: Bilateral ecchymosis Neuro: Alert and  oriented x4;  No focal deficits. Psych:  Cooperative. Normal mood and affect.    Doree Albee, PA-C 3:46 PM

## 2023-06-27 ENCOUNTER — Other Ambulatory Visit (HOSPITAL_COMMUNITY): Payer: Self-pay

## 2023-06-27 ENCOUNTER — Other Ambulatory Visit (INDEPENDENT_AMBULATORY_CARE_PROVIDER_SITE_OTHER): Payer: Commercial Managed Care - HMO

## 2023-06-27 ENCOUNTER — Telehealth: Payer: Self-pay | Admitting: Pharmacy Technician

## 2023-06-27 ENCOUNTER — Ambulatory Visit: Payer: Commercial Managed Care - HMO | Admitting: Physician Assistant

## 2023-06-27 VITALS — BP 160/74 | HR 77 | Ht 71.0 in | Wt 136.0 lb

## 2023-06-27 DIAGNOSIS — K219 Gastro-esophageal reflux disease without esophagitis: Secondary | ICD-10-CM

## 2023-06-27 DIAGNOSIS — R110 Nausea: Secondary | ICD-10-CM | POA: Diagnosis not present

## 2023-06-27 DIAGNOSIS — R1032 Left lower quadrant pain: Secondary | ICD-10-CM

## 2023-06-27 DIAGNOSIS — F17210 Nicotine dependence, cigarettes, uncomplicated: Secondary | ICD-10-CM

## 2023-06-27 DIAGNOSIS — R198 Other specified symptoms and signs involving the digestive system and abdomen: Secondary | ICD-10-CM

## 2023-06-27 DIAGNOSIS — J449 Chronic obstructive pulmonary disease, unspecified: Secondary | ICD-10-CM

## 2023-06-27 DIAGNOSIS — R197 Diarrhea, unspecified: Secondary | ICD-10-CM | POA: Diagnosis not present

## 2023-06-27 DIAGNOSIS — I5032 Chronic diastolic (congestive) heart failure: Secondary | ICD-10-CM

## 2023-06-27 DIAGNOSIS — K5900 Constipation, unspecified: Secondary | ICD-10-CM

## 2023-06-27 LAB — COMPREHENSIVE METABOLIC PANEL
ALT: 22 U/L (ref 0–53)
AST: 19 U/L (ref 0–37)
Albumin: 4.6 g/dL (ref 3.5–5.2)
Alkaline Phosphatase: 74 U/L (ref 39–117)
BUN: 12 mg/dL (ref 6–23)
CO2: 41 mEq/L — ABNORMAL HIGH (ref 19–32)
Calcium: 9.5 mg/dL (ref 8.4–10.5)
Chloride: 90 mEq/L — ABNORMAL LOW (ref 96–112)
Creatinine, Ser: 0.81 mg/dL (ref 0.40–1.50)
GFR: 95.17 mL/min (ref >60.00)
Glucose, Bld: 94 mg/dL (ref 70–99)
Potassium: 4 mEq/L (ref 3.5–5.1)
Sodium: 141 mEq/L (ref 135–145)
Total Bilirubin: 0.8 mg/dL (ref 0.2–1.2)
Total Protein: 7.8 g/dL (ref 6.0–8.3)

## 2023-06-27 LAB — CBC WITH DIFFERENTIAL/PLATELET
Basophils Absolute: 0.1 10*3/uL (ref 0.0–0.1)
Basophils Relative: 0.5 % (ref 0.0–3.0)
Eosinophils Absolute: 0 10*3/uL (ref 0.0–0.7)
Eosinophils Relative: 0.1 % (ref 0.0–5.0)
HCT: 40.4 % (ref 39.0–52.0)
Hemoglobin: 12.9 g/dL — ABNORMAL LOW (ref 13.0–17.0)
Lymphocytes Relative: 11.4 % — ABNORMAL LOW (ref 12.0–46.0)
Lymphs Abs: 1.4 10*3/uL (ref 0.7–4.0)
MCHC: 32 g/dL (ref 30.0–36.0)
MCV: 104.5 fl — ABNORMAL HIGH (ref 78.0–100.0)
Monocytes Absolute: 1 10*3/uL (ref 0.1–1.0)
Monocytes Relative: 8.2 % (ref 3.0–12.0)
Neutro Abs: 9.8 10*3/uL — ABNORMAL HIGH (ref 1.4–7.7)
Neutrophils Relative %: 79.8 % — ABNORMAL HIGH (ref 43.0–77.0)
Platelets: 257 10*3/uL (ref 150.0–400.0)
RBC: 3.87 Mil/uL — ABNORMAL LOW (ref 4.22–5.81)
RDW: 15.6 % — ABNORMAL HIGH (ref 11.5–15.5)
WBC: 12.3 10*3/uL — ABNORMAL HIGH (ref 4.0–10.5)

## 2023-06-27 LAB — TSH: TSH: 23.18 u[IU]/mL — ABNORMAL HIGH (ref 0.35–5.50)

## 2023-06-27 LAB — LIPASE: Lipase: 8 U/L — ABNORMAL LOW (ref 11.0–59.0)

## 2023-06-27 MED ORDER — PANTOPRAZOLE SODIUM 40 MG PO TBEC: 40.0000 mg | DELAYED_RELEASE_TABLET | Freq: Two times a day (BID) | ORAL | 5 refills | Status: DC

## 2023-06-27 MED ORDER — HYOSCYAMINE SULFATE 0.125 MG SL SUBL: 0.1250 mg | SUBLINGUAL_TABLET | Freq: Four times a day (QID) | SUBLINGUAL | 1 refills | Status: DC | PRN

## 2023-06-27 NOTE — Telephone Encounter (Signed)
Pharmacy Patient Advocate Encounter  Received notification from [employer] that Prior Authorization for PANTOPRAZOLE 40MG  has been APPROVED from 7.24.24 to 7.24.25. Ran test claim, Copay is $5.18 for 90-day.  PA #/Case ID/Reference #: [id number]

## 2023-06-27 NOTE — Patient Instructions (Addendum)
Your provider has ordered "Diathe[employer]" stool testing for you. You have received a kit from our office today containing all necessary supplies to complete this test. Please carefully read the stool collection instructions provided in the kit before opening the accompanying materials. In addition, be sure to place the label from the top left corner of the laboratory request sheet onto the "puritan opti-swab" tube that is supplied in the kit. This label should include your full name and date of birth. After completing the test, you should secure the purtian tube into the specimen biohazard bag. The laboratory request information sheet (including date and time of specimen collection) should be placed into the outside pocket of the specimen biohazard bag and returned to the [place] lab with 2 days of collection.    You have been scheduled for a CT scan of the abdomen and pelvis at [facility], 1st floor Radiology. You are scheduled on 07/25/2023 at 11:45am. You should arrive 15 minutes prior to your appointment time for registration.  We are giving you 2 bottles of contrast today that you will need to drink before arriving for the exam. The solution may taste better if refrigerated so put them in the refrigerator when you get home, but do NOT add ice or any other liquid to this solution as that would dilute it. Shake well before drinking.   Please follow the written instructions below on the day of your exam:   1) Do not eat anything 4 hours prior to your test   You may take any medications as prescribed with a small amount of water, if necessary. If you take any of the following medications: METFORMIN, GLUCOPHAGE, GLUCOVANCE, AVANDAMET, RIOMET, FORTAMET, ACTOPLUS MET, JANUMET, GLUMETZA or METAGLIP, you MAY be asked to HOLD this medication 48 hours AFTER the exam.   The purpose of you drinking the oral contrast is to aid in the visualization of your intestinal tract. The contrast solution may cause  some diarrhea. Depending on your individual set of symptoms, you may also receive an intravenous injection of x-ray contrast/dye. Plan on being at [facility] for 45 minutes or longer, depending on the type of exam you are having performed.   If you have any questions regarding your exam or if you need to reschedule, you may call [facility] at [phone] between the hours of 8:00 am and 5:00 pm, Monday-Friday.    Your provider has requested that you have an abdominal x ray before leaving today. Please go to the basement floor to our Radiology department for the test. Your provider has requested that you go to the basement level for lab work before leaving today. Press "B" on the elevator. The lab is located at the first door on the left as you exit the elevator.  Please take your proton pump inhibitor medication, increase protonix to twice a day  Please take this medication 30 minutes to 1 hour before meals- this makes it more effective.  Avoid spicy and acidic foods Avoid fatty foods Limit your intake of coffee, tea, alcohol, and carbonated drinks Work to maintain a healthy weight Keep the head of the bed elevated at least 3 inches with blocks or a wedge pillow if you are having any nighttime symptoms Stay upright for 2 hours after eating Avoid meals and snacks three to four hours before bedtime Stop smoking  STOP GOODY POWDERS  First do a trial off milk/lactose products if you use them.  Add fiber like benefiber or citracel  once a day Increase activity Can do trial of IBGard which is over the counter for AB pain- Take 1-2 capsules once a day for maintence or twice a day during a flare Can send in an anti spasm medication, Levsin, to take as needed Please try to decrease stress. consider talking with PCP about anti anxiety medication or try head space app for meditation. if any worsening symptoms like blood in stool, weight loss, please call the office     FODMAP stands  for fermentable oligo-, di-, mono-saccharides and polyols (1). These are the scientific terms used to classify groups of carbs that are difficult for our body to digest and that are notorious for triggering digestive symptoms like bloating, gas, loose stools and stomach pain.   You can try low FODMAP diet  - start with eliminating just one column at a time that you feel may be a trigger for you. - the table at the very bottom contains foods that are low in FODMAPs   Sometimes trying to eliminate the FODMAP's from your diet is difficult or tricky, if you are stuggling with trying to do the elimination diet you can try an enzyme.  There is a food enzymes that you sprinkle in or on your food that helps break down the FODMAP. You can read more about the enzyme by going to this site: https://fodzyme.com/   _______________________________________________________  If your blood pressure at your visit was 140/90 or greater, please contact your primary care physician to follow up on this.  _______________________________________________________  If you are age 61 or older, your body mass index should be between 23-30. Your Body mass index is 18.97 kg/m. If this is out of the aforementioned range listed, please consider follow up with your Primary Care Provider.  If you are age 69 or younger, your body mass index should be between 19-25. Your Body mass index is 18.97 kg/m. If this is out of the aformentioned range listed, please consider follow up with your Primary Care Provider.   ________________________________________________________  The [facility] providers would like to encourage you to use [facility] to communicate with providers for non-urgent requests or questions.  Due to long hold times on the telephone, sending your provider a message by [facility] may be a faster and more efficient way to get a response.  Please allow 48 business hours for a response.  Please remember that this is for  non-urgent requests.  _______________________________________________________ It was a pleasure to see you today!  Thank you for trusting me with your gastrointestinal care!

## 2023-06-27 NOTE — Telephone Encounter (Signed)
Pharmacy Patient Advocate Encounter   Received notification from Fax that prior authorization for PANTOPRAZOLE 40MG  is required/requested.   Insurance verification completed.   The patient is insured through [employer] .   Per test claim: PA submitted to [employer] via CoverMyMeds Key/confirmation #/[id number] Status is pending

## 2023-06-28 ENCOUNTER — Other Ambulatory Visit (INDEPENDENT_AMBULATORY_CARE_PROVIDER_SITE_OTHER): Payer: Commercial Managed Care - HMO

## 2023-06-28 DIAGNOSIS — D509 Iron deficiency anemia, unspecified: Secondary | ICD-10-CM | POA: Diagnosis not present

## 2023-06-28 DIAGNOSIS — D519 Vitamin B12 deficiency anemia, unspecified: Secondary | ICD-10-CM | POA: Diagnosis not present

## 2023-06-28 LAB — IBC + FERRITIN
Ferritin: 103.3 ng/mL (ref 22.0–322.0)
Iron: 116 ug/dL (ref 42–165)
Saturation Ratios: 22.8 % (ref 20.0–50.0)
TIBC: 508.2 ug/dL — ABNORMAL HIGH (ref 250.0–450.0)
Transferrin: 363 mg/dL — ABNORMAL HIGH (ref 212.0–360.0)

## 2023-06-28 LAB — VITAMIN B12: Vitamin B-12: 477 pg/mL (ref 211–911)

## 2023-06-28 NOTE — Progress Notes (Signed)
Agree with assessment/plan.  Raj Gupta, MD [facility] [phone]  

## 2023-06-29 ENCOUNTER — Encounter: Payer: Self-pay | Admitting: Nurse Practitioner

## 2023-06-29 ENCOUNTER — Ambulatory Visit: Payer: Medicaid Other

## 2023-06-29 ENCOUNTER — Telehealth: Payer: Self-pay

## 2023-06-29 ENCOUNTER — Ambulatory Visit: Payer: Commercial Managed Care - HMO | Admitting: Nurse Practitioner

## 2023-06-29 VITALS — BP 160/80 | HR 66 | Ht 70.0 in | Wt 139.2 lb

## 2023-06-29 DIAGNOSIS — J449 Chronic obstructive pulmonary disease, unspecified: Secondary | ICD-10-CM | POA: Diagnosis not present

## 2023-06-29 DIAGNOSIS — J9611 Chronic respiratory failure with hypoxia: Secondary | ICD-10-CM | POA: Diagnosis not present

## 2023-06-29 DIAGNOSIS — Z720 Tobacco use: Secondary | ICD-10-CM

## 2023-06-29 DIAGNOSIS — I5032 Chronic diastolic (congestive) heart failure: Secondary | ICD-10-CM

## 2023-06-29 LAB — BASIC METABOLIC PANEL
BUN: 14 mg/dL (ref 6–23)
CO2: 37 mEq/L — ABNORMAL HIGH (ref 19–32)
Calcium: 9.7 mg/dL (ref 8.4–10.5)
Chloride: 96 mEq/L (ref 96–112)
Creatinine, Ser: 0.76 mg/dL (ref 0.40–1.50)
GFR: 97.02 mL/min (ref >60.00)
Glucose, Bld: 99 mg/dL (ref 70–99)
Potassium: 4.9 mEq/L (ref 3.5–5.1)
Sodium: 141 mEq/L (ref 135–145)

## 2023-06-29 LAB — BRAIN NATRIURETIC PEPTIDE: Pro B Natriuretic peptide (BNP): 75 pg/mL (ref 0.0–100.0)

## 2023-06-29 MED ORDER — ALBUTEROL SULFATE HFA 108 (90 BASE) MCG/ACT IN AERS
2.0000 | INHALATION_SPRAY | Freq: Four times a day (QID) | RESPIRATORY_TRACT | 3 refills | Status: DC | PRN
Start: 2023-06-29 — End: 2023-07-07

## 2023-06-29 NOTE — Telephone Encounter (Signed)
Called patient to let him know we canceled xray at [facility] and referred him to [facility] to get it sooner, patients phone went to VM and was unable to leave VM

## 2023-06-29 NOTE — Assessment & Plan Note (Signed)
Smoking cessation again advised.  He has lung cancer screening CT scheduled August 2024.

## 2023-06-29 NOTE — Assessment & Plan Note (Signed)
Clinically improved per his report.  He does have some mild pitting pedal edema.  Will check BNP and BMET.  May have him increase his torsemide for the next few days depending on results.  Follow-up with cardiology as scheduled.  Advised to monitor his weights at home and notify them if he has increased weight of 2 to 3 pounds overnight or 5 pounds in a week.

## 2023-06-29 NOTE — Progress Notes (Signed)
@Patient  ID: Sean Hull, male    DOB: [birth year], 62 y.o.   MRN: [id number]  Chief Complaint  Patient presents with   Follow-up    Reestablishing for o2.    Referring provider: No ref. provider found  HPI: 62 year old male, current everyday smoker (60 pack years, 1.5 pack/day) followed for COPD with emphysema and chronic respiratory failure.  He is a patient of Dr. Kavin Hull and last seen in office 05/02/2023.  Past medical history significant for mediastinal lymphadenopathy, coronary artery calcification, hypertension, CHF, history of Pseudomonas infection.  TEST/EVENTS:  12/26/2021 CTA chest: No evidence of PE.  There is mild diffuse thickening of the esophagus.  There is a left paratracheal node 1.1 cm.  Right hilar node 1.1 cm.  Both suspected to be reactive.  Clustered AP window lymph nodes.  Similar to prior.  There is biapical pleural-parenchymal scarring.  Prominent emphysema is present.  There is improved aeration in the right middle lobe, although a small amount of airspace opacity persists.  There is an 8 x 7 mm right lower lobe pulmonary nodule, slightly decreased in size from previous scan in 2020.  Some improvement of the adjacent branching nodularity in the left lower lobe previously seen.  There is upper abdominal ascites and mesenteric edema.  Substantial subcutaneous edema along the upper abdomen. 12/27/2021 echocardiogram: EF 50%.  G1 DD.  Unable to measure PASP.  Trivial MR. 04/19/2022 CTA chest: no PE. RML consolidation. Interstitial thickening. 10 mm nodule in RLL, unchanged from 2021 imaging. Reactive LAD.  08/03/2022 Super D CT chest: atherosclerosis. Enlarged pulmonic trunk. Severe emphysema. Scattered parenchymal scarring. 10 mm RLL nodule stable. No new nodules. Previous consolidation resolved. Right adrenal adenoma.   09/20/2022: OV with Dr. Delton Hull. Some benefit from prednisone. Functional capacity slightly better. Will try decreasing to 15 mg daily and stay at this  dose. Smoking cessation again advised. Pulmonary nodule stable for over 2 years, deemed benign. We will consider referring him to the lung cancer screening program going forward. Given the severity of his disease he may not be a good candidate for any diagnostics if a nodule was found.   12/21/2022: OV with Sean Jourdan NP for follow up with his sister. He has been doing ok since he was here last. Hasn't required any steroids or abx. No hospitalizations. He did run out of his Breztri 3 weeks ago and insurance has told him that it's no longer covered. Since then, he's felt more short winded especially over the past few days. Has noticed more wheezing too. Using his rescue multiple times a day. He has not had any change in his chronic cough. No increased chest congestion, fevers, chills, hemoptysis. He is on prednisone 15 mg daily.  He had elevated BP in office today. Upon recheck was still 180/90. Upon review of his chart, his PCP discontinued his losartan and started him on metoprolol in September. There was note of tachycardia but HR was documented at 84 and he doesn't recall this. Since then, his pressures have been running on the higher side. He hasn't been back to see cardiology. He denies headaches, N/V, dizziness, syncope, chest pain, palpitations. Admits compliance to his metoprolol and fluid pills. No leg swelling, weight gain, or orthopnea today.   02/02/2023: OV with Sean Weaber NP for follow up with his sister. Since our last visit, he continued to struggle with his blood pressure. He saw cardiology who adjusted his antihypertensives earlier today. He's also been off of his maintenance inhaler. He was  supposed to change to Trelegy due to insurance but never did. Feels like over the past week, he's been more short winded. Denies increased cough, chest congestion, or wheezing. No leg swelling. He has been using his rescue inhaler and nebulizer.   03/22/2023: OV with Sean Holst NP for follow-up with his sister.  At her last  visit, we restarted his Trelegy inhaler.  He has had multiple issues with his insurance covering this.  Medicaid denied a prior authorization and even though it shows that this is covered on his [employer] plan, he was going to be charged for $600 for it.  He has been able to receive samples from our office so has been using these over the past couple of weeks.  Feels like his breathing is at his baseline when compared to the past 6 months as long as he has some form of maintenance inhaler. Doesn't notice much difference between Trelegy and Breztri.  Still has high symptom burden and gets winded with minimal activity.  Cough is unchanged and at his baseline.  Denies any increased production or frequency.  He is not having much chest congestion or wheezing.  Blood pressure seems to be better controlled recently not having any leg swelling.  Does need a refill of his rescue inhaler.  He uses his neb treatments at home.  These seem to work quite well for him.  He continues to smoke 1 pack/day. Last CT in August 2023 showed a stable 10 mm right lower lobe lung nodule, that was definitively considered benign.  05/02/2023: OV with Dr. Delton Hull.  Debilitating COPD.  Prednisone dependent, currently on 50 mg.  Had tried to change him to long-acting nebulized regimen but he is unable to obtain due to insurance constraints and obstacles.  Has been using leftover Trelegy in the interim.  Will trial changing him to scheduled DuoNeb 4 times a day.  Continue albuterol as rescue.  Continue the oral corticosteroid.  Hold off on ICS for now since he is taking the prednisone.  He knows that he needs to stop smoking but he has been unable to.  Again reviewed.  Follow-up in 1 month to ensure that he gets on the nebulized medicines.  Can make the decision to increase from 15-20 at that time.  06/29/2023: Today-follow-up Patient presents today for follow-up with his sister.  He was told by his medical supply company that he also needed to  have requalification for oxygen.  He tells me today that he is actually feeling better since his last visit.  Feels like he is benefiting from doing the DuoNebs 4 times a day.  He has not had to use his rescue inhaler very often.  Still feels like he is limited by his breathing but is able to do a little bit more at his house without getting his winded so quickly.  No significant cough or chest congestion.  Not noticing much wheezing.  He has having a little bit more leg swelling, which she had contacted [place] about earlier this month.  It has improved and he also feels like his breathing is better.  He is taking his torsemide once a day.  Denies any orthopnea, PND, chest pain.  No increased O2 requirement.  Still smoking about 10 cigarettes a day.  No Known Allergies  Immunization History  Administered Date(s) Administered   Influenza,inj,Quad PF,6+ Mos 11/10/2019, 12/29/2021, 09/20/2022   Influenza-Unspecified 11/10/2019   Pneumococcal Polysaccharide-23 11/10/2019    Past Medical History:  Diagnosis Date  Acute respiratory failure with hypoxia (HCC)    Anxiety    Arthritis    hands   CAP (community acquired pneumonia)    Cataract    bilateral-removed   COPD (chronic obstructive pulmonary disease) (HCC)    Dyspnea    with exertion   Emphysema of lung (HCC)    GERD (gastroesophageal reflux disease)    Hypertension    Oxygen deficiency    Pneumonia    Substance use disorder    pt.denies as of 01/25/22   Tobacco use     Tobacco History: Social History   Tobacco Use  Smoking Status Every Day   Current packs/day: 1.00   Average packs/day: 1 pack/day for 40.0 years (40.0 ttl pk-yrs)   Types: Cigarettes   Passive exposure: Past  Smokeless Tobacco Never  Tobacco Comments   0.5-1ppd  05/02/23 ARJ    Ready to quit: Not Answered Counseling given: Not Answered Tobacco comments: 0.5-1ppd  05/02/23 ARJ    Outpatient Medications Prior to Visit  Medication Sig Dispense Refill    acetaminophen (TYLENOL) 325 MG tablet Take 650 mg by mouth every 6 (six) hours as needed.     albuterol (PROVENTIL) (2.5 MG/3ML) 0.083% nebulizer solution Take 3 mLs (2.5 mg total) by nebulization every 6 (six) hours as needed for wheezing or shortness of breath. 75 mL 12   amLODipine (NORVASC) 10 MG tablet Take 1 tablet (10 mg total) by mouth daily. 90 tablet 3   atorvastatin (LIPITOR) 20 MG tablet Take 1 tablet (20 mg total) by mouth daily. 90 tablet 3   carvedilol (COREG) 25 MG tablet Take 1 tablet (25 mg total) by mouth 2 (two) times daily. 180 tablet 3   hyoscyamine (LEVSIN SL) 0.125 MG SL tablet Place 1 tablet (0.125 mg total) under the tongue every 6 (six) hours as needed for cramping (nausea, diarrhea). 50 tablet 1   ipratropium-albuterol (DUONEB) 0.5-2.5 (3) MG/3ML SOLN Take 3 mLs by nebulization in the morning, at noon, in the evening, and at bedtime. 360 mL 3   OXYGEN Inhale 3-4 L into the lungs.     pantoprazole (PROTONIX) 40 MG tablet Take 1 tablet (40 mg total) by mouth 2 (two) times daily before a meal. Take 30 minutes before breakfast 90 tablet 5   predniSONE (DELTASONE) 10 MG tablet TAKE 1 TABLET (10 MG TOTAL) BY MOUTH DAILY WITH BREAKFAST. 30 tablet 2   predniSONE (DELTASONE) 5 MG tablet Take 3 tablets (15 mg total) by mouth daily with breakfast. 90 tablet 1   torsemide (DEMADEX) 20 MG tablet TAKE 1 TABLET(20 MG) BY MOUTH DAILY 90 tablet 3   YUPELRI 175 MCG/3ML nebulizer solution TAKE 3 MLS (175 MCG TOTAL) BY NEBULIZATION DAILY. 90 mL 11   albuterol (VENTOLIN HFA) 108 (90 Base) MCG/ACT inhaler Inhale 2 puffs into the lungs every 6 (six) hours as needed for wheezing or shortness of breath. 18 g 3   No facility-administered medications prior to visit.     Review of Systems:   Constitutional: No weight loss or gain, night sweats, fevers, chills +fatigue (baseline) HEENT: No headaches, difficulty swallowing, tooth/dental problems, or sore throat. No sneezing, itching, ear ache,  nasal congestion, or post nasal drip CV:  +swelling lower extremities. No chest pain, orthopnea, PND, anasarca, dizziness, palpitations, syncope Resp: +shortness of breath with exertion.  No cough.  No wheeze.   No hemoptysis.  No chest wall deformity GI:  No heartburn, indigestion, abdominal pain, nausea, vomiting, diarrhea, change in bowel habits,  loss of appetite, bloody stools.  GU: No dysuria, change in color of urine, urgency or frequency. Skin: No rash, lesions, ulcerations MSK:  No joint pain or swelling.   Neuro: No dizziness or lightheadedness.  Psych: No depression or anxiety. Mood stable.     Physical Exam:  BP (!) 160/80   Pulse 66   Ht 5\' 10"  (1.778 m)   Wt 139 lb 3.2 oz (63.1 kg)   SpO2 91%   BMI 19.97 kg/m   GEN: Pleasant, interactive, chronically-ill appearing; in no acute distress. HEENT:  Normocephalic and atraumatic. PERRLA. Sclera white. Nasal turbinates pink, moist and patent bilaterally. No rhinorrhea present. Oropharynx pink and moist, without exudate or edema. No lesions, ulcerations, or postnasal drip.  NECK:  Supple w/ fair ROM. No JVD present. Normal carotid impulses w/o bruits. Thyroid symmetrical with no goiter or nodules palpated. No lymphadenopathy.   CV: RRR, no m/r/g, +1 pedal edema. Pulses intact, +2 bilaterally. No cyanosis, pallor or clubbing. PULMONARY: Unlabored, regular. Diminished bilaterally A&P without wheezes/rales/rhonchi. No accessory muscle use. No dullness to percussion. GI: BS present and normoactive. Soft, non-tender to palpation.  MSK: No erythema, warmth or tenderness. Cap refil <2 sec all extrem. No deformities or joint swelling noted. Muscle wasting Neuro: A/Ox3. No focal deficits noted.   Skin: Warm, no lesions or rashe Psych: Normal affect and behavior. Judgement and thought content appropriate.     Lab Results:  CBC    Component Value Date/Time   WBC 12.3 (H) 06/27/2023 1604   RBC 3.87 (L) 06/27/2023 1604   HGB 12.9  (L) 06/27/2023 1604   HGB 13.5 05/17/2022 1547   HCT 40.4 06/27/2023 1604   HCT 42.0 05/17/2022 1547   PLT 257.0 06/27/2023 1604   PLT 261 05/17/2022 1547   MCV 104.5 (H) 06/27/2023 1604   MCV 92 05/17/2022 1547   MCH 29.6 05/17/2022 1547   MCH 30.7 12/27/2021 0330   MCHC 32.0 06/27/2023 1604   RDW 15.6 (H) 06/27/2023 1604   RDW 15.8 (H) 05/17/2022 1547   LYMPHSABS 1.4 06/27/2023 1604   LYMPHSABS 1.1 01/06/2022 1446   MONOABS 1.0 06/27/2023 1604   EOSABS 0.0 06/27/2023 1604   EOSABS 0.0 01/06/2022 1446   BASOSABS 0.1 06/27/2023 1604   BASOSABS 0.1 01/06/2022 1446    BMET    Component Value Date/Time   NA 141 06/29/2023 1513   NA 143 07/20/2022 1615   K 4.9 06/29/2023 1513   CL 96 06/29/2023 1513   CO2 37 (H) 06/29/2023 1513   GLUCOSE 99 06/29/2023 1513   BUN 14 06/29/2023 1513   BUN 17 07/20/2022 1615   CREATININE 0.76 06/29/2023 1513   CALCIUM 9.7 06/29/2023 1513   GFRNONAA >60 12/29/2021 0343   GFRAA >60 02/10/2019 1138    BNP    Component Value Date/Time   BNP 1,868.7 (H) 12/26/2021 1056     Imaging:  No results found.  Administration History     None            No data to display          No results found for: "NITRICOXIDE"      Assessment & Plan:   COPD, very severe (HCC) Very severe COPD with high symptom burden.  He appears to have some improvement with change to DuoNebs 4 times a day.  He was unable to afford long-acting nebulized medicines due to insurance constraints.  He is continued on 15 mg of prednisone daily.  Shared decision  to avoid further increase dose at this time.  Action plan in place.  Will continue to monitor his symptoms.  He will notify us if anything changes.  Patient Instructions  Continue Albuterol inhaler 2 puffs every 6 hours as needed for shortness of breath or wheezing. Notify if symptoms persist despite rescue inhaler/neb use. Continue supplemental oxygen 3-4 lpm for goal >88-90% Continue pantoprazole 1  tab daily Continue prednisone 15 mg daily in AM Continue duonebs 3 mL four times a day    Labs - BNP, BMET  I may increase your torsemide once I get these back.   Attend CT chest with lung cancer screening program as scheduled   Follow up in 3 months with Dr. Delton Hull or Philis Nettle. If symptoms do not improve or worsen, please contact office for sooner follow up or seek emergency care.    Chronic respiratory failure with hypoxia (HCC) Requalified for oxygen again today.  Unable to maintain saturations greater than 88 to 90% at rest or with exertion on room air.  He required 3 to 4 L/min supplemental O2.  CHF (congestive heart failure) (HCC) Clinically improved per his report.  He does have some mild pitting pedal edema.  Will check BNP and BMET.  May have him increase his torsemide for the next few days depending on results.  Follow-up with cardiology as scheduled.  Advised to monitor his weights at home and notify them if he has increased weight of 2 to 3 pounds overnight or 5 pounds in a week.  Tobacco abuse Smoking cessation again advised.  He has lung cancer screening CT scheduled August 2024.    I spent 35 minutes of dedicated to the care of this patient on the date of this encounter to include pre-visit review of records, face-to-face time with the patient discussing conditions above, post visit ordering of testing, clinical documentation with the electronic health record, making appropriate referrals as documented, and communicating necessary findings to members of the patients care team.  Noemi Chapel, NP 06/29/2023  Pt aware and understands NP's role.

## 2023-06-29 NOTE — Assessment & Plan Note (Signed)
Requalified for oxygen again today.  Unable to maintain saturations greater than 88 to 90% at rest or with exertion on room air.  He required 3 to 4 L/min supplemental O2.

## 2023-06-29 NOTE — Patient Instructions (Addendum)
Continue Albuterol inhaler 2 puffs every 6 hours as needed for shortness of breath or wheezing. Notify if symptoms persist despite rescue inhaler/neb use. Continue supplemental oxygen 3-4 lpm for goal >88-90% Continue pantoprazole 1 tab daily Continue prednisone 15 mg daily in AM Continue duonebs 3 mL four times a day    Labs - BNP, BMET  I may increase your torsemide once I get these back.   Attend CT chest with lung cancer screening program as scheduled   Follow up in 3 months with Dr. Delton Coombes or Philis Nettle. If symptoms do not improve or worsen, please contact office for sooner follow up or seek emergency care.

## 2023-06-29 NOTE — Assessment & Plan Note (Signed)
Very severe COPD with high symptom burden.  He appears to have some improvement with change to DuoNebs 4 times a day.  He was unable to afford long-acting nebulized medicines due to insurance constraints.  He is continued on 15 mg of prednisone daily.  Shared decision to avoid further increase dose at this time.  Action plan in place.  Will continue to monitor his symptoms.  He will notify us if anything changes.  Patient Instructions  Continue Albuterol inhaler 2 puffs every 6 hours as needed for shortness of breath or wheezing. Notify if symptoms persist despite rescue inhaler/neb use. Continue supplemental oxygen 3-4 lpm for goal >88-90% Continue pantoprazole 1 tab daily Continue prednisone 15 mg daily in AM Continue duonebs 3 mL four times a day    Labs - BNP, BMET  I may increase your torsemide once I get these back.   Attend CT chest with lung cancer screening program as scheduled   Follow up in 3 months with Dr. Delton Coombes or Philis Nettle. If symptoms do not improve or worsen, please contact office for sooner follow up or seek emergency care.

## 2023-07-02 NOTE — Progress Notes (Signed)
BNP was nl. He can take an additional torsemide for the next 3 days in the AM then return to his normal 20 mg (1 tab) daily. Thanks.

## 2023-07-05 ENCOUNTER — Encounter: Payer: Self-pay | Admitting: *Deleted

## 2023-07-05 ENCOUNTER — Telehealth: Payer: Self-pay | Admitting: Nurse Practitioner

## 2023-07-05 ENCOUNTER — Telehealth: Payer: Self-pay | Admitting: Internal Medicine

## 2023-07-06 NOTE — Telephone Encounter (Signed)
Erin with [facility] is returning call.

## 2023-07-06 NOTE — Telephone Encounter (Signed)
Erin with [facility] is calling needing confirmation today on if Dr. Wyline Mood will be signing the death certificate. Please advise.

## 2023-07-06 NOTE — Telephone Encounter (Signed)
Spoke with Harriett Sine and she aware is Dr. Wyline Mood have not responded as of yet. She did say another provider reached out and may sign.

## 2023-07-06 NOTE — Telephone Encounter (Signed)
Returned call to [facility] and was on hold for 10 mins. Will try again

## 2023-07-10 NOTE — Telephone Encounter (Signed)
Maisie Fus, MD  Cv Div Nl Triage; Jeb Levering M, RNJust now (10:06 AM)   MB I don't know the cause of death. He did not have a PCP unfortunately. However, I don't feel comfortable signing it.   Called Erin back with Dr. Verna Czech response. He verbalizes understanding.

## 2023-07-24 ENCOUNTER — Encounter: Payer: Self-pay | Admitting: Physician Assistant

## 2023-07-24 ENCOUNTER — Encounter: Payer: Medicaid Other | Admitting: Acute Care

## 2023-07-25 ENCOUNTER — Other Ambulatory Visit (HOSPITAL_COMMUNITY): Payer: Commercial Managed Care - HMO

## 2023-07-25 ENCOUNTER — Other Ambulatory Visit: Payer: Commercial Managed Care - HMO

## 2023-07-25 ENCOUNTER — Ambulatory Visit (HOSPITAL_COMMUNITY): Payer: Commercial Managed Care - HMO

## 2023-08-05 NOTE — Telephone Encounter (Signed)
[facility] is calling requesting Dr. Wyline Mood sign the patient's death certificate due to him not having a PCP. Patient passed on 07-25-23 at 16:23 hours. He reports he spoke with [facility] who recommended he also reach out to our office regarding it. Please advise.

## 2023-08-05 DEATH — deceased

## 2023-08-07 ENCOUNTER — Ambulatory Visit: Payer: Medicaid Other | Admitting: Internal Medicine

## 2023-10-01 ENCOUNTER — Ambulatory Visit: Payer: Commercial Managed Care - HMO | Admitting: Nurse Practitioner
